# Patient Record
Sex: Female | Born: 1945 | Race: White | Hispanic: No | Marital: Married | Smoking: Former smoker
Health system: Southern US, Community
[De-identification: ages and names within clinical notes are randomized; demographics above are authoritative.]

## PROBLEM LIST (undated history)

## (undated) DIAGNOSIS — M1990 Unspecified osteoarthritis, unspecified site: Secondary | ICD-10-CM

## (undated) DIAGNOSIS — K635 Polyp of colon: Secondary | ICD-10-CM

## (undated) DIAGNOSIS — F32A Depression, unspecified: Secondary | ICD-10-CM

## (undated) DIAGNOSIS — C801 Malignant (primary) neoplasm, unspecified: Secondary | ICD-10-CM

## (undated) DIAGNOSIS — M810 Age-related osteoporosis without current pathological fracture: Secondary | ICD-10-CM

## (undated) DIAGNOSIS — K219 Gastro-esophageal reflux disease without esophagitis: Secondary | ICD-10-CM

## (undated) DIAGNOSIS — R011 Cardiac murmur, unspecified: Secondary | ICD-10-CM

## (undated) DIAGNOSIS — I10 Essential (primary) hypertension: Secondary | ICD-10-CM

## (undated) DIAGNOSIS — E7800 Pure hypercholesterolemia, unspecified: Secondary | ICD-10-CM

## (undated) DIAGNOSIS — G709 Myoneural disorder, unspecified: Secondary | ICD-10-CM

## (undated) DIAGNOSIS — F419 Anxiety disorder, unspecified: Secondary | ICD-10-CM

## (undated) DIAGNOSIS — K589 Irritable bowel syndrome without diarrhea: Secondary | ICD-10-CM

## (undated) DIAGNOSIS — E785 Hyperlipidemia, unspecified: Secondary | ICD-10-CM

## (undated) DIAGNOSIS — N3281 Overactive bladder: Secondary | ICD-10-CM

## (undated) DIAGNOSIS — F329 Major depressive disorder, single episode, unspecified: Secondary | ICD-10-CM

## (undated) DIAGNOSIS — E669 Obesity, unspecified: Secondary | ICD-10-CM

## (undated) DIAGNOSIS — I2510 Atherosclerotic heart disease of native coronary artery without angina pectoris: Secondary | ICD-10-CM

## (undated) DIAGNOSIS — N6019 Diffuse cystic mastopathy of unspecified breast: Secondary | ICD-10-CM

## (undated) DIAGNOSIS — G4730 Sleep apnea, unspecified: Secondary | ICD-10-CM

## (undated) DIAGNOSIS — J449 Chronic obstructive pulmonary disease, unspecified: Secondary | ICD-10-CM

## (undated) HISTORY — PX: OVARIAN CYST SURGERY: SHX726

## (undated) HISTORY — PX: BREAST EXCISIONAL BIOPSY: SUR124

## (undated) HISTORY — PX: APPENDECTOMY: SHX54

## (undated) HISTORY — PX: CHOLECYSTECTOMY: SHX55

## (undated) HISTORY — PX: EYE SURGERY: SHX253

## (undated) HISTORY — PX: FOOT SURGERY: SHX648

## (undated) HISTORY — DX: Chronic obstructive pulmonary disease, unspecified: J44.9

## (undated) HISTORY — PX: ABDOMINAL HYSTERECTOMY: SHX81

---

## 2006-02-19 ENCOUNTER — Ambulatory Visit: Payer: Self-pay | Admitting: Podiatry

## 2006-02-28 ENCOUNTER — Ambulatory Visit: Payer: Self-pay | Admitting: Podiatry

## 2006-02-28 ENCOUNTER — Other Ambulatory Visit: Payer: Self-pay

## 2007-09-21 ENCOUNTER — Other Ambulatory Visit: Payer: Self-pay

## 2007-09-21 ENCOUNTER — Emergency Department: Payer: Self-pay | Admitting: Emergency Medicine

## 2007-09-22 ENCOUNTER — Other Ambulatory Visit: Payer: Self-pay

## 2008-05-21 ENCOUNTER — Ambulatory Visit: Payer: Self-pay

## 2008-12-03 ENCOUNTER — Ambulatory Visit: Payer: Self-pay | Admitting: General Surgery

## 2008-12-10 ENCOUNTER — Ambulatory Visit: Payer: Self-pay | Admitting: General Surgery

## 2009-12-15 ENCOUNTER — Ambulatory Visit: Payer: Self-pay | Admitting: Unknown Physician Specialty

## 2009-12-16 ENCOUNTER — Ambulatory Visit: Payer: Self-pay | Admitting: Internal Medicine

## 2009-12-22 ENCOUNTER — Ambulatory Visit: Payer: Self-pay | Admitting: Internal Medicine

## 2010-08-11 ENCOUNTER — Ambulatory Visit: Payer: Self-pay | Admitting: Cardiology

## 2011-03-01 ENCOUNTER — Ambulatory Visit: Payer: Self-pay | Admitting: Internal Medicine

## 2012-02-27 ENCOUNTER — Ambulatory Visit: Payer: Self-pay | Admitting: Internal Medicine

## 2012-02-27 LAB — CREATININE, SERUM
Creatinine: 0.86 mg/dL (ref 0.60–1.30)
EGFR (African American): >60
EGFR (Non-African Amer.): >60

## 2012-03-13 ENCOUNTER — Ambulatory Visit: Payer: Self-pay | Admitting: Internal Medicine

## 2013-03-21 ENCOUNTER — Ambulatory Visit: Payer: Self-pay | Admitting: Internal Medicine

## 2014-05-05 ENCOUNTER — Ambulatory Visit: Payer: Self-pay | Admitting: Family Medicine

## 2014-06-02 DIAGNOSIS — Z9889 Other specified postprocedural states: Secondary | ICD-10-CM | POA: Insufficient documentation

## 2015-10-22 ENCOUNTER — Emergency Department: Payer: Medicare Other

## 2015-10-22 ENCOUNTER — Encounter: Payer: Self-pay | Admitting: Emergency Medicine

## 2015-10-22 ENCOUNTER — Observation Stay
Admission: EM | Admit: 2015-10-22 | Discharge: 2015-10-23 | Disposition: A | Discharge status: Home / Self Care | Payer: Medicare Other | Attending: Internal Medicine | Admitting: Internal Medicine

## 2015-10-22 DIAGNOSIS — M810 Age-related osteoporosis without current pathological fracture: Secondary | ICD-10-CM | POA: Diagnosis not present

## 2015-10-22 DIAGNOSIS — E7800 Pure hypercholesterolemia, unspecified: Secondary | ICD-10-CM | POA: Diagnosis not present

## 2015-10-22 DIAGNOSIS — I209 Angina pectoris, unspecified: Secondary | ICD-10-CM | POA: Diagnosis present

## 2015-10-22 DIAGNOSIS — Z885 Allergy status to narcotic agent status: Secondary | ICD-10-CM | POA: Diagnosis not present

## 2015-10-22 DIAGNOSIS — Z803 Family history of malignant neoplasm of breast: Secondary | ICD-10-CM | POA: Insufficient documentation

## 2015-10-22 DIAGNOSIS — R072 Precordial pain: Secondary | ICD-10-CM | POA: Diagnosis present

## 2015-10-22 DIAGNOSIS — Z9049 Acquired absence of other specified parts of digestive tract: Secondary | ICD-10-CM | POA: Diagnosis not present

## 2015-10-22 DIAGNOSIS — F32A Depression, unspecified: Secondary | ICD-10-CM | POA: Diagnosis present

## 2015-10-22 DIAGNOSIS — Z8042 Family history of malignant neoplasm of prostate: Secondary | ICD-10-CM | POA: Insufficient documentation

## 2015-10-22 DIAGNOSIS — Z880 Allergy status to penicillin: Secondary | ICD-10-CM | POA: Diagnosis not present

## 2015-10-22 DIAGNOSIS — Z825 Family history of asthma and other chronic lower respiratory diseases: Secondary | ICD-10-CM | POA: Insufficient documentation

## 2015-10-22 DIAGNOSIS — Z79899 Other long term (current) drug therapy: Secondary | ICD-10-CM | POA: Insufficient documentation

## 2015-10-22 DIAGNOSIS — Z8249 Family history of ischemic heart disease and other diseases of the circulatory system: Secondary | ICD-10-CM | POA: Insufficient documentation

## 2015-10-22 DIAGNOSIS — N3281 Overactive bladder: Secondary | ICD-10-CM | POA: Insufficient documentation

## 2015-10-22 DIAGNOSIS — Z90710 Acquired absence of both cervix and uterus: Secondary | ICD-10-CM | POA: Insufficient documentation

## 2015-10-22 DIAGNOSIS — G4730 Sleep apnea, unspecified: Secondary | ICD-10-CM | POA: Diagnosis not present

## 2015-10-22 DIAGNOSIS — K219 Gastro-esophageal reflux disease without esophagitis: Secondary | ICD-10-CM | POA: Diagnosis not present

## 2015-10-22 DIAGNOSIS — K589 Irritable bowel syndrome without diarrhea: Secondary | ICD-10-CM | POA: Insufficient documentation

## 2015-10-22 DIAGNOSIS — Z882 Allergy status to sulfonamides status: Secondary | ICD-10-CM | POA: Diagnosis not present

## 2015-10-22 DIAGNOSIS — Z823 Family history of stroke: Secondary | ICD-10-CM | POA: Diagnosis not present

## 2015-10-22 DIAGNOSIS — Z7982 Long term (current) use of aspirin: Secondary | ICD-10-CM | POA: Diagnosis not present

## 2015-10-22 DIAGNOSIS — I25119 Atherosclerotic heart disease of native coronary artery with unspecified angina pectoris: Secondary | ICD-10-CM | POA: Insufficient documentation

## 2015-10-22 DIAGNOSIS — Z800 Family history of malignant neoplasm of digestive organs: Secondary | ICD-10-CM | POA: Diagnosis not present

## 2015-10-22 DIAGNOSIS — I2510 Atherosclerotic heart disease of native coronary artery without angina pectoris: Secondary | ICD-10-CM | POA: Diagnosis present

## 2015-10-22 DIAGNOSIS — F419 Anxiety disorder, unspecified: Secondary | ICD-10-CM | POA: Insufficient documentation

## 2015-10-22 DIAGNOSIS — I10 Essential (primary) hypertension: Secondary | ICD-10-CM | POA: Insufficient documentation

## 2015-10-22 DIAGNOSIS — Z87891 Personal history of nicotine dependence: Secondary | ICD-10-CM | POA: Diagnosis not present

## 2015-10-22 DIAGNOSIS — F329 Major depressive disorder, single episode, unspecified: Secondary | ICD-10-CM | POA: Diagnosis not present

## 2015-10-22 HISTORY — DX: Gastro-esophageal reflux disease without esophagitis: K21.9

## 2015-10-22 HISTORY — DX: Pure hypercholesterolemia, unspecified: E78.00

## 2015-10-22 HISTORY — DX: Anxiety disorder, unspecified: F41.9

## 2015-10-22 HISTORY — DX: Essential (primary) hypertension: I10

## 2015-10-22 HISTORY — DX: Age-related osteoporosis without current pathological fracture: M81.0

## 2015-10-22 HISTORY — DX: Irritable bowel syndrome, unspecified: K58.9

## 2015-10-22 HISTORY — DX: Atherosclerotic heart disease of native coronary artery without angina pectoris: I25.10

## 2015-10-22 HISTORY — DX: Overactive bladder: N32.81

## 2015-10-22 HISTORY — DX: Major depressive disorder, single episode, unspecified: F32.9

## 2015-10-22 HISTORY — DX: Sleep apnea, unspecified: G47.30

## 2015-10-22 HISTORY — DX: Depression, unspecified: F32.A

## 2015-10-22 LAB — TROPONIN I

## 2015-10-22 LAB — CBC
HCT: 39.3 % (ref 35.0–47.0)
Hemoglobin: 13 g/dL (ref 12.0–16.0)
MCH: 29.5 pg (ref 26.0–34.0)
MCHC: 33.1 g/dL (ref 32.0–36.0)
MCV: 89.2 fL (ref 80.0–100.0)
Platelets: 215 10*3/uL (ref 150–440)
RBC: 4.4 MIL/uL (ref 3.80–5.20)
RDW: 13 % (ref 11.5–14.5)
WBC: 11.4 10*3/uL — ABNORMAL HIGH (ref 3.6–11.0)

## 2015-10-22 LAB — BASIC METABOLIC PANEL
Anion gap: 9 (ref 5–15)
BUN: 14 mg/dL (ref 6–20)
CO2: 24 mmol/L (ref 22–32)
Calcium: 9 mg/dL (ref 8.9–10.3)
Chloride: 107 mmol/L (ref 101–111)
Creatinine, Ser: 0.87 mg/dL (ref 0.44–1.00)
GFR calc Af Amer: >60 mL/min (ref >60)
GFR calc non Af Amer: >60 mL/min (ref >60)
Glucose, Bld: 107 mg/dL — ABNORMAL HIGH (ref 65–99)
Potassium: 3.9 mmol/L (ref 3.5–5.1)
Sodium: 140 mmol/L (ref 135–145)

## 2015-10-22 LAB — FIBRIN DERIVATIVES D-DIMER (ARMC ONLY): Fibrin derivatives D-dimer (ARMC): 320 (ref 0–499)

## 2015-10-22 NOTE — ED Notes (Signed)
pt reports sudden substernal chest pain with shortness of breath currently pain free took 324 ASA and 150 zantac prior to arrival.

## 2015-10-22 NOTE — H&P (Signed)
[facility] [place] NAME: Marcia Livingston    MR#:  [id number]  DATE OF BIRTH:  May 22, 1946  DATE OF ADMISSION:  10/22/2015  PRIMARY CARE PHYSICIAN: Gearldine Shown, DO   REQUESTING/REFERRING PHYSICIAN: Cinda Quest, MD  CHIEF COMPLAINT:   Chief Complaint  Patient presents with  . Chest Pain    pt reports sudden substernal chest pain with shortness of breath currently pain free took 324 ASA and 150 zantac prior to arrival.     HISTORY OF PRESENT ILLNESS:  Marcia Livingston  is a 70 y.o. female who presents with angina. Patient has a known history of CAD with prior cardiac catheterization showing nonobstructive plaque per her report. She does not remember further details beyond this. Her cardiologist is Dr. Saralyn Pilar.  She has had multiple stress tests in the past. She reports no history of prior heart attack. However, tonight she describes an episode of acute angina, characterizes chest tightness, centrally located, radiating to her right shoulder, which lasted for about 20 minutes. Lab workup here including initial troponin is largely negative/within normal limits. EKG did not show any ischemic changes. Hospitalists were called for evaluation and admission.  PAST MEDICAL HISTORY:   Past Medical History  Diagnosis Date  . Sleep apnea   . Hypertension   . High cholesterol   . Osteoporosis   . Depression   . Anxiety   . GERD (gastroesophageal reflux disease)   . IBS (irritable bowel syndrome)   . Overactive bladder   . CAD (coronary artery disease)     PAST SURGICAL HISTORY:   Past Surgical History  Procedure Laterality Date  . Abdominal hysterectomy    . Cholecystectomy    . Foot surgery Left     SOCIAL HISTORY:   Social History  Substance Use Topics  . Smoking status: Former Research scientist (life sciences)  . Smokeless tobacco: Not on file  . Alcohol Use: No    FAMILY HISTORY:   Family History  Problem Relation Age of Onset  . Colon cancer  Mother   . Stroke Mother   . Hypertension Mother   . Heart attack Mother   . Breast cancer Mother   . Emphysema Father   . Prostate cancer Father     DRUG ALLERGIES:   Allergies  Allergen Reactions  . Penicillins Rash    Has patient had a PCN reaction causing immediate rash, facial/tongue/throat swelling, SOB or lightheadedness with hypotension: [id number] Has patient had a PCN reaction causing severe rash involving mucus membranes or skin necrosis: no:[id number]} Has patient had a PCN reaction that required hospitalization no:[id number]} Has patient had a PCN reaction occurring within the last 10 years: no:[id number]} If all of the above answers are "NO", then may proceed with Cephalosporin use.   . Codeine Hives  . Sulfa Antibiotics Hives    MEDICATIONS AT HOME:   Prior to Admission medications   Medication Sig Start Date End Date Taking? Authorizing Provider  ALPRAZolam Duanne Moron) 0.25 MG tablet Take 0.25 mg by mouth at bedtime as needed for anxiety.   Yes Historical Provider, MD  aspirin EC 81 MG tablet Take 81 mg by mouth daily.   Yes Historical Provider, MD  buPROPion (WELLBUTRIN XL) 150 MG 24 hr tablet Take 150 mg by mouth daily.   Yes Historical Provider, MD  Cholecalciferol 10000 units CAPS Take 1 capsule by mouth daily.   Yes Historical Provider, MD  colestipol (COLESTID) 1 g tablet Take 2 g by mouth 2 (  two) times daily.   Yes Historical Provider, MD  escitalopram (LEXAPRO) 20 MG tablet Take 20 mg by mouth daily.   Yes Historical Provider, MD  hydrochlorothiazide (HYDRODIURIL) 25 MG tablet Take 25 mg by mouth daily.   Yes Historical Provider, MD  ranitidine (ZANTAC) 150 MG tablet Take 150 mg by mouth 2 (two) times daily.   Yes Historical Provider, MD  rosuvastatin (CRESTOR) 5 MG tablet Take 5 mg by mouth 3 (three) times a week.   Yes Historical Provider, MD    REVIEW OF SYSTEMS:  Review of Systems  Constitutional: Negative for fever, chills, weight loss and  malaise/fatigue.  HENT: Negative for ear pain, hearing loss and tinnitus.   Eyes: Negative for blurred vision, double vision, pain and redness.  Respiratory: Negative for cough, hemoptysis and shortness of breath.   Cardiovascular: Positive for chest pain. Negative for palpitations, orthopnea and leg swelling.  Gastrointestinal: Negative for nausea, vomiting, abdominal pain, diarrhea and constipation.  Genitourinary: Negative for dysuria, frequency and hematuria.  Musculoskeletal: Negative for back pain, joint pain and neck pain.  Skin:       No acne, rash, or lesions  Neurological: Negative for dizziness, tremors, focal weakness and weakness.  Endo/Heme/Allergies: Negative for polydipsia. Does not bruise/bleed easily.  Psychiatric/Behavioral: Negative for depression. The patient is not nervous/anxious and does not have insomnia.      VITAL SIGNS:   Filed Vitals:   10/22/15 2100 10/22/15 2130 10/22/15 2200 10/22/15 2230  BP: 132/55 140/49 130/57 131/50  Pulse: 63 58 61 60  Temp:      TempSrc:      Resp: 17 17 17 14   Height:      Weight:      SpO2: 98% 95% 97% 94%   Wt Readings from Last 3 Encounters:  10/22/15 94.348 kg (208 lb)    PHYSICAL EXAMINATION:  Physical Exam  Vitals reviewed. Constitutional: She is oriented to person, place, and time. She appears well-developed and well-nourished. No distress.  HENT:  Head: Normocephalic and atraumatic.  Mouth/Throat: Oropharynx is clear and moist.  Eyes: Conjunctivae and EOM are normal. Pupils are equal, round, and reactive to light. No scleral icterus.  Neck: Normal range of motion. Neck supple. No JVD present. No thyromegaly present.  Cardiovascular: Normal rate, regular rhythm and intact distal pulses.  Exam reveals no gallop and no friction rub.   No murmur heard. Respiratory: Effort normal and breath sounds normal. No respiratory distress. She has no wheezes. She has no rales.  GI: Soft. Bowel sounds are normal. She  exhibits no distension. There is no tenderness.  Musculoskeletal: Normal range of motion. She exhibits no edema.  No arthritis, no gout  Lymphadenopathy:    She has no cervical adenopathy.  Neurological: She is alert and oriented to person, place, and time. No cranial nerve deficit.  No dysarthria, no aphasia  Skin: Skin is warm and dry. No rash noted. No erythema.  Psychiatric: She has a normal mood and affect. Her behavior is normal. Judgment and thought content normal.    LABORATORY PANEL:   CBC  Recent Labs Lab 10/22/15 1951  WBC 11.4*  HGB 13.0  HCT 39.3  PLT 215   ------------------------------------------------------------------------------------------------------------------  Chemistries   Recent Labs Lab 10/22/15 1951  NA 140  K 3.9  CL 107  CO2 24  GLUCOSE 107*  BUN 14  CREATININE 0.87  CALCIUM 9.0   ------------------------------------------------------------------------------------------------------------------  Cardiac Enzymes  Recent Labs Lab 10/22/15 1951  TROPONINI <0.03   ------------------------------------------------------------------------------------------------------------------  RADIOLOGY:  Dg Chest 2 View  10/22/2015  CLINICAL DATA:  Sudden substernal chest pain EXAM: CHEST  2 VIEW COMPARISON:  03/01/2006 FINDINGS: The heart size and mediastinal contours are within normal limits. Both lungs are clear. The visualized skeletal structures are unremarkable. IMPRESSION: No active cardiopulmonary disease. Electronically Signed   By: Skipper Cliche M.D.   On: 10/22/2015 20:16    EKG:   Orders placed or performed during the hospital encounter of 10/22/15  . EKG 12-Lead  . EKG 12-Lead  . ED EKG within 10 minutes  . ED EKG within 10 minutes    IMPRESSION AND PLAN:  Principal Problem:   Angina pectoris (Early) - with some aspects similar to her typical cardiac pain. Patient has a known history of CAD. Her cardiologist is Dr. Saralyn Pilar.  Cardiac enzymes initially negative. Chest pain has resolved. We will check serial cardiac enzymes tonight, and consult her cardiologist for further recommendations. Active Problems:   HTN (hypertension) - at goal here, continue home antihypertensives.   Anxiety - continue home meds   CAD (coronary artery disease) - continue home meds, defer to cardiology's recommendations for further workup/treatment.   High cholesterol - continue home meds   Depression - continue home meds  All the records are reviewed and case discussed with ED provider. Management plans discussed with the patient and/or family.  DVT PROPHYLAXIS: SubQ lovenox  GI PROPHYLAXIS: H2 blocker  ADMISSION STATUS: Observation  CODE STATUS: Full Advance Directive Documentation        Most Recent Value   Type of Advance Directive  Healthcare Power of Attorney   Pre-existing out of facility DNR order (yellow form or pink MOST form)     "MOST" Form in Place?        TOTAL TIME TAKING CARE OF THIS PATIENT: 40 minutes.    Audra Kagel [place] 10/22/2015, 11:38 PM  [facility]  Office  [phone]  CC: Primary care physician; Arrie Aran PATRICIA, DO

## 2015-10-22 NOTE — ED Provider Notes (Signed)
[facility] Emergency Department Provider Note  ____________________________________________  Time seen: Approximately 8:54 PM  I have reviewed the triage vital signs and the nursing notes.   HISTORY  Chief Complaint Chest Pain    HPI Marcia Livingston is a 70 y.o. female patient reports she had bent over to move some magazines to by the hearth when she developed sudden onset of severe crushing chest pain with some shortness of breath. She did not have any nausea with it or sweating but the chest pain was intense. It lasted about 20 minutes. Patient reports her chest still feels different but it really doesn't hurt like that anymore. Nothing she did seem to make it better or worse. Patient took 325 aspirin at home. Patient has never had anything like this before.   Past Medical History  Diagnosis Date  . Sleep apnea   . Hypertension   . High cholesterol   . Osteoporosis    Patient's family reports patient had a cath with [facility] in 2012 or 13 and that she remembers that showed a 60% blockage of view of the RCA or LAD. There are no active problems to display for this patient.   Past Surgical History  Procedure Laterality Date  . Abdominal hysterectomy    . Cholecystectomy    . Foot surgery Left     No current outpatient prescriptions on file.  Allergies Penicillins; Codeine; and Sulfa antibiotics  No family history on file. Patient's mother had a CABG and an MI in her 54s. She was diabetic. Social History Social History  Substance Use Topics  . Smoking status: Former Research scientist (life sciences)  . Smokeless tobacco: Not on file  . Alcohol Use: No    Review of Systems Constitutional: No fever/chills Eyes: No visual changes. ENT: No sore throat. Cardiovascular: See history of present illness Respiratory: See history of present illness Gastrointestinal: No abdominal pain.  No nausea, no vomiting.  No diarrhea.  No constipation. Genitourinary:  Negative for dysuria. Musculoskeletal: Negative for back pain. Skin: Negative for rash. Neurological: Negative for headaches, focal weakness or numbness.  10-point ROS otherwise negative.  ____________________________________________   PHYSICAL EXAM:  VITAL SIGNS: ED Triage Vitals  Enc Vitals Group     BP 10/22/15 1942 150/53 mmHg     Pulse Rate 10/22/15 1942 71     Resp 10/22/15 1942 20     Temp 10/22/15 1942 98.3 F (36.8 C)     Temp Source 10/22/15 1942 Oral     SpO2 10/22/15 1942 96 %     Weight 10/22/15 1942 208 lb (94.348 kg)     Height 10/22/15 1942 5\' 2"  (1.575 m)     Head Cir --      Peak Flow --      Pain Score --      Pain Loc --      Pain Edu? --      Excl. in [place]? --     Constitutional: Alert and oriented. Well appearing and in no acute distress. Eyes: Conjunctivae are normal. PERRL. EOMI. Head: Atraumatic. Nose: No congestion/rhinnorhea. Mouth/Throat: Mucous membranes are moist.  Oropharynx non-erythematous. Neck: No stridor.   Cardiovascular: Normal rate, regular rhythm. Grossly normal heart sounds.  Good peripheral circulation. Respiratory: Normal respiratory effort.  No retractions. Lungs CTAB. Gastrointestinal: Soft and nontender. No distention. No abdominal bruits. No CVA tenderness. Musculoskeletal: No lower extremity tenderness trace edema bilaterally.  No joint effusions. Neurologic:  Normal speech and language. No gross focal  neurologic deficits are appreciated. No gait instability. Skin:  Skin is warm, dry and intact. No rash noted. Psychiatric: Mood and affect are normal. Speech and behavior are normal.  ____________________________________________   LABS (all labs ordered are listed, but only abnormal results are displayed)  Labs Reviewed  BASIC METABOLIC PANEL - Abnormal; Notable for the following:    Glucose, Bld 107 (*)    All other components within normal limits  CBC - Abnormal; Notable for the following:    WBC 11.4 (*)    All  other components within normal limits  TROPONIN I  FIBRIN DERIVATIVES D-DIMER ([facility] ONLY)  TROPONIN I   ____________________________________________  EKG  EKG read and interpreted by me shows normal sinus rhythm a rate of 72 normal axis looks slightly better than immediately prior EKG from 2008. ____________________________________________  RADIOLOGY  Chest x-ray read by radiology as normal ____________________________________________   PROCEDURES   ____________________________________________   INITIAL IMPRESSION / ASSESSMENT AND PLAN / ED COURSE  Pertinent labs & imaging results that were available during my care of the patient were reviewed by me and considered in my medical decision making (see chart for details).  Patient is old obese has high cholesterol has a positive family history and hypertension. The weather is getting worse outside and patient may not be O to get back if she gets sicker. I will admit her to the hospital ____________________________________________   FINAL CLINICAL IMPRESSION(S) / ED DIAGNOSES  Final diagnoses:  Precordial pain      Nena Polio, MD 10/22/15 2224

## 2015-10-23 DIAGNOSIS — R072 Precordial pain: Secondary | ICD-10-CM | POA: Diagnosis not present

## 2015-10-23 LAB — BASIC METABOLIC PANEL
Anion gap: 8 (ref 5–15)
BUN: 17 mg/dL (ref 6–20)
CHLORIDE: 106 mmol/L (ref 101–111)
CO2: 27 mmol/L (ref 22–32)
CREATININE: 0.91 mg/dL (ref 0.44–1.00)
Calcium: 9.1 mg/dL (ref 8.9–10.3)
GFR calc non Af Amer: >60 mL/min (ref >60)
GLUCOSE: 95 mg/dL (ref 65–99)
Potassium: 3.5 mmol/L (ref 3.5–5.1)
Sodium: 141 mmol/L (ref 135–145)

## 2015-10-23 LAB — CBC
HCT: 37.9 % (ref 35.0–47.0)
Hemoglobin: 12.7 g/dL (ref 12.0–16.0)
MCH: 29.9 pg (ref 26.0–34.0)
MCHC: 33.4 g/dL (ref 32.0–36.0)
MCV: 89.4 fL (ref 80.0–100.0)
PLATELETS: 202 10*3/uL (ref 150–440)
RBC: 4.24 MIL/uL (ref 3.80–5.20)
RDW: 13.1 % (ref 11.5–14.5)
WBC: 11.6 10*3/uL — ABNORMAL HIGH (ref 3.6–11.0)

## 2015-10-23 LAB — TROPONIN I: Troponin I: <0.03 ng/mL (ref <0.031)

## 2015-10-23 MED ORDER — ASPIRIN EC 81 MG PO TBEC
81.0000 mg | DELAYED_RELEASE_TABLET | Freq: Every day | ORAL | Status: DC
  Administered 2015-10-23: 81 mg via ORAL
  Filled 2015-10-23: qty 1

## 2015-10-23 MED ORDER — ONDANSETRON HCL 4 MG/2ML IJ SOLN: 4.0000 mg | Freq: Four times a day (QID) | INTRAMUSCULAR | Status: DC | PRN

## 2015-10-23 MED ORDER — ACETAMINOPHEN 325 MG PO TABS: 650.0000 mg | ORAL_TABLET | Freq: Four times a day (QID) | ORAL | Status: DC | PRN

## 2015-10-23 MED ORDER — ESCITALOPRAM OXALATE 10 MG PO TABS
20.0000 mg | ORAL_TABLET | Freq: Every day | ORAL | Status: DC
  Administered 2015-10-23: 20 mg via ORAL
  Filled 2015-10-23: qty 2

## 2015-10-23 MED ORDER — ROSUVASTATIN CALCIUM 10 MG PO TABS: 5.0000 mg | ORAL_TABLET | ORAL | Status: DC

## 2015-10-23 MED ORDER — ENOXAPARIN SODIUM 40 MG/0.4ML ~~LOC~~ SOLN
40.0000 mg | Freq: Every day | SUBCUTANEOUS | Status: DC
  Administered 2015-10-23: 40 mg via SUBCUTANEOUS
  Filled 2015-10-23: qty 0.4

## 2015-10-23 MED ORDER — FAMOTIDINE 20 MG PO TABS
20.0000 mg | ORAL_TABLET | Freq: Two times a day (BID) | ORAL | Status: DC
  Administered 2015-10-23 (×2): 20 mg via ORAL
  Filled 2015-10-23 (×2): qty 1

## 2015-10-23 MED ORDER — ALPRAZOLAM 0.25 MG PO TABS: 0.2500 mg | ORAL_TABLET | Freq: Every evening | ORAL | Status: DC | PRN

## 2015-10-23 MED ORDER — ONDANSETRON HCL 4 MG PO TABS: 4.0000 mg | ORAL_TABLET | Freq: Four times a day (QID) | ORAL | Status: DC | PRN

## 2015-10-23 MED ORDER — BUPROPION HCL ER (XL) 150 MG PO TB24
150.0000 mg | ORAL_TABLET | Freq: Every day | ORAL | Status: DC
  Administered 2015-10-23: 150 mg via ORAL
  Filled 2015-10-23: qty 1

## 2015-10-23 MED ORDER — SODIUM CHLORIDE 0.9 % IJ SOLN
3.0000 mL | Freq: Two times a day (BID) | INTRAMUSCULAR | Status: DC
  Administered 2015-10-23 (×2): 3 mL via INTRAVENOUS

## 2015-10-23 MED ORDER — ACETAMINOPHEN 650 MG RE SUPP: 650.0000 mg | Freq: Four times a day (QID) | RECTAL | Status: DC | PRN

## 2015-10-23 MED ORDER — HYDROCHLOROTHIAZIDE 25 MG PO TABS
25.0000 mg | ORAL_TABLET | Freq: Every day | ORAL | Status: DC
  Administered 2015-10-23: 25 mg via ORAL
  Filled 2015-10-23: qty 1

## 2015-10-23 NOTE — Plan of Care (Signed)
Problem: Consults Goal: Chest Pain Patient Education (See Patient Education module for education specifics.) Outcome: Completed/Met Date Met:  10/23/15 Hands given to patient and family

## 2015-10-23 NOTE — Progress Notes (Signed)
Pt discharged to home w family, no chest pain since admission, pt ambulated around nurses' station multiple times w/ no s/sx distress.  IV site DCd bleeding controlled, tele monitor turned in.  Left hospital in car with her family

## 2015-10-23 NOTE — Care Management Obs Status (Signed)
[facility] NOTIFICATION   Patient Details  Name: Marcia Livingston MRN: [id number] Date of Birth: 1946-05-19   Medicare Observation Status Notification Given:  No (In house less than 24 hours)    Ival Bible, RN 10/23/2015, 3:59 PM

## 2015-10-23 NOTE — Consult Note (Signed)
[facility] Cardiology  CARDIOLOGY CONSULT NOTE  Patient ID: Marcia Livingston MRN: [id number] DOB/AGE: [birth year] 71 y.o.  Admit date: 10/22/2015 Referring Physician Anselm Jungling Primary Physician [place] Primary Cardiologist Foday [facility] Reason for Consultation chest pain  HPI: 70 year old female referred for evaluation chest pain. The patient is undergone previous cardiac catheterization which revealed insignificant coronary artery disease. She was in his usual state of health until last evening she developed substernal chest pain described as tightness and pressure without nausea, vomiting, diaphoresis or radiation. Family members brought her to [facility] emergency room. Chest pain resolved by time she reached the emergency room. EKG was nondiagnostic. The patient has ruled out for myocardial infarctions by CPK isoenzymes and troponin. She has had no recurrent chest pain.  Review of systems complete and found to be negative unless listed above     Past Medical History  Diagnosis Date  . Sleep apnea   . Hypertension   . High cholesterol   . Osteoporosis   . Depression   . Anxiety   . GERD (gastroesophageal reflux disease)   . IBS (irritable bowel syndrome)   . Overactive bladder   . CAD (coronary artery disease)     Past Surgical History  Procedure Laterality Date  . Abdominal hysterectomy    . Cholecystectomy    . Foot surgery Left     Prescriptions prior to admission  Medication Sig Dispense Refill Last Dose  . ALPRAZolam (XANAX) 0.25 MG tablet Take 0.25 mg by mouth at bedtime as needed for anxiety.   10/22/2015 at Unknown time  . aspirin EC 81 MG tablet Take 81 mg by mouth daily.   10/22/2015 at 0900  . buPROPion (WELLBUTRIN XL) 150 MG 24 hr tablet Take 150 mg by mouth daily.   10/22/2015 at Unknown time  . Cholecalciferol 10000 units CAPS Take 1 capsule by mouth daily.   10/22/2015 at Unknown time  . colestipol (COLESTID) 1 g tablet Take 2 g by mouth 2 (two) times daily.   10/22/2015 at Unknown time  .  escitalopram (LEXAPRO) 20 MG tablet Take 20 mg by mouth daily.   10/22/2015 at Unknown time  . hydrochlorothiazide (HYDRODIURIL) 25 MG tablet Take 25 mg by mouth daily.   10/22/2015 at Unknown time  . ranitidine (ZANTAC) 150 MG tablet Take 150 mg by mouth 2 (two) times daily.   10/22/2015 at Unknown time  . rosuvastatin (CRESTOR) 5 MG tablet Take 5 mg by mouth 3 (three) times a week.   Past Week at Unknown time   Social History   Social History  . Marital Status: Married    Spouse Name: N/A  . Number of Children: N/A  . Years of Education: N/A   Occupational History  . Not on file.   Social History Main Topics  . Smoking status: Former Research scientist (life sciences)  . Smokeless tobacco: Not on file  . Alcohol Use: No  . Drug Use: No  . Sexual Activity: Not on file   Other Topics Concern  . Not on file   Social History Narrative    Family History  Problem Relation Age of Onset  . Colon cancer Mother   . Stroke Mother   . Hypertension Mother   . Heart attack Mother   . Breast cancer Mother   . Emphysema Father   . Prostate cancer Father       Review of systems complete and found to be negative unless listed above      PHYSICAL EXAM  General: Well developed,  well nourished, in no acute distress HEENT:  Normocephalic and atramatic Neck:  No JVD.  Lungs: Clear bilaterally to auscultation and percussion. Heart: HRRR . Normal S1 and S2 without gallops or murmurs.  Abdomen: Bowel sounds are positive, abdomen soft and non-tender  Msk:  Back normal, normal gait. Normal strength and tone for age. Extremities: No clubbing, cyanosis or edema.   Neuro: Alert and oriented X 3. Psych:  Good affect, responds appropriately  Labs:   Lab Results  Component Value Date   WBC 11.6* 10/23/2015   HGB 12.7 10/23/2015   HCT 37.9 10/23/2015   MCV 89.4 10/23/2015   PLT 202 10/23/2015    Recent Labs Lab 10/23/15 0145  NA 141  K 3.5  CL 106  CO2 27  BUN 17  CREATININE 0.91  CALCIUM 9.1  GLUCOSE 95    Lab Results  Component Value Date   TROPONINI <0.03 10/23/2015   No results found for: CHOL No results found for: HDL No results found for: LDLCALC No results found for: TRIG No results found for: CHOLHDL No results found for: LDLDIRECT    Radiology: Dg Chest 2 View  10/22/2015  CLINICAL DATA:  Sudden substernal chest pain EXAM: CHEST  2 VIEW COMPARISON:  03/01/2006 FINDINGS: The heart size and mediastinal contours are within normal limits. Both lungs are clear. The visualized skeletal structures are unremarkable. IMPRESSION: No active cardiopulmonary disease. Electronically Signed   By: Skipper Cliche M.D.   On: 10/22/2015 20:16    EKG: Normal sinus rhythm  ASSESSMENT AND PLAN:   1. Chest pain, normal ECG, negative troponin. The patient's had no recurrent chest pain following short episode.  Recommendations  1. Defer full dose anticoagulation 2. Ambulate patient, if she does well, consider discharge and outpatient nuclear study  Signed: Birt Reinoso MD,PhD, [facility] 10/23/2015, 9:01 AM

## 2015-10-23 NOTE — Discharge Summary (Signed)
[facility] NAME: Marcia Livingston    MR#:  [id number]  DATE OF BIRTH:  [birth year]  DATE OF ADMISSION:  10/22/2015 ADMITTING PHYSICIAN: Lance Coon, MD  DATE OF DISCHARGE: 10/23/2015  PRIMARY CARE PHYSICIAN: Arrie Aran PATRICIA, DO    ADMISSION DIAGNOSIS:  Precordial pain [R07.2]  DISCHARGE DIAGNOSIS:  Principal Problem:   Angina pectoris (HCC) Active Problems:   HTN (hypertension)   High cholesterol   Anxiety   Depression   CAD (coronary artery disease)   SECONDARY DIAGNOSIS:   Past Medical History  Diagnosis Date  . Sleep apnea   . Hypertension   . High cholesterol   . Osteoporosis   . Depression   . Anxiety   . GERD (gastroesophageal reflux disease)   . IBS (irritable bowel syndrome)   . Overactive bladder   . CAD (coronary artery disease)     HOSPITAL COURSE:   Admitted with anginal chest pain, monitored on telemetry , and followed serial troponin- remained stable. Seen by cardiologist- he suggested out pt follow up and stress test, no further work ups inpatient.  Other medical issues remained stable in hospital.  DISCHARGE CONDITIONS:   Stable.  CONSULTS OBTAINED:  Treatment Team:  Isaias Cowman, MD  DRUG ALLERGIES:   Allergies  Allergen Reactions  . Penicillins Rash    Has patient had a PCN reaction causing immediate rash, facial/tongue/throat swelling, SOB or lightheadedness with hypotension: [id number] Has patient had a PCN reaction causing severe rash involving mucus membranes or skin necrosis: no:[id number]} Has patient had a PCN reaction that required hospitalization no:[id number]} Has patient had a PCN reaction occurring within the last 10 years: no:[id number]} If all of the above answers are "NO", then may proceed with Cephalosporin use.   . Codeine Hives  . Sulfa Antibiotics Hives    DISCHARGE MEDICATIONS:   Current Discharge Medication List    CONTINUE these  medications which have NOT CHANGED   Details  ALPRAZolam (XANAX) 0.25 MG tablet Take 0.25 mg by mouth at bedtime as needed for anxiety.    aspirin EC 81 MG tablet Take 81 mg by mouth daily.    buPROPion (WELLBUTRIN XL) 150 MG 24 hr tablet Take 150 mg by mouth daily.    Cholecalciferol 10000 units CAPS Take 1 capsule by mouth daily.    colestipol (COLESTID) 1 g tablet Take 2 g by mouth 2 (two) times daily.    escitalopram (LEXAPRO) 20 MG tablet Take 20 mg by mouth daily.    hydrochlorothiazide (HYDRODIURIL) 25 MG tablet Take 25 mg by mouth daily.    ranitidine (ZANTAC) 150 MG tablet Take 150 mg by mouth 2 (two) times daily.    rosuvastatin (CRESTOR) 5 MG tablet Take 5 mg by mouth 3 (three) times a week.         DISCHARGE INSTRUCTIONS:    Follow with cardiologist office.  If you experience worsening of your admission symptoms, develop shortness of breath, life threatening emergency, suicidal or homicidal thoughts you must seek medical attention immediately by calling 911 or calling your MD immediately  if symptoms less severe.  You Must read complete instructions/literature along with all the possible adverse reactions/side effects for all the Medicines you take and that have been prescribed to you. Take any new Medicines after you have completely understood and accept all the possible adverse reactions/side effects.   Please note  You were cared for by a hospitalist during your hospital stay. If you  have any questions about your discharge medications or the care you received while you were in the hospital after you are discharged, you can call the unit and asked to speak with the hospitalist on call if the hospitalist that took care of you is not available. Once you are discharged, your primary care physician will handle any further medical issues. Please note that NO REFILLS for any discharge medications will be authorized once you are discharged, as it is imperative that you  return to your primary care physician (or establish a relationship with a primary care physician if you do not have one) for your aftercare needs so that they can reassess your need for medications and monitor your lab values.    Today   CHIEF COMPLAINT:   Chief Complaint  Patient presents with  . Chest Pain    pt reports sudden substernal chest pain with shortness of breath currently pain free took 324 ASA and 150 zantac prior to arrival.     HISTORY OF PRESENT ILLNESS:  Marcia Livingston  is a 70 y.o. female presents with angina. Patient has a known history of CAD with prior cardiac catheterization showing nonobstructive plaque per her report. She does not remember further details beyond this. Her cardiologist is Dr. Saralyn Pilar. She has had multiple stress tests in the past. She reports no history of prior heart attack. However, tonight she describes an episode of acute angina, characterizes chest tightness, centrally located, radiating to her right shoulder, which lasted for about 20 minutes. Lab workup here including initial troponin is largely negative/within normal limits. EKG did not show any ischemic changes. Hospitalists were called for evaluation and admission.  VITAL SIGNS:  Blood pressure 155/58, pulse 70, temperature 98.2 F (36.8 C), temperature source Oral, resp. rate 19, height 5\' 2"  (1.575 m), weight 94.348 kg (208 lb), SpO2 97 %.  I/O:   Intake/Output Summary (Last 24 hours) at 10/23/15 1217 Last data filed at 10/23/15 1012  Gross per 24 hour  Intake    120 ml  Output    900 ml  Net   -780 ml    PHYSICAL EXAMINATION:   Constitutional: She is oriented to person, place, and time. She appears well-developed and well-nourished. No distress.  HENT:  Head: Normocephalic and atraumatic.  Mouth/Throat: Oropharynx is clear and moist.  Eyes: Conjunctivae and EOM are normal. Pupils are equal, round, and reactive to light. No scleral icterus.  Neck: Normal range of motion. Neck  supple. No JVD present. No thyromegaly present.  Cardiovascular: Normal rate, regular rhythm and intact distal pulses. Exam reveals no gallop and no friction rub.  No murmur heard. Respiratory: Effort normal and breath sounds normal. No respiratory distress. She has no wheezes. She has no rales.  GI: Soft. Bowel sounds are normal. She exhibits no distension. There is no tenderness.  Musculoskeletal: Normal range of motion. She exhibits no edema.  No arthritis, no gout  Lymphadenopathy:   She has no cervical adenopathy.  Neurological: She is alert and oriented to person, place, and time. No cranial nerve deficit.  No dysarthria, no aphasia  Skin: Skin is warm and dry. No rash noted. No erythema.  Psychiatric: She has a normal mood and affect. Her behavior is normal. Judgment and thought content normal.   DATA REVIEW:   CBC  Recent Labs Lab 10/23/15 0145  WBC 11.6*  HGB 12.7  HCT 37.9  PLT 202    Chemistries   Recent Labs Lab 10/23/15 0145  NA 141  K 3.5  CL 106  CO2 27  GLUCOSE 95  BUN 17  CREATININE 0.91  CALCIUM 9.1    Cardiac Enzymes  Recent Labs Lab 10/23/15 0621  TROPONINI <0.03    Microbiology Results  No results found for this or any previous visit.  RADIOLOGY:  Dg Chest 2 View  10/22/2015  CLINICAL DATA:  Sudden substernal chest pain EXAM: CHEST  2 VIEW COMPARISON:  03/01/2006 FINDINGS: The heart size and mediastinal contours are within normal limits. Both lungs are clear. The visualized skeletal structures are unremarkable. IMPRESSION: No active cardiopulmonary disease. Electronically Signed   By: Skipper Cliche M.D.   On: 10/22/2015 20:16     Management plans discussed with the patient, family and they are in agreement.  CODE STATUS:     Code Status Orders        Start     Ordered   10/23/15 0103  Full code   Continuous     10/23/15 0102    Advance Directive Documentation        Most Recent Value   Type of Advance Directive   Healthcare Power of Attorney   Pre-existing out of facility DNR order (yellow form or pink MOST form)     "MOST" Form in Place?        TOTAL TIME TAKING CARE OF THIS PATIENT: 35 minutes.    Vaughan Basta M.D on 10/23/2015 at 12:17 PM  Between 7am to 6pm - Pager - [phone]  After 6pm go to [email or web address] - password [email or web address] [facility]  Office  [phone]  CC: Primary care physician; Arrie Aran PATRICIA, DO   Note: This dictation was prepared with Dragon dictation along with smaller phrase technology. Any transcriptional errors that result from this process are unintentional.

## 2015-10-23 NOTE — Progress Notes (Signed)
Per Dr Saralyn Pilar pt may eat breakfast; if no further CP w/ activity this AM she may discharge later today

## 2015-10-23 NOTE — Plan of Care (Signed)
Problem: Phase I Progression Outcomes Goal: Anginal pain relieved Outcome: Completed/Met Date Met:  10/23/15 No chest pain at this time.  Goal: Voiding-avoid urinary catheter unless indicated Outcome: Completed/Met Date Met:  10/23/15  Patient's Voiding normally.

## 2016-01-12 ENCOUNTER — Encounter: Payer: Self-pay | Admitting: *Deleted

## 2016-01-13 ENCOUNTER — Encounter
Admission: RE | Disposition: A | Discharge status: Home / Self Care | Payer: Self-pay | Source: Ambulatory Visit | Attending: Unknown Physician Specialty

## 2016-01-13 ENCOUNTER — Encounter: Payer: Self-pay | Admitting: *Deleted

## 2016-01-13 ENCOUNTER — Ambulatory Visit: Payer: Medicare Other | Admitting: Anesthesiology

## 2016-01-13 ENCOUNTER — Ambulatory Visit
Admission: RE | Admit: 2016-01-13 | Discharge: 2016-01-13 | Disposition: A | Discharge status: Home / Self Care | Payer: Medicare Other | Source: Ambulatory Visit | Attending: Unknown Physician Specialty | Admitting: Unknown Physician Specialty

## 2016-01-13 DIAGNOSIS — E669 Obesity, unspecified: Secondary | ICD-10-CM | POA: Diagnosis not present

## 2016-01-13 DIAGNOSIS — F329 Major depressive disorder, single episode, unspecified: Secondary | ICD-10-CM | POA: Diagnosis not present

## 2016-01-13 DIAGNOSIS — Z79899 Other long term (current) drug therapy: Secondary | ICD-10-CM | POA: Diagnosis not present

## 2016-01-13 DIAGNOSIS — K589 Irritable bowel syndrome without diarrhea: Secondary | ICD-10-CM | POA: Insufficient documentation

## 2016-01-13 DIAGNOSIS — K640 First degree hemorrhoids: Secondary | ICD-10-CM | POA: Diagnosis not present

## 2016-01-13 DIAGNOSIS — F419 Anxiety disorder, unspecified: Secondary | ICD-10-CM | POA: Insufficient documentation

## 2016-01-13 DIAGNOSIS — I2510 Atherosclerotic heart disease of native coronary artery without angina pectoris: Secondary | ICD-10-CM | POA: Insufficient documentation

## 2016-01-13 DIAGNOSIS — Z6838 Body mass index (BMI) 38.0-38.9, adult: Secondary | ICD-10-CM | POA: Diagnosis not present

## 2016-01-13 DIAGNOSIS — Z882 Allergy status to sulfonamides status: Secondary | ICD-10-CM | POA: Diagnosis not present

## 2016-01-13 DIAGNOSIS — Z90710 Acquired absence of both cervix and uterus: Secondary | ICD-10-CM | POA: Diagnosis not present

## 2016-01-13 DIAGNOSIS — N3281 Overactive bladder: Secondary | ICD-10-CM | POA: Diagnosis not present

## 2016-01-13 DIAGNOSIS — G4730 Sleep apnea, unspecified: Secondary | ICD-10-CM | POA: Diagnosis not present

## 2016-01-13 DIAGNOSIS — Z7982 Long term (current) use of aspirin: Secondary | ICD-10-CM | POA: Diagnosis not present

## 2016-01-13 DIAGNOSIS — K219 Gastro-esophageal reflux disease without esophagitis: Secondary | ICD-10-CM | POA: Diagnosis not present

## 2016-01-13 DIAGNOSIS — E7800 Pure hypercholesterolemia, unspecified: Secondary | ICD-10-CM | POA: Insufficient documentation

## 2016-01-13 DIAGNOSIS — M810 Age-related osteoporosis without current pathological fracture: Secondary | ICD-10-CM | POA: Insufficient documentation

## 2016-01-13 DIAGNOSIS — Z9049 Acquired absence of other specified parts of digestive tract: Secondary | ICD-10-CM | POA: Diagnosis not present

## 2016-01-13 DIAGNOSIS — Z86010 Personal history of colonic polyps: Secondary | ICD-10-CM | POA: Diagnosis present

## 2016-01-13 DIAGNOSIS — Z880 Allergy status to penicillin: Secondary | ICD-10-CM | POA: Insufficient documentation

## 2016-01-13 DIAGNOSIS — Z885 Allergy status to narcotic agent status: Secondary | ICD-10-CM | POA: Diagnosis not present

## 2016-01-13 DIAGNOSIS — Z87891 Personal history of nicotine dependence: Secondary | ICD-10-CM | POA: Diagnosis not present

## 2016-01-13 DIAGNOSIS — I10 Essential (primary) hypertension: Secondary | ICD-10-CM | POA: Insufficient documentation

## 2016-01-13 DIAGNOSIS — D123 Benign neoplasm of transverse colon: Secondary | ICD-10-CM | POA: Insufficient documentation

## 2016-01-13 DIAGNOSIS — Z1211 Encounter for screening for malignant neoplasm of colon: Secondary | ICD-10-CM | POA: Diagnosis not present

## 2016-01-13 HISTORY — DX: Diffuse cystic mastopathy of unspecified breast: N60.19

## 2016-01-13 HISTORY — DX: Polyp of colon: K63.5

## 2016-01-13 HISTORY — PX: COLONOSCOPY WITH PROPOFOL: SHX5780

## 2016-01-13 HISTORY — DX: Hyperlipidemia, unspecified: E78.5

## 2016-01-13 HISTORY — DX: Obesity, unspecified: E66.9

## 2016-01-13 SURGERY — COLONOSCOPY WITH PROPOFOL
Anesthesia: General

## 2016-01-13 MED ORDER — FENTANYL CITRATE (PF) 100 MCG/2ML IJ SOLN
INTRAMUSCULAR | Status: DC | PRN
  Administered 2016-01-13: 50 ug via INTRAVENOUS

## 2016-01-13 MED ORDER — SODIUM CHLORIDE 0.9 % IV SOLN: INTRAVENOUS | Status: DC

## 2016-01-13 MED ORDER — MIDAZOLAM HCL 2 MG/2ML IJ SOLN
INTRAMUSCULAR | Status: DC | PRN
  Administered 2016-01-13: 1 mg via INTRAVENOUS

## 2016-01-13 MED ORDER — SODIUM CHLORIDE 0.9 % IV SOLN
INTRAVENOUS | Status: DC
  Administered 2016-01-13: 12:00:00 via INTRAVENOUS

## 2016-01-13 MED ORDER — PROPOFOL 500 MG/50ML IV EMUL
INTRAVENOUS | Status: DC | PRN
  Administered 2016-01-13: 120 ug/kg/min via INTRAVENOUS

## 2016-01-13 NOTE — Op Note (Signed)
[facility] Gastroenterology Patient Name: Marcia Livingston Procedure Date: 01/13/2016 12:34 PM MRN: [id number] Account #: [id number] Date of Birth: August 19, 1946 Admit Type: Outpatient Age: 70 Room: [facility] ENDO ROOM 4 Gender: Female Note Status: Finalized Procedure:            Colonoscopy Indications:          High risk colon cancer surveillance: Personal history                        of colonic polyps Providers:            Manya Silvas, MD Referring MD:         Ardyth Man. Santayana (Referring MD) Medicines:            Propofol per Anesthesia Complications:        No immediate complications. Procedure:            Pre-Anesthesia Assessment:                       - After reviewing the risks and benefits, the patient                        was deemed in satisfactory condition to undergo the                        procedure.                       After obtaining informed consent, the colonoscope was                        passed under direct vision. Throughout the procedure,                        the patient's blood pressure, pulse, and oxygen                        saturations were monitored continuously. The                        Colonoscope was introduced through the anus and                        advanced to the the cecum, identified by appendiceal                        orifice and ileocecal valve. The colonoscopy was                        somewhat difficult due to a tortuous colon. The patient                        tolerated the procedure well. The quality of the bowel                        preparation was good. Findings:      Two sessile polyps were found in the proximal transverse colon. The       polyps were diminutive in size. These polyps were removed with a jumbo       cold forceps. Resection and retrieval were complete.  Internal hemorrhoids were found during endoscopy. The hemorrhoids were       small and Grade I (internal hemorrhoids that do not  prolapse).      The exam was otherwise without abnormality. Impression:           - Two diminutive polyps in the proximal transverse                        colon, removed with a jumbo cold forceps. Resected and                        retrieved.                       - Internal hemorrhoids.                       - The examination was otherwise normal. Recommendation:       - Await pathology results. Probably repeat in 5 years. Manya Silvas, MD 01/13/2016 1:03:15 PM This report has been signed electronically. Number of Addenda: 0 Note Initiated On: 01/13/2016 12:34 PM Scope Withdrawal Time: 0 hours 8 minutes 44 seconds  Total Procedure Duration: 0 hours 20 minutes 53 seconds       [facility]

## 2016-01-13 NOTE — Transfer of Care (Signed)
Immediate Anesthesia Transfer of Care Note  Patient: Marcia Livingston  Procedure(s) Performed: Procedure(s): COLONOSCOPY WITH PROPOFOL (N/A)  Patient Location: PACU  Anesthesia Type:General  Level of Consciousness: awake, alert , oriented and sedated  Airway & Oxygen Therapy: Patient Spontanous Breathing and Patient connected to face mask oxygen  Post-op Assessment: Report given to RN and Post -op Vital signs reviewed and stable  Post vital signs: Reviewed and stable  Last Vitals:  Filed Vitals:   01/13/16 1140 01/13/16 1143  BP: 140/65 149/77  Pulse: 77 78  Temp: 36.1 C 36.6 C  Resp:  16    Complications: No apparent anesthesia complications

## 2016-01-13 NOTE — H&P (Signed)
Primary Care Physician:  Gearldine Shown, DO Primary Gastroenterologist:  Dr. Vira Agar  Pre-Procedure History & Physical: HPI:  Marcia Livingston is a 70 y.o. female is here for an colonoscopy.   Past Medical History  Diagnosis Date  . Sleep apnea   . Hypertension   . High cholesterol   . Osteoporosis   . Depression   . Anxiety   . GERD (gastroesophageal reflux disease)   . IBS (irritable bowel syndrome)   . Overactive bladder   . CAD (coronary artery disease)   . Sleep apnea   . Colon polyps   . Hyperlipidemia   . IBS (irritable bowel syndrome)   . Fibrocystic breast disease   . Depression   . Obesity     Past Surgical History  Procedure Laterality Date  . Abdominal hysterectomy    . Cholecystectomy    . Foot surgery Left   . Appendectomy      Prior to Admission medications   Medication Sig Start Date End Date Taking? Authorizing Provider  ALPRAZolam Duanne Moron) 0.25 MG tablet Take 0.25 mg by mouth at bedtime as needed for anxiety.   Yes Historical Provider, MD  aspirin EC 81 MG tablet Take 81 mg by mouth daily.   Yes Historical Provider, MD  buPROPion (WELLBUTRIN XL) 150 MG 24 hr tablet Take 150 mg by mouth daily.   Yes Historical Provider, MD  Cholecalciferol 10000 units CAPS Take 1 capsule by mouth daily.   Yes Historical Provider, MD  colestipol (COLESTID) 1 g tablet Take 2 g by mouth 2 (two) times daily.   Yes Historical Provider, MD  hydrochlorothiazide (HYDRODIURIL) 25 MG tablet Take 25 mg by mouth daily.   Yes Historical Provider, MD  ranitidine (ZANTAC) 150 MG tablet Take 150 mg by mouth 2 (two) times daily.   Yes Historical Provider, MD  rosuvastatin (CRESTOR) 5 MG tablet Take 5 mg by mouth 3 (three) times a week.   Yes Historical Provider, MD  escitalopram (LEXAPRO) 20 MG tablet Take 20 mg by mouth daily. Reported on 01/13/2016    Historical Provider, MD    Allergies as of 12/30/2015 - Review Complete 10/22/2015  Allergen Reaction Noted  .  Penicillins Rash 10/22/2015  . Codeine Hives 10/22/2015  . Sulfa antibiotics Hives 10/22/2015    Family History  Problem Relation Age of Onset  . Colon cancer Mother   . Stroke Mother   . Hypertension Mother   . Heart attack Mother   . Breast cancer Mother   . Emphysema Father   . Prostate cancer Father     Social History   Social History  . Marital Status: Married    Spouse Name: N/A  . Number of Children: N/A  . Years of Education: N/A   Occupational History  . Not on file.   Social History Main Topics  . Smoking status: Former Research scientist (life sciences)  . Smokeless tobacco: Never Used  . Alcohol Use: No  . Drug Use: No  . Sexual Activity: Not on file   Other Topics Concern  . Not on file   Social History Narrative    Review of Systems: See HPI, otherwise negative ROS  Physical Exam: BP 149/77 mmHg  Pulse 78  Temp(Src) 97.8 F (36.6 C) (Tympanic)  Resp 16  Ht 5\' 2"  (1.575 m)  Wt 94.348 kg (208 lb)  BMI 38.03 kg/m2  SpO2 99% General:   Alert,  pleasant and cooperative in NAD Head:  Normocephalic and atraumatic. Neck:  Supple; no masses or thyromegaly. Lungs:  Clear throughout to auscultation.    Heart:  Regular rate and rhythm. Abdomen:  Soft, nontender and nondistended. Normal bowel sounds, without guarding, and without rebound.   Neurologic:  Alert and  oriented x4;  grossly normal neurologically.  Impression/Plan: Marcia Livingston is here for an colonoscopy to be performed for [facility] colon polyps  Risks, benefits, limitations, and alternatives regarding  colonoscopy have been reviewed with the patient.  Questions have been answered.  All parties agreeable.   Gaylyn Cheers, MD  01/13/2016, 12:31 PM

## 2016-01-13 NOTE — Anesthesia Procedure Notes (Signed)
Performed by: COOK-MARTIN, Raguel Kosloski Pre-anesthesia Checklist: Patient identified, Emergency Drugs available, Suction available, Patient being monitored and Timeout performed Patient Re-evaluated:Patient Re-evaluated prior to inductionOxygen Delivery Method: Non-rebreather mask Preoxygenation: Pre-oxygenation with 100% oxygen Intubation Type: IV induction Ventilation: Oral airway inserted - appropriate to patient size Placement Confirmation: positive ETCO2 and CO2 detector     

## 2016-01-13 NOTE — Anesthesia Preprocedure Evaluation (Signed)
Anesthesia Evaluation  Patient identified by MRN, date of birth, ID band  Reviewed: Allergy & Precautions, NPO status , Patient's Chart, lab work & pertinent test results  History of Anesthesia Complications Negative for: history of anesthetic complications  Airway Mallampati: III       Dental   Pulmonary sleep apnea , former smoker,           Cardiovascular hypertension, Pt. on medications + angina + CAD       Neuro/Psych Anxiety Depression negative neurological ROS     GI/Hepatic Neg liver ROS, GERD  Medicated and Controlled,  Endo/Other  negative endocrine ROSMorbid obesity  Renal/GU negative Renal ROS     Musculoskeletal   Abdominal   Peds  Hematology negative hematology ROS (+)   Anesthesia Other Findings   Reproductive/Obstetrics                             Anesthesia Physical Anesthesia Plan  ASA: III  Anesthesia Plan: General   Post-op Pain Management:    Induction: Intravenous  Airway Management Planned: Nasal Cannula  Additional Equipment:   Intra-op Plan:   Post-operative Plan:   Informed Consent: I have reviewed the patients History and Physical, chart, labs and discussed the procedure including the risks, benefits and alternatives for the proposed anesthesia with the patient or authorized representative who has indicated his/her understanding and acceptance.     Plan Discussed with:   Anesthesia Plan Comments:         Anesthesia Quick Evaluation

## 2016-01-13 NOTE — Anesthesia Postprocedure Evaluation (Signed)
Anesthesia Post Note  Patient: Marcia Livingston  Procedure(s) Performed: Procedure(s) (LRB): COLONOSCOPY WITH PROPOFOL (N/A)  Patient location during evaluation: Endoscopy Anesthesia Type: General Level of consciousness: awake and alert Pain management: pain level controlled Vital Signs Assessment: post-procedure vital signs reviewed and stable Respiratory status: spontaneous breathing and respiratory function stable Cardiovascular status: stable Anesthetic complications: no    Last Vitals:  Filed Vitals:   01/13/16 1143 01/13/16 1306  BP: 149/77 107/75  Pulse: 78 74  Temp: 36.6 C 36.3 C  Resp: 16 15    Last Pain: There were no vitals filed for this visit.               Jameer Storie K

## 2016-01-14 ENCOUNTER — Encounter: Payer: Self-pay | Admitting: Unknown Physician Specialty

## 2016-01-14 LAB — SURGICAL PATHOLOGY

## 2016-01-26 ENCOUNTER — Encounter: Payer: Self-pay | Admitting: *Deleted

## 2016-02-01 ENCOUNTER — Ambulatory Visit
Admission: RE | Admit: 2016-02-01 | Discharge: 2016-02-01 | Disposition: A | Discharge status: Home / Self Care | Payer: Medicare Other | Source: Ambulatory Visit | Attending: Ophthalmology | Admitting: Ophthalmology

## 2016-02-01 ENCOUNTER — Ambulatory Visit: Payer: Medicare Other | Admitting: Anesthesiology

## 2016-02-01 ENCOUNTER — Encounter: Payer: Self-pay | Admitting: *Deleted

## 2016-02-01 ENCOUNTER — Encounter
Admission: RE | Disposition: A | Discharge status: Home / Self Care | Payer: Self-pay | Source: Ambulatory Visit | Attending: Ophthalmology

## 2016-02-01 DIAGNOSIS — G4730 Sleep apnea, unspecified: Secondary | ICD-10-CM | POA: Insufficient documentation

## 2016-02-01 DIAGNOSIS — E669 Obesity, unspecified: Secondary | ICD-10-CM | POA: Insufficient documentation

## 2016-02-01 DIAGNOSIS — F329 Major depressive disorder, single episode, unspecified: Secondary | ICD-10-CM | POA: Insufficient documentation

## 2016-02-01 DIAGNOSIS — K589 Irritable bowel syndrome without diarrhea: Secondary | ICD-10-CM | POA: Diagnosis not present

## 2016-02-01 DIAGNOSIS — F419 Anxiety disorder, unspecified: Secondary | ICD-10-CM | POA: Diagnosis not present

## 2016-02-01 DIAGNOSIS — Z880 Allergy status to penicillin: Secondary | ICD-10-CM | POA: Diagnosis not present

## 2016-02-01 DIAGNOSIS — M1990 Unspecified osteoarthritis, unspecified site: Secondary | ICD-10-CM | POA: Insufficient documentation

## 2016-02-01 DIAGNOSIS — Z885 Allergy status to narcotic agent status: Secondary | ICD-10-CM | POA: Insufficient documentation

## 2016-02-01 DIAGNOSIS — Z90710 Acquired absence of both cervix and uterus: Secondary | ICD-10-CM | POA: Diagnosis not present

## 2016-02-01 DIAGNOSIS — E7800 Pure hypercholesterolemia, unspecified: Secondary | ICD-10-CM | POA: Diagnosis not present

## 2016-02-01 DIAGNOSIS — H2511 Age-related nuclear cataract, right eye: Secondary | ICD-10-CM | POA: Insufficient documentation

## 2016-02-01 DIAGNOSIS — Z86010 Personal history of colonic polyps: Secondary | ICD-10-CM | POA: Diagnosis not present

## 2016-02-01 DIAGNOSIS — Z87891 Personal history of nicotine dependence: Secondary | ICD-10-CM | POA: Diagnosis not present

## 2016-02-01 DIAGNOSIS — Z6838 Body mass index (BMI) 38.0-38.9, adult: Secondary | ICD-10-CM | POA: Insufficient documentation

## 2016-02-01 DIAGNOSIS — Z9049 Acquired absence of other specified parts of digestive tract: Secondary | ICD-10-CM | POA: Insufficient documentation

## 2016-02-01 DIAGNOSIS — Z85828 Personal history of other malignant neoplasm of skin: Secondary | ICD-10-CM | POA: Diagnosis not present

## 2016-02-01 DIAGNOSIS — E785 Hyperlipidemia, unspecified: Secondary | ICD-10-CM | POA: Diagnosis not present

## 2016-02-01 DIAGNOSIS — N6019 Diffuse cystic mastopathy of unspecified breast: Secondary | ICD-10-CM | POA: Diagnosis not present

## 2016-02-01 DIAGNOSIS — M810 Age-related osteoporosis without current pathological fracture: Secondary | ICD-10-CM | POA: Diagnosis not present

## 2016-02-01 DIAGNOSIS — I10 Essential (primary) hypertension: Secondary | ICD-10-CM | POA: Diagnosis not present

## 2016-02-01 DIAGNOSIS — R011 Cardiac murmur, unspecified: Secondary | ICD-10-CM | POA: Insufficient documentation

## 2016-02-01 DIAGNOSIS — I25119 Atherosclerotic heart disease of native coronary artery with unspecified angina pectoris: Secondary | ICD-10-CM | POA: Insufficient documentation

## 2016-02-01 HISTORY — DX: Cardiac murmur, unspecified: R01.1

## 2016-02-01 HISTORY — PX: CATARACT EXTRACTION W/PHACO: SHX586

## 2016-02-01 HISTORY — DX: Unspecified osteoarthritis, unspecified site: M19.90

## 2016-02-01 SURGERY — PHACOEMULSIFICATION, CATARACT, WITH IOL INSERTION
Anesthesia: Monitor Anesthesia Care | Site: Eye | Laterality: Right | Wound class: Clean

## 2016-02-01 MED ORDER — MOXIFLOXACIN HCL 0.5 % OP SOLN: 1.0000 [drp] | Freq: Once | OPHTHALMIC | Status: DC

## 2016-02-01 MED ORDER — EPINEPHRINE HCL 1 MG/ML IJ SOLN
INTRAOCULAR | Status: DC | PRN
  Administered 2016-02-01: 1 mL via OPHTHALMIC

## 2016-02-01 MED ORDER — LIDOCAINE HCL (PF) 1 % IJ SOLN
INTRAMUSCULAR | Status: AC
  Filled 2016-02-01: qty 2

## 2016-02-01 MED ORDER — NA CHONDROIT SULF-NA HYALURON 40-17 MG/ML IO SOLN
INTRAOCULAR | Status: DC | PRN
  Administered 2016-02-01: 1 mL via INTRAOCULAR

## 2016-02-01 MED ORDER — FENTANYL CITRATE (PF) 100 MCG/2ML IJ SOLN
INTRAMUSCULAR | Status: DC | PRN
  Administered 2016-02-01: 50 ug via INTRAVENOUS

## 2016-02-01 MED ORDER — ARMC OPHTHALMIC DILATING GEL
OPHTHALMIC | Status: AC
  Administered 2016-02-01: 1 via OPHTHALMIC
  Filled 2016-02-01: qty 0.25

## 2016-02-01 MED ORDER — MIDAZOLAM HCL 2 MG/2ML IJ SOLN
INTRAMUSCULAR | Status: DC | PRN
  Administered 2016-02-01: 1 mg via INTRAVENOUS

## 2016-02-01 MED ORDER — TETRACAINE HCL 0.5 % OP SOLN
1.0000 [drp] | Freq: Once | OPHTHALMIC | Status: AC
  Administered 2016-02-01: 1 [drp] via OPHTHALMIC

## 2016-02-01 MED ORDER — SODIUM CHLORIDE 0.9 % IV SOLN
INTRAVENOUS | Status: DC
  Administered 2016-02-01: 09:00:00 via INTRAVENOUS

## 2016-02-01 MED ORDER — ARMC OPHTHALMIC DILATING GEL
1.0000 "application " | OPHTHALMIC | Status: AC | PRN
  Administered 2016-02-01 (×2): 1 via OPHTHALMIC

## 2016-02-01 MED ORDER — MOXIFLOXACIN HCL 0.5 % OP SOLN
OPHTHALMIC | Status: DC | PRN
  Administered 2016-02-01: 1 [drp] via OPHTHALMIC

## 2016-02-01 MED ORDER — CEFUROXIME OPHTHALMIC INJECTION 1 MG/0.1 ML
INJECTION | OPHTHALMIC | Status: AC
  Filled 2016-02-01: qty 0.1

## 2016-02-01 MED ORDER — POVIDONE-IODINE 5 % OP SOLN
1.0000 "application " | Freq: Once | OPHTHALMIC | Status: AC
  Administered 2016-02-01: 1 via OPHTHALMIC

## 2016-02-01 MED ORDER — EPINEPHRINE HCL 1 MG/ML IJ SOLN
INTRAMUSCULAR | Status: AC
  Filled 2016-02-01: qty 1

## 2016-02-01 MED ORDER — TETRACAINE HCL 0.5 % OP SOLN
OPHTHALMIC | Status: AC
  Administered 2016-02-01: 1 [drp] via OPHTHALMIC
  Filled 2016-02-01: qty 2

## 2016-02-01 MED ORDER — NA CHONDROIT SULF-NA HYALURON 40-17 MG/ML IO SOLN
INTRAOCULAR | Status: AC
  Filled 2016-02-01: qty 1

## 2016-02-01 MED ORDER — MOXIFLOXACIN HCL 0.5 % OP SOLN
OPHTHALMIC | Status: AC
  Filled 2016-02-01: qty 3

## 2016-02-01 MED ORDER — CARBACHOL 0.01 % IO SOLN
INTRAOCULAR | Status: DC | PRN
Start: 2016-02-01 — End: 2016-02-01
  Administered 2016-02-01: .5 mL via INTRAOCULAR

## 2016-02-01 MED ORDER — POVIDONE-IODINE 5 % OP SOLN
OPHTHALMIC | Status: AC
  Administered 2016-02-01: 1 via OPHTHALMIC
  Filled 2016-02-01: qty 30

## 2016-02-01 SURGICAL SUPPLY — 22 items
CANNULA ANT/CHMB 27GA (MISCELLANEOUS) ×2 IMPLANT
CUP MEDICINE 2OZ PLAST GRAD ST (MISCELLANEOUS) ×2 IMPLANT
GLOVE BIO SURGEON STRL SZ8 (GLOVE) ×2 IMPLANT
GLOVE BIOGEL M 6.5 STRL (GLOVE) ×2 IMPLANT
GLOVE SURG LX 8.0 MICRO (GLOVE) ×1
GLOVE SURG LX STRL 8.0 MICRO (GLOVE) ×1 IMPLANT
GOWN STRL REUS W/ TWL LRG LVL3 (GOWN DISPOSABLE) ×2 IMPLANT
GOWN STRL REUS W/TWL LRG LVL3 (GOWN DISPOSABLE) ×2
LENS IOL TECNIS 21.5 (Intraocular Lens) ×2 IMPLANT
LENS IOL TECNIS MONO 1P 21.5 (Intraocular Lens) ×1 IMPLANT
PACK CATARACT (MISCELLANEOUS) ×2 IMPLANT
PACK CATARACT BRASINGTON LX (MISCELLANEOUS) ×2 IMPLANT
PACK EYE AFTER SURG (MISCELLANEOUS) ×2 IMPLANT
SOL BSS BAG (MISCELLANEOUS) ×2
SOL PREP PVP 2OZ (MISCELLANEOUS)
SOLUTION BSS BAG (MISCELLANEOUS) ×1 IMPLANT
SOLUTION PREP PVP 2OZ (MISCELLANEOUS) IMPLANT
SYR 3ML LL SCALE MARK (SYRINGE) ×2 IMPLANT
SYR 5ML LL (SYRINGE) ×2 IMPLANT
SYR TB 1ML 27GX1/2 LL (SYRINGE) ×2 IMPLANT
WATER STERILE IRR 1000ML POUR (IV SOLUTION) ×2 IMPLANT
WIPE NON LINTING 3.25X3.25 (MISCELLANEOUS) ×2 IMPLANT

## 2016-02-01 NOTE — Anesthesia Postprocedure Evaluation (Signed)
Anesthesia Post Note  Patient: Marcia Livingston  Procedure(s) Performed: Procedure(s) (LRB): CATARACT EXTRACTION PHACO AND INTRAOCULAR LENS PLACEMENT (IOC) (Right)  Patient location during evaluation: PACU Anesthesia Type: MAC Level of consciousness: awake, oriented and awake and alert Pain management: pain level controlled Vital Signs Assessment: post-procedure vital signs reviewed and stable Respiratory status: spontaneous breathing, nonlabored ventilation and respiratory function stable Cardiovascular status: stable Anesthetic complications: no    Last Vitals:  Filed Vitals:   02/01/16 0834 02/01/16 1016  BP: 141/53 119/107  Pulse: 67 70  Temp: 37.1 C   Resp: 18     Last Pain: There were no vitals filed for this visit.               [employer]

## 2016-02-01 NOTE — H&P (Signed)
  All labs reviewed. Abnormal studies sent to patients PCP when indicated.  Previous H&P reviewed, patient examined, there are NO CHANGES.  Marcia Livingston LOUIS4/18/20179:39 AM

## 2016-02-01 NOTE — Anesthesia Preprocedure Evaluation (Signed)
Anesthesia Evaluation  Patient identified by MRN, date of birth, ID band  Reviewed: Allergy & Precautions, NPO status , Patient's Chart, lab work & pertinent test results  History of Anesthesia Complications Negative for: history of anesthetic complications  Airway Mallampati: III       Dental  (+) Caps   Pulmonary sleep apnea , former smoker,           Cardiovascular hypertension, Pt. on medications + angina + CAD  + Valvular Problems/Murmurs      Neuro/Psych Anxiety Depression negative neurological ROS     GI/Hepatic Neg liver ROS, GERD  Medicated and Controlled,  Endo/Other  negative endocrine ROSMorbid obesity  Renal/GU negative Renal ROS     Musculoskeletal   Abdominal   Peds  Hematology negative hematology ROS (+)   Anesthesia Other Findings Past Medical History:   Sleep apnea                                                  Hypertension                                                 High cholesterol                                             Osteoporosis                                                 Depression                                                   Anxiety                                                      GERD (gastroesophageal reflux disease)                       IBS (irritable bowel syndrome)                               Overactive bladder                                           CAD (coronary artery disease)                                Sleep apnea  Colon polyps                                                 Hyperlipidemia                                               IBS (irritable bowel syndrome)                               Fibrocystic breast disease                                   Depression                                                   Obesity                                                      Heart murmur                                                  Arthritis                                                    Reproductive/Obstetrics                             Anesthesia Physical  Anesthesia Plan  ASA: III  Anesthesia Plan: MAC   Post-op Pain Management:    Induction: Intravenous  Airway Management Planned: Nasal Cannula  Additional Equipment:   Intra-op Plan:   Post-operative Plan:   Informed Consent: I have reviewed the patients History and Physical, chart, labs and discussed the procedure including the risks, benefits and alternatives for the proposed anesthesia with the patient or authorized representative who has indicated his/her understanding and acceptance.     Plan Discussed with:   Anesthesia Plan Comments:         Anesthesia Quick Evaluation

## 2016-02-01 NOTE — Op Note (Signed)
PREOPERATIVE DIAGNOSIS:  Nuclear sclerotic cataract of the right eye.   POSTOPERATIVE DIAGNOSIS:  nuclear sclerotic cataract right eye   OPERATIVE PROCEDURE: Procedure(s): CATARACT EXTRACTION PHACO AND INTRAOCULAR LENS PLACEMENT (IOC)   SURGEON:  Birder Robson, MD.   ANESTHESIA:  Anesthesiologist: Martha Clan, MD CRNA: Lance Muss, CRNA  1.      Managed anesthesia care. 2.      Topical tetracaine drops followed by 2% Xylocaine jelly applied in the preoperative holding area.   COMPLICATIONS:  None.   TECHNIQUE:   Stop and chop   DESCRIPTION OF PROCEDURE:  The patient was examined and consented in the preoperative holding area where the aforementioned topical anesthesia was applied to the right eye and then brought back to the Operating Room where the right eye was prepped and draped in the usual sterile ophthalmic fashion and a lid speculum was placed. A paracentesis was created with the side port blade and the anterior chamber was filled with viscoelastic. A near clear corneal incision was performed with the steel keratome. A continuous curvilinear capsulorrhexis was performed with a cystotome followed by the capsulorrhexis forceps. Hydrodissection and hydrodelineation were carried out with BSS on a blunt cannula. The lens was removed in a stop and chop  technique and the remaining cortical material was removed with the irrigation-aspiration handpiece. The capsular bag was inflated with viscoelastic and the Technis ZCB00  lens was placed in the capsular bag without complication. The remaining viscoelastic was removed from the eye with the irrigation-aspiration handpiece. The wounds were hydrated. The anterior chamber was flushed with Miostat and the eye was inflated to physiologic pressure. 0.2 mL of Vigamox diluted three/one with BSS was placed in the anterior chamber. The wounds were found to be water tight. The eye was dressed with Vigamox. The patient was given protective glasses to wear  throughout the day and a shield with which to sleep tonight. The patient was also given drops with which to begin a drop regimen today and will follow-up with me in one day.  Implant Name Type Inv. Item Serial No. Manufacturer Lot No. LRB No. Used  LENS IOL TECNIS 21.5 - [id number] Intraocular Lens LENS IOL TECNIS 21.5 [id number] Right 1   Procedure(s) with comments: CATARACT EXTRACTION PHACO AND INTRAOCULAR LENS PLACEMENT (IOC) (Right) - [place]    00:37.2 AP%  20.7% CDE  7.69 fluid pack lot # [id number]  Electronically signed: Uncertain 02/01/2016 10:14 AM

## 2016-02-01 NOTE — Discharge Instructions (Signed)
AMBULATORY SURGERY  DISCHARGE INSTRUCTIONS   The drugs that you were given will stay in your system until tomorrow so for the next 24 hours you should not:  Drive an automobile Make any legal decisions Drink any alcoholic beverage   You may resume regular meals tomorrow.  Today it is better to start with liquids and gradually work up to solid foods.  You may eat anything you prefer, but it is better to start with liquids, then soup and crackers, and gradually work up to solid foods.   Please notify your doctor immediately if you have any unusual bleeding, trouble breathing, redness and pain at the surgery site, drainage, fever, or pain not relieved by medication.   Your post-operative visit with Dr.                                     is: Date:                        Time:    Please call to schedule your post-operative visit.  Additional Instructions:  Eye Surgery Discharge Instructions  Expect mild scratchy sensation or mild soreness. DO NOT RUB YOUR EYE!  The day of surgery: Minimal physical activity, but bed rest is not required No reading, computer work, or close hand work No bending, lifting, or straining. May watch TV  For 24 hours: No driving, legal decisions, or alcoholic beverages Safety precautions Eat anything you prefer: It is better to start with liquids, then soup then solid foods. _____ Eye patch should be worn until postoperative exam tomorrow. ____ Solar shield eyeglasses should be worn for comfort in the sunlight/patch while sleeping  Resume all regular medications including aspirin or Coumadin if these were discontinued prior to surgery. You may shower, bathe, shave, or wash your hair. Tylenol may be taken for mild discomfort.  Call your doctor if you experience significant pain, nausea, or vomiting, fever > 101 or other signs of infection. [phone] or [phone] Specific instructions:  Follow-up Information    Follow up with  PORFILIO,WILLIAM LOUIS, MD In 1 day.   Specialty:  Ophthalmology   Why:  April 19 at 9:20am   Contact information:   [address] [place] [address] [phone]

## 2016-02-01 NOTE — Transfer of Care (Signed)
Immediate Anesthesia Transfer of Care Note  Patient: Marcia Livingston  Procedure(s) Performed: Procedure(s) with comments: CATARACT EXTRACTION PHACO AND INTRAOCULAR LENS PLACEMENT (IOC) (Right) - [place]    00:37.2 AP%  20.7% CDE  7.69 fluid pack lot # [id number]  Patient Location: PACU  Anesthesia Type:MAC  Level of Consciousness: awake, alert  and oriented  Airway & Oxygen Therapy: Patient Spontanous Breathing  Post-op Assessment: Report given to RN and Post -op Vital signs reviewed and stable  Post vital signs: Reviewed and stable  Last Vitals:  Filed Vitals:   02/01/16 0834 02/01/16 1016  BP: 141/53 119/107  Pulse: 67 70  Temp: 37.1 C   Resp: 18     Complications: No apparent anesthesia complications

## 2016-02-16 ENCOUNTER — Encounter: Payer: Self-pay | Admitting: *Deleted

## 2016-02-22 ENCOUNTER — Encounter
Admission: RE | Disposition: A | Discharge status: Home / Self Care | Payer: Self-pay | Source: Ambulatory Visit | Attending: Ophthalmology

## 2016-02-22 ENCOUNTER — Encounter: Payer: Self-pay | Admitting: *Deleted

## 2016-02-22 ENCOUNTER — Ambulatory Visit: Payer: Medicare Other | Admitting: Anesthesiology

## 2016-02-22 ENCOUNTER — Ambulatory Visit
Admission: RE | Admit: 2016-02-22 | Discharge: 2016-02-22 | Disposition: A | Discharge status: Home / Self Care | Payer: Medicare Other | Source: Ambulatory Visit | Attending: Ophthalmology | Admitting: Ophthalmology

## 2016-02-22 DIAGNOSIS — Z6838 Body mass index (BMI) 38.0-38.9, adult: Secondary | ICD-10-CM | POA: Insufficient documentation

## 2016-02-22 DIAGNOSIS — G4730 Sleep apnea, unspecified: Secondary | ICD-10-CM | POA: Diagnosis not present

## 2016-02-22 DIAGNOSIS — E785 Hyperlipidemia, unspecified: Secondary | ICD-10-CM | POA: Insufficient documentation

## 2016-02-22 DIAGNOSIS — Z885 Allergy status to narcotic agent status: Secondary | ICD-10-CM | POA: Diagnosis not present

## 2016-02-22 DIAGNOSIS — Z87891 Personal history of nicotine dependence: Secondary | ICD-10-CM | POA: Insufficient documentation

## 2016-02-22 DIAGNOSIS — K219 Gastro-esophageal reflux disease without esophagitis: Secondary | ICD-10-CM | POA: Insufficient documentation

## 2016-02-22 DIAGNOSIS — M1990 Unspecified osteoarthritis, unspecified site: Secondary | ICD-10-CM | POA: Diagnosis not present

## 2016-02-22 DIAGNOSIS — H2512 Age-related nuclear cataract, left eye: Secondary | ICD-10-CM | POA: Diagnosis not present

## 2016-02-22 DIAGNOSIS — E7800 Pure hypercholesterolemia, unspecified: Secondary | ICD-10-CM | POA: Diagnosis not present

## 2016-02-22 DIAGNOSIS — F329 Major depressive disorder, single episode, unspecified: Secondary | ICD-10-CM | POA: Insufficient documentation

## 2016-02-22 DIAGNOSIS — F419 Anxiety disorder, unspecified: Secondary | ICD-10-CM | POA: Diagnosis not present

## 2016-02-22 DIAGNOSIS — K589 Irritable bowel syndrome without diarrhea: Secondary | ICD-10-CM | POA: Diagnosis not present

## 2016-02-22 DIAGNOSIS — Z85828 Personal history of other malignant neoplasm of skin: Secondary | ICD-10-CM | POA: Insufficient documentation

## 2016-02-22 DIAGNOSIS — Z90710 Acquired absence of both cervix and uterus: Secondary | ICD-10-CM | POA: Insufficient documentation

## 2016-02-22 DIAGNOSIS — M810 Age-related osteoporosis without current pathological fracture: Secondary | ICD-10-CM | POA: Diagnosis not present

## 2016-02-22 DIAGNOSIS — I10 Essential (primary) hypertension: Secondary | ICD-10-CM | POA: Diagnosis not present

## 2016-02-22 DIAGNOSIS — I2510 Atherosclerotic heart disease of native coronary artery without angina pectoris: Secondary | ICD-10-CM | POA: Insufficient documentation

## 2016-02-22 DIAGNOSIS — Z880 Allergy status to penicillin: Secondary | ICD-10-CM | POA: Diagnosis not present

## 2016-02-22 DIAGNOSIS — Z9049 Acquired absence of other specified parts of digestive tract: Secondary | ICD-10-CM | POA: Insufficient documentation

## 2016-02-22 DIAGNOSIS — R011 Cardiac murmur, unspecified: Secondary | ICD-10-CM | POA: Diagnosis not present

## 2016-02-22 HISTORY — PX: CATARACT EXTRACTION W/PHACO: SHX586

## 2016-02-22 HISTORY — DX: Malignant (primary) neoplasm, unspecified: C80.1

## 2016-02-22 SURGERY — PHACOEMULSIFICATION, CATARACT, WITH IOL INSERTION
Anesthesia: Monitor Anesthesia Care | Site: Eye | Laterality: Left | Wound class: Clean

## 2016-02-22 MED ORDER — MOXIFLOXACIN HCL 0.5 % OP SOLN: 1.0000 [drp] | OPHTHALMIC | Status: DC | PRN

## 2016-02-22 MED ORDER — NA CHONDROIT SULF-NA HYALURON 40-17 MG/ML IO SOLN
INTRAOCULAR | Status: AC
  Filled 2016-02-22: qty 1

## 2016-02-22 MED ORDER — EPINEPHRINE HCL 1 MG/ML IJ SOLN
INTRAOCULAR | Status: DC | PRN
  Administered 2016-02-22: 1 mL via OPHTHALMIC

## 2016-02-22 MED ORDER — EPINEPHRINE HCL 1 MG/ML IJ SOLN
INTRAMUSCULAR | Status: AC
  Filled 2016-02-22: qty 1

## 2016-02-22 MED ORDER — TETRACAINE HCL 0.5 % OP SOLN
OPHTHALMIC | Status: AC
  Administered 2016-02-22: 1 [drp] via OPHTHALMIC
  Filled 2016-02-22: qty 2

## 2016-02-22 MED ORDER — CEFUROXIME OPHTHALMIC INJECTION 1 MG/0.1 ML
INJECTION | OPHTHALMIC | Status: AC
  Filled 2016-02-22: qty 0.1

## 2016-02-22 MED ORDER — CARBACHOL 0.01 % IO SOLN
INTRAOCULAR | Status: DC | PRN
  Administered 2016-02-22: .5 mL via INTRAOCULAR

## 2016-02-22 MED ORDER — FENTANYL CITRATE (PF) 100 MCG/2ML IJ SOLN
INTRAMUSCULAR | Status: DC | PRN
  Administered 2016-02-22: 50 ug via INTRAVENOUS

## 2016-02-22 MED ORDER — SODIUM CHLORIDE 0.9 % IV SOLN
INTRAVENOUS | Status: DC
  Administered 2016-02-22: 11:00:00 via INTRAVENOUS

## 2016-02-22 MED ORDER — POVIDONE-IODINE 5 % OP SOLN
1.0000 "application " | Freq: Once | OPHTHALMIC | Status: AC
  Administered 2016-02-22: 1 via OPHTHALMIC

## 2016-02-22 MED ORDER — MOXIFLOXACIN HCL 0.5 % OP SOLN
OPHTHALMIC | Status: AC
  Filled 2016-02-22: qty 3

## 2016-02-22 MED ORDER — POVIDONE-IODINE 5 % OP SOLN
OPHTHALMIC | Status: AC
  Administered 2016-02-22: 1 via OPHTHALMIC
  Filled 2016-02-22: qty 30

## 2016-02-22 MED ORDER — MOXIFLOXACIN HCL 0.5 % OP SOLN
OPHTHALMIC | Status: DC | PRN
  Administered 2016-02-22: 1 [drp] via OPHTHALMIC

## 2016-02-22 MED ORDER — ARMC OPHTHALMIC DILATING GEL
OPHTHALMIC | Status: AC
  Administered 2016-02-22: 1 via OPHTHALMIC
  Filled 2016-02-22: qty 0.25

## 2016-02-22 MED ORDER — ARMC OPHTHALMIC DILATING GEL
1.0000 "application " | OPHTHALMIC | Status: AC | PRN
  Administered 2016-02-22 (×2): 1 via OPHTHALMIC

## 2016-02-22 MED ORDER — TETRACAINE HCL 0.5 % OP SOLN
1.0000 [drp] | Freq: Once | OPHTHALMIC | Status: AC
  Administered 2016-02-22: 1 [drp] via OPHTHALMIC

## 2016-02-22 MED ORDER — MIDAZOLAM HCL 2 MG/2ML IJ SOLN
INTRAMUSCULAR | Status: DC | PRN
  Administered 2016-02-22: 1 mg via INTRAVENOUS

## 2016-02-22 MED ORDER — NA CHONDROIT SULF-NA HYALURON 40-17 MG/ML IO SOLN
INTRAOCULAR | Status: DC | PRN
  Administered 2016-02-22: 1 mL via INTRAOCULAR

## 2016-02-22 SURGICAL SUPPLY — 22 items
CANNULA ANT/CHMB 27GA (MISCELLANEOUS) ×2 IMPLANT
CUP MEDICINE 2OZ PLAST GRAD ST (MISCELLANEOUS) ×2 IMPLANT
GLOVE BIO SURGEON STRL SZ8 (GLOVE) ×2 IMPLANT
GLOVE BIOGEL M 6.5 STRL (GLOVE) ×2 IMPLANT
GLOVE SURG LX 8.0 MICRO (GLOVE) ×1
GLOVE SURG LX STRL 8.0 MICRO (GLOVE) ×1 IMPLANT
GOWN STRL REUS W/ TWL LRG LVL3 (GOWN DISPOSABLE) ×2 IMPLANT
GOWN STRL REUS W/TWL LRG LVL3 (GOWN DISPOSABLE) ×2
LENS IOL TECNIS 21.5 (Intraocular Lens) ×2 IMPLANT
LENS IOL TECNIS MONO 1P 21.5 (Intraocular Lens) ×1 IMPLANT
PACK CATARACT (MISCELLANEOUS) ×2 IMPLANT
PACK CATARACT BRASINGTON LX (MISCELLANEOUS) ×2 IMPLANT
PACK EYE AFTER SURG (MISCELLANEOUS) ×2 IMPLANT
SOL BSS BAG (MISCELLANEOUS) ×2
SOL PREP PVP 2OZ (MISCELLANEOUS) ×2
SOLUTION BSS BAG (MISCELLANEOUS) ×1 IMPLANT
SOLUTION PREP PVP 2OZ (MISCELLANEOUS) ×1 IMPLANT
SYR 3ML LL SCALE MARK (SYRINGE) ×2 IMPLANT
SYR 5ML LL (SYRINGE) ×2 IMPLANT
SYR TB 1ML 27GX1/2 LL (SYRINGE) ×2 IMPLANT
WATER STERILE IRR 1000ML POUR (IV SOLUTION) ×2 IMPLANT
WIPE NON LINTING 3.25X3.25 (MISCELLANEOUS) ×2 IMPLANT

## 2016-02-22 NOTE — Transfer of Care (Signed)
Immediate Anesthesia Transfer of Care Note  Patient: Marcia Livingston  Procedure(s) Performed: Procedure(s) with comments: CATARACT EXTRACTION PHACO AND INTRAOCULAR LENS PLACEMENT (IOC) (Left) - [place] 00:40 AP% 19.9 CDE 8.06 fluid pack lot # [id number]  Patient Location: PACU  Anesthesia Type:MAC  Level of Consciousness: awake, alert  and oriented  Airway & Oxygen Therapy: Patient Spontanous Breathing  Post-op Assessment: Report given to RN and Post -op Vital signs reviewed and stable  Post vital signs: Reviewed and stable  Last Vitals:  Filed Vitals:   02/22/16 1115 02/22/16 1303  BP: 138/77 96/77  Pulse: 68 64  Temp: 36.2 C   Resp: 16     Last Pain: There were no vitals filed for this visit.       Complications: No apparent anesthesia complications

## 2016-02-22 NOTE — Anesthesia Preprocedure Evaluation (Signed)
Anesthesia Evaluation  Patient identified by MRN, date of birth, ID band  Reviewed: Allergy & Precautions, H&P , NPO status , Patient's Chart, lab work & pertinent test results  History of Anesthesia Complications Negative for: history of anesthetic complications  Airway Mallampati: III  TM Distance: <3 FB Neck ROM: limited    Dental  (+) Caps, Poor Dentition   Pulmonary sleep apnea , former smoker,    Pulmonary exam normal breath sounds clear to auscultation       Cardiovascular Exercise Tolerance: Good hypertension, Pt. on medications + angina + CAD  Normal cardiovascular exam+ Valvular Problems/Murmurs  Rhythm:regular Rate:Normal     Neuro/Psych PSYCHIATRIC DISORDERS Anxiety Depression  Neuromuscular disease    GI/Hepatic Neg liver ROS, GERD  Medicated and Controlled,  Endo/Other  negative endocrine ROSMorbid obesity  Renal/GU negative Renal ROS     Musculoskeletal  (+) Arthritis ,   Abdominal   Peds  Hematology negative hematology ROS (+)   Anesthesia Other Findings Past Medical History:   Sleep apnea                                                  Hypertension                                                 High cholesterol                                             Osteoporosis                                                 Depression                                                   Anxiety                                                      GERD (gastroesophageal reflux disease)                       IBS (irritable bowel syndrome)                               Overactive bladder                                           CAD (coronary artery disease)  Sleep apnea                                                  Colon polyps                                                 Hyperlipidemia                                               IBS (irritable bowel syndrome)                                Fibrocystic breast disease                                   Depression                                                   Obesity                                                      Heart murmur                                                 Arthritis                                                   Past Medical History:   Sleep apnea                                                  Hypertension                                                 High cholesterol                                             Osteoporosis  Depression                                                   Anxiety                                                      GERD (gastroesophageal reflux disease)                       IBS (irritable bowel syndrome)                               Overactive bladder                                           CAD (coronary artery disease)                                Sleep apnea                                                  Colon polyps                                                 Hyperlipidemia                                               IBS (irritable bowel syndrome)                               Fibrocystic breast disease                                   Depression                                                   Obesity                                                      Heart murmur  Arthritis                                                    Cancer ([place])                                                   Comment:SKIN  BMI    Body Mass Index   38.03 kg/m 2       Reproductive/Obstetrics                             Anesthesia Physical  Anesthesia Plan  ASA: III  Anesthesia Plan: MAC   Post-op Pain Management:    Induction: Intravenous  Airway Management Planned: Nasal Cannula  Additional Equipment:    Intra-op Plan:   Post-operative Plan:   Informed Consent: I have reviewed the patients History and Physical, chart, labs and discussed the procedure including the risks, benefits and alternatives for the proposed anesthesia with the patient or authorized representative who has indicated his/her understanding and acceptance.     Plan Discussed with: Anesthesiologist, Surgeon and CRNA  Anesthesia Plan Comments:         Anesthesia Quick Evaluation

## 2016-02-22 NOTE — Discharge Instructions (Signed)
Eye Surgery Discharge Instructions  Expect mild scratchy sensation or mild soreness. DO NOT RUB YOUR EYE!  The day of surgery:  Minimal physical activity, but bed rest is not required  No reading, computer work, or close hand work  No bending, lifting, or straining.  May watch TV  For 24 hours:  No driving, legal decisions, or alcoholic beverages  Safety precautions  Eat anything you prefer: It is better to start with liquids, then soup then solid foods.  _____ Eye patch should be worn until postoperative exam tomorrow.  ____ Solar shield eyeglasses should be worn for comfort in the sunlight/patch while sleeping  Resume all regular medications including aspirin or Coumadin if these were discontinued prior to surgery. You may shower, bathe, shave, or wash your hair. Tylenol may be taken for mild discomfort.  Call your doctor if you experience significant pain, nausea, or vomiting, fever > 101 or other signs of infection. [phone] or [phone] Specific instructions:  Follow-up Information    Follow up with Tim Lair, MD.   Specialty:  Ophthalmology   Why:  02/23/16 at 9:55   Contact information:   [address] [place] [address] [phone]      Eye Surgery Discharge Instructions  Expect mild scratchy sensation or mild soreness. DO NOT RUB YOUR EYE!  The day of surgery:  Minimal physical activity, but bed rest is not required  No reading, computer work, or close hand work  No bending, lifting, or straining.  May watch TV  For 24 hours:  No driving, legal decisions, or alcoholic beverages  Safety precautions  Eat anything you prefer: It is better to start with liquids, then soup then solid foods.  _____ Eye patch should be worn until postoperative exam tomorrow.  ____ Solar shield eyeglasses should be worn for comfort in the sunlight/patch while sleeping  Resume all regular medications including aspirin or Coumadin if  these were discontinued prior to surgery. You may shower, bathe, shave, or wash your hair. Tylenol may be taken for mild discomfort.  Call your doctor if you experience significant pain, nausea, or vomiting, fever > 101 or other signs of infection. [phone] or [phone] Specific instructions:  Follow-up Information    Follow up with Tim Lair, MD.   Specialty:  Ophthalmology   Why:  02/23/16 at 9:55   Contact information:   [place] [address] [phone]

## 2016-02-22 NOTE — Op Note (Signed)
PREOPERATIVE DIAGNOSIS:  Nuclear sclerotic cataract of the left eye.   POSTOPERATIVE DIAGNOSIS:  Nuclear sclerotic cataract of the left eye.   OPERATIVE PROCEDURE: Procedure(s): CATARACT EXTRACTION PHACO AND INTRAOCULAR LENS PLACEMENT (IOC)   SURGEON:  Birder Robson, MD.   ANESTHESIA:  Anesthesiologist: Andria Frames, MD CRNA: Nelda Marseille, CRNA; Lance Muss, CRNA  1.      Managed anesthesia care. 2.      Topical tetracaine drops followed by 2% Xylocaine jelly applied in the preoperative holding area.   COMPLICATIONS:  None.   TECHNIQUE:   Stop and chop   DESCRIPTION OF PROCEDURE:  The patient was examined and consented in the preoperative holding area where the aforementioned topical anesthesia was applied to the left eye and then brought back to the Operating Room where the left eye was prepped and draped in the usual sterile ophthalmic fashion and a lid speculum was placed. A paracentesis was created with the side port blade and the anterior chamber was filled with viscoelastic. A near clear corneal incision was performed with the steel keratome. A continuous curvilinear capsulorrhexis was performed with a cystotome followed by the capsulorrhexis forceps. Hydrodissection and hydrodelineation were carried out with BSS on a blunt cannula. The lens was removed in a stop and chop  technique and the remaining cortical material was removed with the irrigation-aspiration handpiece. The capsular bag was inflated with viscoelastic and the Technis ZCB00 lens was placed in the capsular bag without complication. The remaining viscoelastic was removed from the eye with the irrigation-aspiration handpiece. The wounds were hydrated. The anterior chamber was flushed with Miostat and the eye was inflated to physiologic pressure. 0.2 mL of Vigamox diluted three/one with BSS was placed in the anterior chamber. The wounds were found to be water tight. The eye was dressed with Vigamox. The patient was given  protective glasses to wear throughout the day and a shield with which to sleep tonight. The patient was also given drops with which to begin a drop regimen today and will follow-up with me in one day.  Implant Name Type Inv. Item Serial No. Manufacturer Lot No. LRB No. Used  LENS IOL TECNIS 21.5 - [id number] Intraocular Lens LENS IOL TECNIS 21.5 [id number] Left 1    Procedure(s) with comments: CATARACT EXTRACTION PHACO AND INTRAOCULAR LENS PLACEMENT (IOC) (Left) - [place] 00:40 AP% 19.9 CDE 8.06 fluid pack lot # [id number]  Electronically signed: [place] 02/22/2016 1:02 PM

## 2016-02-22 NOTE — H&P (Signed)
  All labs reviewed. Abnormal studies sent to patients PCP when indicated.  Previous H&P reviewed, patient examined, there are NO CHANGES.  Leeza Heiner LOUIS5/9/201712:35 PM

## 2016-02-22 NOTE — Anesthesia Postprocedure Evaluation (Signed)
Anesthesia Post Note  Patient: Marcia Livingston Self  Procedure(s) Performed: Procedure(s) (LRB): CATARACT EXTRACTION PHACO AND INTRAOCULAR LENS PLACEMENT (IOC) (Left)  Patient location during evaluation: PACU Anesthesia Type: MAC Level of consciousness: awake, awake and alert and oriented Pain management: pain level controlled Vital Signs Assessment: post-procedure vital signs reviewed and stable Respiratory status: spontaneous breathing, nonlabored ventilation and respiratory function stable Cardiovascular status: stable Anesthetic complications: no    Last Vitals:  Filed Vitals:   02/22/16 1115 02/22/16 1303  BP: 138/77 96/77  Pulse: 68 64  Temp: 36.2 C   Resp: 16     Last Pain: There were no vitals filed for this visit.               [employer]

## 2016-11-08 DIAGNOSIS — M3501 Sicca syndrome with keratoconjunctivitis: Secondary | ICD-10-CM | POA: Diagnosis not present

## 2016-12-08 DIAGNOSIS — Z09 Encounter for follow-up examination after completed treatment for conditions other than malignant neoplasm: Secondary | ICD-10-CM | POA: Diagnosis not present

## 2016-12-08 DIAGNOSIS — D485 Neoplasm of uncertain behavior of skin: Secondary | ICD-10-CM | POA: Diagnosis not present

## 2016-12-08 DIAGNOSIS — L814 Other melanin hyperpigmentation: Secondary | ICD-10-CM | POA: Diagnosis not present

## 2016-12-08 DIAGNOSIS — Z85828 Personal history of other malignant neoplasm of skin: Secondary | ICD-10-CM | POA: Diagnosis not present

## 2016-12-08 DIAGNOSIS — Z08 Encounter for follow-up examination after completed treatment for malignant neoplasm: Secondary | ICD-10-CM | POA: Diagnosis not present

## 2016-12-08 DIAGNOSIS — L821 Other seborrheic keratosis: Secondary | ICD-10-CM | POA: Diagnosis not present

## 2016-12-08 DIAGNOSIS — C44712 Basal cell carcinoma of skin of right lower limb, including hip: Secondary | ICD-10-CM | POA: Diagnosis not present

## 2016-12-27 DIAGNOSIS — C44712 Basal cell carcinoma of skin of right lower limb, including hip: Secondary | ICD-10-CM | POA: Diagnosis not present

## 2017-01-02 ENCOUNTER — Other Ambulatory Visit: Payer: Self-pay | Admitting: Family Medicine

## 2017-01-02 DIAGNOSIS — F418 Other specified anxiety disorders: Secondary | ICD-10-CM | POA: Diagnosis not present

## 2017-01-02 DIAGNOSIS — Z23 Encounter for immunization: Secondary | ICD-10-CM | POA: Diagnosis not present

## 2017-01-02 DIAGNOSIS — Z1159 Encounter for screening for other viral diseases: Secondary | ICD-10-CM | POA: Diagnosis not present

## 2017-01-02 DIAGNOSIS — Z780 Asymptomatic menopausal state: Secondary | ICD-10-CM

## 2017-01-02 DIAGNOSIS — K909 Intestinal malabsorption, unspecified: Secondary | ICD-10-CM | POA: Diagnosis not present

## 2017-01-02 DIAGNOSIS — E782 Mixed hyperlipidemia: Secondary | ICD-10-CM | POA: Diagnosis not present

## 2017-01-02 DIAGNOSIS — G4733 Obstructive sleep apnea (adult) (pediatric): Secondary | ICD-10-CM | POA: Diagnosis not present

## 2017-01-02 DIAGNOSIS — Z9989 Dependence on other enabling machines and devices: Secondary | ICD-10-CM | POA: Diagnosis not present

## 2017-01-02 DIAGNOSIS — Z1231 Encounter for screening mammogram for malignant neoplasm of breast: Secondary | ICD-10-CM | POA: Diagnosis not present

## 2017-01-02 DIAGNOSIS — I10 Essential (primary) hypertension: Secondary | ICD-10-CM | POA: Diagnosis not present

## 2017-01-02 DIAGNOSIS — Z0000 Encounter for general adult medical examination without abnormal findings: Secondary | ICD-10-CM | POA: Diagnosis not present

## 2017-01-22 ENCOUNTER — Other Ambulatory Visit: Payer: Self-pay | Admitting: Family Medicine

## 2017-01-22 DIAGNOSIS — Z1231 Encounter for screening mammogram for malignant neoplasm of breast: Secondary | ICD-10-CM

## 2017-01-30 DIAGNOSIS — Z23 Encounter for immunization: Secondary | ICD-10-CM | POA: Diagnosis not present

## 2017-02-06 DIAGNOSIS — F411 Generalized anxiety disorder: Secondary | ICD-10-CM | POA: Diagnosis not present

## 2017-02-06 DIAGNOSIS — E782 Mixed hyperlipidemia: Secondary | ICD-10-CM | POA: Diagnosis not present

## 2017-02-06 DIAGNOSIS — F3341 Major depressive disorder, recurrent, in partial remission: Secondary | ICD-10-CM | POA: Diagnosis not present

## 2017-02-06 DIAGNOSIS — Z6841 Body Mass Index (BMI) 40.0 and over, adult: Secondary | ICD-10-CM | POA: Diagnosis not present

## 2017-02-28 ENCOUNTER — Ambulatory Visit
Admission: RE | Admit: 2017-02-28 | Discharge: 2017-02-28 | Disposition: A | Discharge status: Home / Self Care | Payer: Medicare Other | Source: Ambulatory Visit | Attending: Family Medicine | Admitting: Family Medicine

## 2017-02-28 DIAGNOSIS — M8588 Other specified disorders of bone density and structure, other site: Secondary | ICD-10-CM | POA: Diagnosis not present

## 2017-02-28 DIAGNOSIS — Z1231 Encounter for screening mammogram for malignant neoplasm of breast: Secondary | ICD-10-CM

## 2017-02-28 DIAGNOSIS — Z780 Asymptomatic menopausal state: Secondary | ICD-10-CM | POA: Insufficient documentation

## 2017-02-28 DIAGNOSIS — M85852 Other specified disorders of bone density and structure, left thigh: Secondary | ICD-10-CM | POA: Insufficient documentation

## 2017-04-02 DIAGNOSIS — G4733 Obstructive sleep apnea (adult) (pediatric): Secondary | ICD-10-CM | POA: Diagnosis not present

## 2017-04-02 DIAGNOSIS — E782 Mixed hyperlipidemia: Secondary | ICD-10-CM | POA: Diagnosis not present

## 2017-04-02 DIAGNOSIS — Z9889 Other specified postprocedural states: Secondary | ICD-10-CM | POA: Diagnosis not present

## 2017-04-02 DIAGNOSIS — I350 Nonrheumatic aortic (valve) stenosis: Secondary | ICD-10-CM | POA: Diagnosis not present

## 2017-04-02 DIAGNOSIS — I10 Essential (primary) hypertension: Secondary | ICD-10-CM | POA: Diagnosis not present

## 2017-04-02 DIAGNOSIS — Z9989 Dependence on other enabling machines and devices: Secondary | ICD-10-CM | POA: Diagnosis not present

## 2017-04-26 DIAGNOSIS — Z79899 Other long term (current) drug therapy: Secondary | ICD-10-CM | POA: Diagnosis not present

## 2017-04-30 DIAGNOSIS — D225 Melanocytic nevi of trunk: Secondary | ICD-10-CM | POA: Diagnosis not present

## 2017-04-30 DIAGNOSIS — D2261 Melanocytic nevi of right upper limb, including shoulder: Secondary | ICD-10-CM | POA: Diagnosis not present

## 2017-04-30 DIAGNOSIS — D485 Neoplasm of uncertain behavior of skin: Secondary | ICD-10-CM | POA: Diagnosis not present

## 2017-04-30 DIAGNOSIS — C44712 Basal cell carcinoma of skin of right lower limb, including hip: Secondary | ICD-10-CM | POA: Diagnosis not present

## 2017-04-30 DIAGNOSIS — Z85828 Personal history of other malignant neoplasm of skin: Secondary | ICD-10-CM | POA: Diagnosis not present

## 2017-04-30 DIAGNOSIS — L821 Other seborrheic keratosis: Secondary | ICD-10-CM | POA: Diagnosis not present

## 2017-05-16 DIAGNOSIS — C44712 Basal cell carcinoma of skin of right lower limb, including hip: Secondary | ICD-10-CM | POA: Diagnosis not present

## 2017-05-16 HISTORY — PX: SKIN CANCER EXCISION: SHX779

## 2017-05-19 ENCOUNTER — Emergency Department
Admission: EM | Admit: 2017-05-19 | Discharge: 2017-05-19 | Disposition: A | Discharge status: Home / Self Care | Payer: Medicare Other | Attending: Emergency Medicine | Admitting: Emergency Medicine

## 2017-05-19 ENCOUNTER — Encounter: Payer: Self-pay | Admitting: Emergency Medicine

## 2017-05-19 DIAGNOSIS — Z483 Aftercare following surgery for neoplasm: Secondary | ICD-10-CM | POA: Insufficient documentation

## 2017-05-19 DIAGNOSIS — Z5189 Encounter for other specified aftercare: Secondary | ICD-10-CM

## 2017-05-19 DIAGNOSIS — R2241 Localized swelling, mass and lump, right lower limb: Secondary | ICD-10-CM | POA: Diagnosis present

## 2017-05-19 DIAGNOSIS — I10 Essential (primary) hypertension: Secondary | ICD-10-CM | POA: Diagnosis not present

## 2017-05-19 DIAGNOSIS — C44712 Basal cell carcinoma of skin of right lower limb, including hip: Secondary | ICD-10-CM | POA: Insufficient documentation

## 2017-05-19 DIAGNOSIS — Z79899 Other long term (current) drug therapy: Secondary | ICD-10-CM | POA: Insufficient documentation

## 2017-05-19 DIAGNOSIS — Z4800 Encounter for change or removal of nonsurgical wound dressing: Secondary | ICD-10-CM | POA: Diagnosis not present

## 2017-05-19 DIAGNOSIS — Z87891 Personal history of nicotine dependence: Secondary | ICD-10-CM | POA: Insufficient documentation

## 2017-05-19 DIAGNOSIS — L03115 Cellulitis of right lower limb: Secondary | ICD-10-CM | POA: Diagnosis not present

## 2017-05-19 DIAGNOSIS — Z7982 Long term (current) use of aspirin: Secondary | ICD-10-CM | POA: Insufficient documentation

## 2017-05-19 DIAGNOSIS — I2510 Atherosclerotic heart disease of native coronary artery without angina pectoris: Secondary | ICD-10-CM | POA: Insufficient documentation

## 2017-05-19 MED ORDER — TROPICAMIDE 0.5 % OP SOLN: 2.0000 [drp] | Freq: Once | OPHTHALMIC | Status: DC

## 2017-05-19 MED ORDER — CLINDAMYCIN HCL 150 MG PO CAPS
300.0000 mg | ORAL_CAPSULE | Freq: Once | ORAL | Status: AC
  Administered 2017-05-19: 300 mg via ORAL
  Filled 2017-05-19: qty 2

## 2017-05-19 MED ORDER — CLINDAMYCIN HCL 300 MG PO CAPS: 300.0000 mg | ORAL_CAPSULE | Freq: Four times a day (QID) | ORAL | 0 refills | Status: DC

## 2017-05-19 NOTE — ED Triage Notes (Signed)
Patient has skin cancer excised last week and today presents with redness about the size of a dime.  It is clearly red and pt is co itching around the site, she is afebrile and declines any pain.

## 2017-05-19 NOTE — ED Provider Notes (Signed)
[facility] Emergency Department Provider Note  ____________________________________________  Time seen: Approximately 9:33 PM  I have reviewed the triage vital signs and the nursing notes.   HISTORY  Chief Complaint Wound Check    HPI Marcia Livingston is a 71 y.o. female who presents to emergency department complaining of redness and swelling to right lower shin me. 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 She reports 2 areas or exercise. This morning, areas were pink, and nonpainful. This afternoon, when she took her bandages off a were angry red. No posterior drainage. Mild tenderness to palpation but no constant pain. No fevers or chills. No other complaints at this time.   Past Medical History:  Diagnosis Date  . Anxiety   . Arthritis   . CAD (coronary artery disease)   . Cancer ([place])    SKIN  . Colon polyps   . Depression   . Depression   . Fibrocystic breast disease   . GERD (gastroesophageal reflux disease)   . Heart murmur   . High cholesterol   . Hyperlipidemia   . Hypertension   . IBS (irritable bowel syndrome)   . IBS (irritable bowel syndrome)   . Obesity   . Osteoporosis   . Overactive bladder   . Sleep apnea   . Sleep apnea     Patient Active Problem List   Diagnosis Date Noted  . Angina pectoris ([place]) 10/22/2015  . HTN (hypertension) 10/22/2015  . High cholesterol 10/22/2015  . Anxiety 10/22/2015  . Depression 10/22/2015  . CAD (coronary artery disease) 10/22/2015    Past Surgical History:  Procedure Laterality Date  . ABDOMINAL HYSTERECTOMY    . APPENDECTOMY    . BREAST EXCISIONAL BIOPSY Right YRS AGO   NEG  . CATARACT EXTRACTION W/PHACO Right 02/01/2016   Procedure: CATARACT EXTRACTION PHACO AND INTRAOCULAR LENS PLACEMENT (IOC);  Surgeon: Birder Robson, MD;  Location: [facility] ORS;  Service: Ophthalmology;  Laterality: Right;  [place]    00:37.2 AP%  20.7% CDE   7.69 fluid pack lot # [id number]  . CATARACT EXTRACTION W/PHACO Left 02/22/2016   Procedure: CATARACT EXTRACTION PHACO AND INTRAOCULAR LENS PLACEMENT (IOC);  Surgeon: Birder Robson, MD;  Location: [facility] ORS;  Service: Ophthalmology;  Laterality: Left;  [place] 00:40 AP% 19.9 CDE 8.06 fluid pack lot # [id number]  . CHOLECYSTECTOMY    . COLONOSCOPY WITH PROPOFOL N/A 01/13/2016   Procedure: COLONOSCOPY WITH PROPOFOL;  Surgeon: Manya Silvas, MD;  Location: [facility] ENDOSCOPY;  Service: Endoscopy;  Laterality: N/A;  . FOOT SURGERY Left   . SKIN CANCER EXCISION Right 05/16/2017   Right Shin    Prior to Admission medications   Medication Sig Start Date End Date Taking? Authorizing Provider  ALPRAZolam Duanne Moron) 0.25 MG tablet Take 0.5 mg by mouth at bedtime as needed for anxiety.     [provider]  aspirin EC 81 MG tablet Take 81 mg by mouth daily.    [provider]  buPROPion (WELLBUTRIN XL) 150 MG 24 hr tablet Take 150 mg by mouth daily.    [provider]  Cholecalciferol 10000 units CAPS Take 1 capsule by mouth daily.    [provider]  clindamycin (CLEOCIN) 300 MG capsule Take 1 capsule (300 mg total) by mouth 4 (four) times daily. 05/19/17   Mabry Tift, Charline Bills, PA-C  colestipol (COLESTID) 1 g tablet Take 1 g by mouth daily.     [provider]  escitalopram (LEXAPRO) 20 MG tablet Take 20 mg by mouth daily. Reported on 01/13/2016    [provider]  hydrochlorothiazide (HYDRODIURIL) 25 MG tablet Take 25 mg by mouth daily.    [provider]  rosuvastatin (CRESTOR) 5 MG tablet Take 5 mg by mouth 3 (three) times a week.    [provider]    Allergies Penicillins; Codeine; and Sulfa antibiotics  Family History  Problem Relation Age of Onset  . Colon cancer Mother   . Stroke Mother   . Hypertension Mother   . Heart attack Mother   . Breast cancer Mother   . Emphysema Father   . Prostate cancer Father     Social  History Social History  Substance Use Topics  . Smoking status: Former Research scientist (life sciences)  . Smokeless tobacco: Never Used  . Alcohol use Yes     Comment: OCC     Review of Systems  Constitutional: No fever/chills Eyes: No visual changes.  Cardiovascular: no chest pain. Respiratory: no cough. No SOB. Gastrointestinal: No abdominal pain.  No nausea, no vomiting.  Musculoskeletal: Negative for musculoskeletal pain. Skin: Negative for rash, abrasions, lacerations, ecchymosis. 2 excisional sites to right lower extremity with surrounding erythema. Neurological: Negative for headaches, focal weakness or numbness. 10-point ROS otherwise negative.  ____________________________________________   PHYSICAL EXAM:  VITAL SIGNS: ED Triage Vitals  Enc Vitals Group     BP 05/19/17 2040 139/64     Pulse Rate 05/19/17 2040 86     Resp 05/19/17 2040 16     Temp 05/19/17 2040 98.2 F (36.8 C)     Temp Source 05/19/17 2040 Oral     SpO2 05/19/17 2040 95 %     Weight 05/19/17 2041 213 lb (96.6 kg)     Height 05/19/17 2041 5\' 1"  (1.549 m)     Head Circumference --      Peak Flow --      Pain Score 05/19/17 2040 0     Pain Loc --      Pain Edu? --      Excl. in [place]? --      Constitutional: Alert and oriented. Well appearing and in no acute distress. Eyes: Conjunctivae are normal. PERRL. EOMI. Head: Atraumatic. Neck: No stridor.   Cardiovascular: Normal rate, regular rhythm. Normal S1 and S2.  Good peripheral circulation. Respiratory: Normal respiratory effort without tachypnea or retractions. Lungs CTAB. Good air entry to the bases with no decreased or absent breath sounds. Musculoskeletal: Full range of motion to all extremities. No gross deformities appreciated. Neurologic:  Normal speech and language. No gross focal neurologic deficits are appreciated.  Skin:  Skin is warm, dry and intact. No rash noted.2 excisional site 7 to right lower show any. Largest is approximately 2.5 cm in diameter.  Both are surrounded with brightly erythematous and mildly edematous skin. Areas are mildly tender to palpation. No pustular drainage at this time. Central excision region appears to be healing appropriately. No induration or fluctuance. Psychiatric: Mood and affect are normal. Speech and behavior are normal. Patient exhibits appropriate insight and judgement.   ____________________________________________   LABS (all labs ordered are listed, but only abnormal results are displayed)  Labs Reviewed - No data to display ____________________________________________  EKG   ____________________________________________  RADIOLOGY   No results found.  ____________________________________________    PROCEDURES  Procedure(s) performed:    Procedures    Medications  clindamycin (CLEOCIN) capsule 300 mg (300 mg Oral Given 05/19/17 2138)  ____________________________________________   INITIAL IMPRESSION / ASSESSMENT AND PLAN / ED COURSE  Pertinent labs & imaging results that were available during my care of the patient were reviewed by me and considered in my medical decision making (see chart for details).  Review of the [place] CSRS was performed in accordance of the [place] prior to dispensing any controlled drugs.     Patient's diagnosis is consistent with cellulitis surrounding excisional biopsy site. Patient had procedure 4 days ago. This morning areas were pink and healing properly. This afternoon sites were actually erythematous and tender to palpation. On exam, area does appear to be slightly infected. Patient is allergic to penicillins and sulfa antibiotics. As such, she'll be placed on clindamycin.. Patient's daughter is an Therapist, sports and will keep a close eye on sites. No indication at this time for imaging or labs. No indication of abscess. Patient will follow up primary care as needed. Patient is given ED precautions to return to the ED for any worsening or new  symptoms.     ____________________________________________  FINAL CLINICAL IMPRESSION(S) / ED DIAGNOSES  Final diagnoses:  Visit for wound check  Cellulitis of right lower extremity      NEW MEDICATIONS STARTED DURING THIS VISIT:  Discharge Medication List as of 05/19/2017  9:35 PM    START taking these medications   Details  clindamycin (CLEOCIN) 300 MG capsule Take 1 capsule (300 mg total) by mouth 4 (four) times daily., Starting Sat 05/19/2017, Print            This chart was dictated using voice recognition software/Dragon. Despite best efforts to proofread, errors can occur which can change the meaning. Any change was purely unintentional.    Darletta Moll, PA-C 05/20/17 0027    Orbie Pyo, MD 05/20/17 2226

## 2017-07-30 DIAGNOSIS — Z23 Encounter for immunization: Secondary | ICD-10-CM | POA: Diagnosis not present

## 2017-08-10 DIAGNOSIS — G4733 Obstructive sleep apnea (adult) (pediatric): Secondary | ICD-10-CM | POA: Diagnosis not present

## 2017-08-10 DIAGNOSIS — Z9989 Dependence on other enabling machines and devices: Secondary | ICD-10-CM | POA: Diagnosis not present

## 2017-08-10 DIAGNOSIS — F419 Anxiety disorder, unspecified: Secondary | ICD-10-CM | POA: Diagnosis not present

## 2017-08-10 DIAGNOSIS — F329 Major depressive disorder, single episode, unspecified: Secondary | ICD-10-CM | POA: Diagnosis not present

## 2017-08-10 DIAGNOSIS — R42 Dizziness and giddiness: Secondary | ICD-10-CM | POA: Diagnosis not present

## 2017-09-03 DIAGNOSIS — R42 Dizziness and giddiness: Secondary | ICD-10-CM | POA: Diagnosis not present

## 2017-09-10 DIAGNOSIS — R2681 Unsteadiness on feet: Secondary | ICD-10-CM | POA: Diagnosis not present

## 2017-10-02 DIAGNOSIS — Z9989 Dependence on other enabling machines and devices: Secondary | ICD-10-CM | POA: Diagnosis not present

## 2017-10-02 DIAGNOSIS — I10 Essential (primary) hypertension: Secondary | ICD-10-CM | POA: Diagnosis not present

## 2017-10-02 DIAGNOSIS — R609 Edema, unspecified: Secondary | ICD-10-CM | POA: Diagnosis not present

## 2017-10-02 DIAGNOSIS — G4733 Obstructive sleep apnea (adult) (pediatric): Secondary | ICD-10-CM | POA: Diagnosis not present

## 2017-10-02 DIAGNOSIS — E782 Mixed hyperlipidemia: Secondary | ICD-10-CM | POA: Diagnosis not present

## 2017-10-02 DIAGNOSIS — Z9889 Other specified postprocedural states: Secondary | ICD-10-CM | POA: Diagnosis not present

## 2017-10-02 DIAGNOSIS — I350 Nonrheumatic aortic (valve) stenosis: Secondary | ICD-10-CM | POA: Diagnosis not present

## 2017-10-12 DIAGNOSIS — F419 Anxiety disorder, unspecified: Secondary | ICD-10-CM | POA: Diagnosis not present

## 2017-10-12 DIAGNOSIS — F329 Major depressive disorder, single episode, unspecified: Secondary | ICD-10-CM | POA: Diagnosis not present

## 2017-10-15 DIAGNOSIS — I350 Nonrheumatic aortic (valve) stenosis: Secondary | ICD-10-CM | POA: Diagnosis not present

## 2017-11-20 DIAGNOSIS — J209 Acute bronchitis, unspecified: Secondary | ICD-10-CM | POA: Diagnosis not present

## 2018-01-10 DIAGNOSIS — R05 Cough: Secondary | ICD-10-CM | POA: Diagnosis not present

## 2018-01-16 ENCOUNTER — Encounter: Payer: Self-pay | Admitting: Family Medicine

## 2018-01-16 ENCOUNTER — Ambulatory Visit (INDEPENDENT_AMBULATORY_CARE_PROVIDER_SITE_OTHER): Payer: Medicare Other | Admitting: Family Medicine

## 2018-01-16 VITALS — BP 154/72 | HR 76 | Temp 98.1°F | Resp 16 | Ht 61.5 in | Wt 217.0 lb

## 2018-01-16 DIAGNOSIS — F419 Anxiety disorder, unspecified: Secondary | ICD-10-CM | POA: Diagnosis not present

## 2018-01-16 DIAGNOSIS — E7800 Pure hypercholesterolemia, unspecified: Secondary | ICD-10-CM | POA: Diagnosis not present

## 2018-01-16 DIAGNOSIS — I2510 Atherosclerotic heart disease of native coronary artery without angina pectoris: Secondary | ICD-10-CM

## 2018-01-16 DIAGNOSIS — K219 Gastro-esophageal reflux disease without esophagitis: Secondary | ICD-10-CM

## 2018-01-16 DIAGNOSIS — G4733 Obstructive sleep apnea (adult) (pediatric): Secondary | ICD-10-CM | POA: Diagnosis not present

## 2018-01-16 DIAGNOSIS — F331 Major depressive disorder, recurrent, moderate: Secondary | ICD-10-CM

## 2018-01-16 DIAGNOSIS — I350 Nonrheumatic aortic (valve) stenosis: Secondary | ICD-10-CM

## 2018-01-16 DIAGNOSIS — I10 Essential (primary) hypertension: Secondary | ICD-10-CM

## 2018-01-16 DIAGNOSIS — G4700 Insomnia, unspecified: Secondary | ICD-10-CM

## 2018-01-16 NOTE — Assessment & Plan Note (Signed)
Moderate control Patient reports this is improving since starting Cymbalta recently Continue current medications Discussed the dangers and addictive nature of Xanax especially in elderly patients Patient plans to quit and we will not plan to refill this medication Discussed the importance of therapy in addition to medications If anxiety continues to be prominent, may need to consider discontinuing Wellbutrin in the future

## 2018-01-16 NOTE — Assessment & Plan Note (Signed)
Continue to follow with cardiology Advised on low-cholesterol diet Continue baby aspirin daily

## 2018-01-16 NOTE — Assessment & Plan Note (Signed)
Moderate control Patient reports this is improving since starting Cymbalta Continue current medications Advised on the importance of therapy in conjunction with medications Repeat PHQ 9 in 6 months

## 2018-01-16 NOTE — Assessment & Plan Note (Signed)
Uncontrolled currently, but previously well controlled   continue current medications which she has not taken yet today and will take when she gets home  she is following up with cardiology next month she can have her blood pressure rechecked at that time We will follow-up in 6 months or sooner as needed

## 2018-01-16 NOTE — Progress Notes (Signed)
Patient: Marcia Livingston, Female    DOB: Feb 14, 1946, 72 y.o.   MRN: [id number] Visit Date: 01/16/2018  Today's Provider: Lavon Paganini, MD   Chief Complaint  Patient presents with  . Establish Care   Subjective:    [place] is a 72 y.o. female who presents today to establish care. She feels fairly well. She states the pollen caused her to have a headache. She reports exercising none, currently. She reports she is sleeping well, with Trazodone.  She also has OSA and is using CPAP regularly.  States she has difficulty falling asleep without the trazodone.  Pt's previous PCP at [facility] is moving back to [place], so pt wanted to establish care at an office that is closer to her home. Pt's PMH includes HTN, hyperlipidemia, OSA (uses CPAP), anxiety, depression, insomnia, skin cancer (non-melanoma) - sees derm q3 months for skin checks, GERD -reportedly well controlled on ranitidine, mild aortic stenosis.  Patient is Followed by Cardiology (Dr Saralyn Pilar) q6 months for CAD (no history of bypass or stenting), mild aortic stenosis, hyperlipidemia.  She is taking Crestor daily without any side effects.  She is also taking baby aspirin daily.  HTN: Reports that her blood pressure is usually well controlled.  She is taking losartan 50 mg daily and HCTZ 25 mg daily with good compliance.  She was not able to take her medications before leaving the house this morning and was feeling anxious about meeting her new physician.  Denies any SOB, CP, vision changes, LE edema, medication SEs, or symptoms of hypotension.  Depression/anxiety: She was previously being treated by her PCP.  She is taking Cymbalta and Wellbutrin.  She was recently switched from Lexapro to Cymbalta and feels that this is helped her anxiety significantly.  She has been prescribed Xanax to use as needed for significant episodes of anxiety.  She states she is taking this less than once a week, she does not carry it  around, and she has not taken it more than a month.  She states that "I fly off easy," and knows that no medication can help with this.  Last colonoscopy- 01/13/2016- tubular adenoma x 2. Internal hemorrhoids.  Last mammogram- 02/28/2017- BI-RADS 1 Last BMD- 02/28/2017- osteopenia Recent lipid panel, CMP, CBC, glucose from [facility] reviewed and abstracted into our chart -----------------------------------------------------------------   Review of Systems  Constitutional: Positive for diaphoresis and fatigue.  HENT: Positive for mouth sores and postnasal drip.   Eyes: Positive for redness and itching.  Respiratory: Positive for shortness of breath and wheezing.   Gastrointestinal: Positive for diarrhea and nausea.  Endocrine: Positive for heat intolerance and polydipsia.  Genitourinary: Positive for urgency.  Musculoskeletal: Positive for arthralgias, back pain and neck stiffness.  Allergic/Immunologic: Positive for environmental allergies.  Neurological: Positive for dizziness and headaches.  Psychiatric/Behavioral: Positive for agitation and decreased concentration.    Social History      She  reports that she quit smoking about 21 years ago. Her smoking use included cigarettes. She has a 30.00 pack-year smoking history. She has never used smokeless tobacco. She reports that she drinks alcohol. She reports that she does not use drugs.       Social History   Socioeconomic History  . Marital status: Married    Spouse name: Orpah Cobb. [place]  . Number of children: 1  . Years of education: 40  . Highest education level: High school graduate  Occupational History  . Occupation: Retired  Comment: book-keeper  Social Needs  . Financial resource strain: Not hard at all  . Food insecurity:    Worry: Never true    Inability: Never true  . Transportation needs:    Medical: No    Non-medical: No  Tobacco Use  . Smoking status: Former Smoker    Packs/day: 1.00    Years: 30.00    Pack  years: 30.00    Types: Cigarettes    Last attempt to quit: 10/15/1996    Years since quitting: 21.2  . Smokeless tobacco: Never Used  Substance and Sexual Activity  . Alcohol use: Yes    Comment: occasional wine, <1 per month  . Drug use: No  . Sexual activity: Not Currently    Partners: Male    Birth control/protection: Surgical  Lifestyle  . Physical activity:    Days per week: 0 days    Minutes per session: 0 min  . Stress: Not on file  Relationships  . Social connections:    Talks on phone: Not on file    Gets together: Not on file    Attends religious service: Not on file    Active member of club or organization: Not on file    Attends meetings of clubs or organizations: Not on file    Relationship status: Not on file  Other Topics Concern  . Not on file  Social History Narrative   Pt states she has raised her granddaughter, and considers the granddaughter her child.    Past Medical History:  Diagnosis Date  . Anxiety   . Arthritis   . CAD (coronary artery disease)   . Cancer ([place])    SKIN  . Colon polyps   . Depression   . Depression   . Fibrocystic breast disease   . GERD (gastroesophageal reflux disease)   . Heart murmur   . High cholesterol   . Hyperlipidemia   . Hypertension   . IBS (irritable bowel syndrome)   . IBS (irritable bowel syndrome)   . Obesity   . Osteoporosis   . Overactive bladder   . Sleep apnea   . Sleep apnea      Patient Active Problem List   Diagnosis Date Noted  . OSA (obstructive sleep apnea) 01/16/2018  . GERD (gastroesophageal reflux disease) 01/16/2018  . Insomnia 01/16/2018  . Mild aortic stenosis 01/16/2018  . Angina pectoris ([place]) 10/22/2015  . HTN (hypertension) 10/22/2015  . High cholesterol 10/22/2015  . Anxiety 10/22/2015  . Depression 10/22/2015  . CAD (coronary artery disease) 10/22/2015    Past Surgical History:  Procedure Laterality Date  . ABDOMINAL HYSTERECTOMY     total  . APPENDECTOMY    .  BREAST EXCISIONAL BIOPSY Right YRS AGO   NEG  . CATARACT EXTRACTION W/PHACO Right 02/01/2016   Procedure: CATARACT EXTRACTION PHACO AND INTRAOCULAR LENS PLACEMENT (IOC);  Surgeon: Birder Robson, MD;  Location: [facility] ORS;  Service: Ophthalmology;  Laterality: Right;  [place]    00:37.2 AP%  20.7% CDE  7.69 fluid pack lot # [id number]  . CATARACT EXTRACTION W/PHACO Left 02/22/2016   Procedure: CATARACT EXTRACTION PHACO AND INTRAOCULAR LENS PLACEMENT (IOC);  Surgeon: Birder Robson, MD;  Location: [facility] ORS;  Service: Ophthalmology;  Laterality: Left;  [place] 00:40 AP% 19.9 CDE 8.06 fluid pack lot # [id number]  . CHOLECYSTECTOMY    . COLONOSCOPY WITH PROPOFOL N/A 01/13/2016   Procedure: COLONOSCOPY WITH PROPOFOL;  Surgeon: Manya Silvas, MD;  Location: [facility] ENDOSCOPY;  Service: Endoscopy;  Laterality: N/A;  . FOOT SURGERY Left    for breakdown of arch  . OVARIAN CYST SURGERY    . SKIN CANCER EXCISION Right 05/16/2017   Right Shin    Family History        Family Status  Relation Name Status  . Mother  Deceased  . Father  Deceased  . Daughter  Deceased  . Neg Hx  (Not Specified)        Her family history includes Breast cancer in her mother; Colon cancer in her mother; Emphysema in her father; Heart attack in her mother; Hypertension in her mother; Prostate cancer in her father; Stroke in her mother; Throat cancer in her daughter. There is no history of Ovarian cancer.      Allergies  Allergen Reactions  . Penicillins Rash    Has patient had a PCN reaction causing immediate rash, facial/tongue/throat swelling, SOB or lightheadedness with hypotension: WVP:[id number]} Has patient had a PCN reaction causing severe rash involving mucus membranes or skin necrosis: no:[id number]} Has patient had a PCN reaction that required hospitalization no:[id number]} Has patient had a PCN reaction occurring within the last 10 years: no:[id number]} If all of the above answers are "NO", then may proceed with  Cephalosporin use.   . Codeine Hives  . Sulfa Antibiotics Hives     Current Outpatient Medications:  .  aspirin EC 81 MG tablet, Take 81 mg by mouth daily., Disp: , Rfl:  .  buPROPion (WELLBUTRIN XL) 150 MG 24 hr tablet, Take 150 mg by mouth daily., Disp: , Rfl:  .  cholecalciferol (VITAMIN D) 1000 units tablet, Take 1,000 Units by mouth daily., Disp: , Rfl:  .  colestipol (COLESTID) 1 g tablet, Take 1 g by mouth daily. , Disp: , Rfl:  .  DULoxetine (CYMBALTA) 60 MG capsule, Take by mouth., Disp: , Rfl:  .  hydrochlorothiazide (HYDRODIURIL) 25 MG tablet, Take 50 mg by mouth daily. , Disp: , Rfl:  .  losartan (COZAAR) 50 MG tablet, Take by mouth., Disp: , Rfl:  .  ranitidine (ZANTAC) 150 MG tablet, Take by mouth., Disp: , Rfl:  .  rosuvastatin (CRESTOR) 5 MG tablet, Take 5 mg by mouth 3 (three) times a week., Disp: , Rfl:  .  traZODone (DESYREL) 50 MG tablet, TAKE 1 TABLET BY MOUTH  NIGHTLY, Disp: , Rfl:  .  ALPRAZolam (XANAX) 0.25 MG tablet, Take 0.5 mg by mouth at bedtime as needed for anxiety. , Disp: , Rfl:    Patient Care Team: Virginia Crews, MD as PCP - General (Family Medicine)      Objective:   Vitals: BP (!) 154/72 (BP Location: Left Arm, Patient Position: Sitting, Cuff Size: Large) Comment: has not taken BP meds today  Pulse 76   Temp 98.1 F (36.7 C) (Oral)   Resp 16   Ht 5' 1.5" (1.562 m)   Wt 217 lb (98.4 kg)   SpO2 96%   BMI 40.34 kg/m    Vitals:   01/16/18 1017  BP: (!) 154/72  Pulse: 76  Resp: 16  Temp: 98.1 F (36.7 C)  TempSrc: Oral  SpO2: 96%  Weight: 217 lb (98.4 kg)  Height: 5' 1.5" (1.562 m)     Physical Exam  Constitutional: She is oriented to person, place, and time. She appears well-developed and well-nourished. No distress.  HENT:  Head: Normocephalic and atraumatic.  Right Ear: External ear normal.  Left Ear: External ear normal.  Nose: Nose normal.  Mouth/Throat: Oropharynx  is clear and moist.  Eyes: Pupils are equal, round,  and reactive to light. Conjunctivae and EOM are normal. No scleral icterus.  Neck: Neck supple. No thyromegaly present.  Cardiovascular: Normal rate, regular rhythm and intact distal pulses.  Murmur heard. Pulmonary/Chest: Breath sounds normal. No respiratory distress. She has no wheezes. She has no rales.  Abdominal: Soft. Bowel sounds are normal. She exhibits no distension. There is no tenderness. There is no rebound and no guarding.  Musculoskeletal: She exhibits no edema or deformity.  Lymphadenopathy:    She has no cervical adenopathy.  Neurological: She is alert and oriented to person, place, and time. She has normal strength. No cranial nerve deficit or sensory deficit. She exhibits normal muscle tone. Gait normal.  Skin: Skin is warm and dry. No rash noted.  Psychiatric: She has a normal mood and affect. Her behavior is normal.  Vitals reviewed.    Depression Screen PHQ 2/9 Scores 01/16/2018  PHQ - 2 Score 1  PHQ- 9 Score 2      GAD 7 : Generalized Anxiety Score 01/16/2018  Nervous, Anxious, on Edge 2  Control/stop worrying 0  Worry too much - different things 3  Trouble relaxing 3  Restless 2  Easily annoyed or irritable 3  Afraid - awful might happen 1  Total GAD 7 Score 14  Anxiety Difficulty Somewhat difficult      Assessment & Plan:    Problem List Items Addressed This Visit      Cardiovascular and Mediastinum   HTN (hypertension)    Uncontrolled currently, but previously well controlled   continue current medications which she has not taken yet today and will take when she gets home  she is following up with cardiology next month she can have her blood pressure rechecked at that time We will follow-up in 6 months or sooner as needed      Relevant Medications   losartan (COZAAR) 50 MG tablet   CAD (coronary artery disease) - Primary    Continue to follow with cardiology Advised on low-cholesterol diet Continue baby aspirin daily      Relevant  Medications   losartan (COZAAR) 50 MG tablet   Mild aortic stenosis    Followed by cardiology every 6 months Murmur appreciated today Continue to monitor      Relevant Medications   losartan (COZAAR) 50 MG tablet     Respiratory   OSA (obstructive sleep apnea)    Well-controlled Continue CPAP        Digestive   GERD (gastroesophageal reflux disease)    Well-controlled Continue ranitidine      Relevant Medications   ranitidine (ZANTAC) 150 MG tablet     Other   High cholesterol    Well-controlled on last lipid panel Continue Crestor 3 times weekly Advised patient on the importance of good cholesterol control given her CAD      Relevant Medications   losartan (COZAAR) 50 MG tablet   Anxiety    Moderate control Patient reports this is improving since starting Cymbalta recently Continue current medications Discussed the dangers and addictive nature of Xanax especially in elderly patients Patient plans to quit and we will not plan to refill this medication Discussed the importance of therapy in addition to medications If anxiety continues to be prominent, may need to consider discontinuing Wellbutrin in the future      Relevant Medications   DULoxetine (CYMBALTA) 60 MG capsule   traZODone (DESYREL) 50 MG tablet   Depression  Moderate control Patient reports this is improving since starting Cymbalta Continue current medications Advised on the importance of therapy in conjunction with medications Repeat PHQ 9 in 6 months      Relevant Medications   DULoxetine (CYMBALTA) 60 MG capsule   traZODone (DESYREL) 50 MG tablet   Insomnia    Well-controlled Continue trazodone          Return in about 6 months (around 07/18/2018) for physical/AWV.   The entirety of the information documented in the History of Present Illness, Review of Systems and Physical Exam were personally obtained by me. Portions of this information were initially documented by Raquel Sarna  [place], CMA and reviewed by me for thoroughness and accuracy.    Virginia Crews, MD, MPH [facility] 01/16/2018 12:02 PM

## 2018-01-16 NOTE — Assessment & Plan Note (Signed)
Well-controlled on last lipid panel Continue Crestor 3 times weekly Advised patient on the importance of good cholesterol control given her CAD

## 2018-01-16 NOTE — Assessment & Plan Note (Signed)
Followed by cardiology every 6 months Murmur appreciated today Continue to monitor

## 2018-01-16 NOTE — Assessment & Plan Note (Signed)
Well-controlled.  Continue CPAP. 

## 2018-01-16 NOTE — Assessment & Plan Note (Signed)
Well controlled Continue trazodone 

## 2018-01-16 NOTE — Assessment & Plan Note (Signed)
Well controlled Continue ranitidine 

## 2018-02-15 ENCOUNTER — Ambulatory Visit (INDEPENDENT_AMBULATORY_CARE_PROVIDER_SITE_OTHER): Payer: Medicare Other | Admitting: Family Medicine

## 2018-02-15 ENCOUNTER — Encounter: Payer: Self-pay | Admitting: Family Medicine

## 2018-02-15 VITALS — BP 144/64 | HR 86 | Temp 98.1°F | Resp 16 | Wt 221.0 lb

## 2018-02-15 DIAGNOSIS — J4521 Mild intermittent asthma with (acute) exacerbation: Secondary | ICD-10-CM

## 2018-02-15 DIAGNOSIS — J309 Allergic rhinitis, unspecified: Secondary | ICD-10-CM | POA: Diagnosis not present

## 2018-02-15 MED ORDER — FLUTICASONE PROPIONATE 50 MCG/ACT NA SUSP: 2.0000 | Freq: Every day | NASAL | 6 refills | Status: DC

## 2018-02-15 MED ORDER — PREDNISONE 20 MG PO TABS: 40.0000 mg | ORAL_TABLET | Freq: Every day | ORAL | 0 refills | Status: AC

## 2018-02-15 MED ORDER — ALBUTEROL SULFATE HFA 108 (90 BASE) MCG/ACT IN AERS: 2.0000 | INHALATION_SPRAY | Freq: Four times a day (QID) | RESPIRATORY_TRACT | 2 refills | Status: DC | PRN

## 2018-02-15 NOTE — Progress Notes (Signed)
Patient: Marcia Livingston Female    DOB: [birth year]   72 y.o.   MRN: [id number] Visit Date: 02/15/2018  Today's Provider: Lavon Paganini, MD   Chief Complaint  Patient presents with  . Cough   Subjective:    Cough  This is a recurrent (states she had these same sx in February and March) problem. Episode onset: x 1 week. The cough is productive of sputum. Associated symptoms include chest pain (rib pain due to cough), headaches, postnasal drip, rhinorrhea, shortness of breath and wheezing. Pertinent negatives include no chills, ear congestion, ear pain, hemoptysis, nasal congestion, sore throat or weight loss. Associated symptoms comments: Highest documented temperature has been 100.1 last night. Treatments tried: Zyrtec, 2 rounds of Doxy, steroid dose pack, tessalon perles, mucinex, coricidin HBP, inhaler. The treatment provided no relief.      Allergies  Allergen Reactions  . Penicillins Rash    Has patient had a PCN reaction causing immediate rash, facial/tongue/throat swelling, SOB or lightheadedness with hypotension: [id number]} Has patient had a PCN reaction causing severe rash involving mucus membranes or skin necrosis: no:[id number]} Has patient had a PCN reaction that required hospitalization no:[id number]} Has patient had a PCN reaction occurring within the last 10 years: no:[id number]} If all of the above answers are "NO", then may proceed with Cephalosporin use.   . Codeine Hives  . Sulfa Antibiotics Hives     Current Outpatient Medications:  .  ALPRAZolam (XANAX) 0.25 MG tablet, Take 0.5 mg by mouth at bedtime as needed for anxiety. , Disp: , Rfl:  .  aspirin EC 81 MG tablet, Take 81 mg by mouth daily., Disp: , Rfl:  .  buPROPion (WELLBUTRIN XL) 150 MG 24 hr tablet, Take 150 mg by mouth daily., Disp: , Rfl:  .  cholecalciferol (VITAMIN D) 1000 units tablet, Take 1,000 Units by mouth daily., Disp: , Rfl:  .  colestipol (COLESTID) 1 g tablet, Take 1 g by mouth daily. ,  Disp: , Rfl:  .  DULoxetine (CYMBALTA) 60 MG capsule, Take by mouth., Disp: , Rfl:  .  hydrochlorothiazide (HYDRODIURIL) 25 MG tablet, Take 50 mg by mouth daily. , Disp: , Rfl:  .  losartan (COZAAR) 50 MG tablet, Take by mouth., Disp: , Rfl:  .  ranitidine (ZANTAC) 150 MG tablet, Take by mouth., Disp: , Rfl:  .  rosuvastatin (CRESTOR) 5 MG tablet, Take 5 mg by mouth 3 (three) times a week., Disp: , Rfl:  .  traZODone (DESYREL) 50 MG tablet, TAKE 1 TABLET BY MOUTH  NIGHTLY, Disp: , Rfl:   Review of Systems  Constitutional: Negative for chills and weight loss.  HENT: Positive for postnasal drip and rhinorrhea. Negative for ear pain and sore throat.   Respiratory: Positive for cough, shortness of breath and wheezing. Negative for hemoptysis.   Cardiovascular: Positive for chest pain (rib pain due to cough).  Neurological: Positive for headaches.    Social History   Tobacco Use  . Smoking status: Former Smoker    Packs/day: 1.00    Years: 30.00    Pack years: 30.00    Types: Cigarettes    Last attempt to quit: 10/15/1996    Years since quitting: 21.3  . Smokeless tobacco: Never Used  Substance Use Topics  . Alcohol use: Yes    Comment: occasional wine, <1 per month   Objective:   BP (!) 144/64 (BP Location: Left Arm, Patient Position: Sitting, Cuff Size: Large)   Pulse 86  Temp 98.1 F (36.7 C) (Oral)   Resp 16   Wt 221 lb (100.2 kg)   SpO2 95%   BMI 41.08 kg/m  Vitals:   02/15/18 1529  BP: (!) 144/64  Pulse: 86  Resp: 16  Temp: 98.1 F (36.7 C)  TempSrc: Oral  SpO2: 95%  Weight: 221 lb (100.2 kg)     Physical Exam  Constitutional: She is oriented to person, place, and time. She appears well-developed and well-nourished. No distress.  HENT:  Head: Normocephalic and atraumatic.  Right Ear: Tympanic membrane, external ear and ear canal normal.  Left Ear: Tympanic membrane, external ear and ear canal normal.  Nose: Mucosal edema present. Right sinus exhibits no  maxillary sinus tenderness and no frontal sinus tenderness. Left sinus exhibits no maxillary sinus tenderness and no frontal sinus tenderness.  Mouth/Throat: Uvula is midline, oropharynx is clear and moist and mucous membranes are normal.  Eyes: Conjunctivae are normal. Right eye exhibits no discharge. Left eye exhibits no discharge. No scleral icterus.  Neck: Neck supple. No thyromegaly present.  Cardiovascular: Normal rate, regular rhythm, normal heart sounds and intact distal pulses.  No murmur heard. Pulmonary/Chest: Effort normal. No respiratory distress. She has wheezes (diffusely on expiration). She has no rales.  Musculoskeletal: She exhibits no edema.  Lymphadenopathy:    She has no cervical adenopathy.  Neurological: She is alert and oriented to person, place, and time.  Skin: Skin is warm and dry. Capillary refill takes less than 2 seconds. No rash noted.  Psychiatric: She has a normal mood and affect. Her behavior is normal.  Vitals reviewed.       Assessment & Plan:   1. 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 -Patient with 3 episodes within the last 3 to 4 months of bronchitis and wheezing -Patient has no history of asthma and she is a non-smoker - She does have significant allergy symptoms, so it would not be unusual for her to also have asthma -Discussed that when she is well and not an acute flare, we should check PFTs to ensure no underlying asthma -Treat current flare with prednisone burst for 7 days and over-the-counter treatments for symptom management -She can continue albuterol inhaler as needed -No current indication for controller inhaler -Could consider Singulair in the future -Return precautions discussed  2. Allergic rhinitis, unspecified seasonality, unspecified trigger -Suspect this is related to her bronchitis episodes and she is uncontrolled at this point - States that Claritin and Zyrtec have not helped her allergy symptoms, so  she can try Allegra - Should also try Flonase daily -Discussed environmental precautions for avoiding allergens   Meds ordered this encounter  Medications  . fluticasone (FLONASE) 50 MCG/ACT nasal spray    Sig: Place 2 sprays into both nostrils daily.    Dispense:  16 g    Refill:  6  . predniSONE (DELTASONE) 20 MG tablet    Sig: Take 2 tablets (40 mg total) by mouth daily with breakfast for 7 days.    Dispense:  14 tablet    Refill:  0  . albuterol (PROVENTIL HFA;VENTOLIN HFA) 108 (90 Base) MCG/ACT inhaler    Sig: Inhale 2 puffs into the lungs every 6 (six) hours as needed for wheezing or shortness of breath.    Dispense:  1 Inhaler    Refill:  2     Return if symptoms worsen or fail to improve.   The entirety of the information documented in the History of Present Illness,  Review of Systems and Physical Exam were personally obtained by me. Portions of this information were initially documented by Raquel Sarna Ratchford, CMA and reviewed by me for thoroughness and accuracy.    Virginia Crews, MD, MPH [facility] 02/15/2018 4:00 PM

## 2018-02-15 NOTE — Patient Instructions (Signed)

## 2018-02-21 DIAGNOSIS — Z961 Presence of intraocular lens: Secondary | ICD-10-CM | POA: Diagnosis not present

## 2018-03-03 DIAGNOSIS — B9689 Other specified bacterial agents as the cause of diseases classified elsewhere: Secondary | ICD-10-CM | POA: Diagnosis not present

## 2018-03-03 DIAGNOSIS — J45901 Unspecified asthma with (acute) exacerbation: Secondary | ICD-10-CM | POA: Diagnosis not present

## 2018-03-03 DIAGNOSIS — J209 Acute bronchitis, unspecified: Secondary | ICD-10-CM | POA: Diagnosis not present

## 2018-03-03 DIAGNOSIS — R05 Cough: Secondary | ICD-10-CM | POA: Diagnosis not present

## 2018-03-03 DIAGNOSIS — J019 Acute sinusitis, unspecified: Secondary | ICD-10-CM | POA: Diagnosis not present

## 2018-04-02 DIAGNOSIS — E782 Mixed hyperlipidemia: Secondary | ICD-10-CM | POA: Diagnosis not present

## 2018-04-02 DIAGNOSIS — I350 Nonrheumatic aortic (valve) stenosis: Secondary | ICD-10-CM | POA: Diagnosis not present

## 2018-04-02 DIAGNOSIS — R609 Edema, unspecified: Secondary | ICD-10-CM | POA: Diagnosis not present

## 2018-04-02 DIAGNOSIS — R079 Chest pain, unspecified: Secondary | ICD-10-CM | POA: Diagnosis not present

## 2018-04-02 DIAGNOSIS — Z9989 Dependence on other enabling machines and devices: Secondary | ICD-10-CM | POA: Diagnosis not present

## 2018-04-02 DIAGNOSIS — I10 Essential (primary) hypertension: Secondary | ICD-10-CM | POA: Diagnosis not present

## 2018-04-02 DIAGNOSIS — G4733 Obstructive sleep apnea (adult) (pediatric): Secondary | ICD-10-CM | POA: Diagnosis not present

## 2018-04-02 DIAGNOSIS — Z9889 Other specified postprocedural states: Secondary | ICD-10-CM | POA: Diagnosis not present

## 2018-04-15 ENCOUNTER — Other Ambulatory Visit: Payer: Self-pay | Admitting: Family Medicine

## 2018-04-15 DIAGNOSIS — Z1231 Encounter for screening mammogram for malignant neoplasm of breast: Secondary | ICD-10-CM

## 2018-05-02 ENCOUNTER — Telehealth: Payer: Self-pay

## 2018-05-02 ENCOUNTER — Ambulatory Visit
Admission: RE | Admit: 2018-05-02 | Discharge: 2018-05-02 | Disposition: A | Discharge status: Home / Self Care | Payer: Medicare Other | Source: Ambulatory Visit | Attending: Family Medicine | Admitting: Family Medicine

## 2018-05-02 DIAGNOSIS — Z1231 Encounter for screening mammogram for malignant neoplasm of breast: Secondary | ICD-10-CM | POA: Insufficient documentation

## 2018-05-02 NOTE — Telephone Encounter (Signed)
Pt advised.

## 2018-05-02 NOTE — Telephone Encounter (Signed)
-----   Message from Virginia Crews, MD sent at 05/02/2018  3:30 PM EDT ----- Normal mammogram. Repeat in 1 yr  Virginia Crews, MD, MPH [facility] 05/02/2018 3:30 PM

## 2018-05-16 ENCOUNTER — Other Ambulatory Visit: Payer: Self-pay | Admitting: Family Medicine

## 2018-05-16 MED ORDER — DULOXETINE HCL 60 MG PO CPEP: 60.0000 mg | ORAL_CAPSULE | Freq: Every day | ORAL | 11 refills | Status: DC

## 2018-05-16 MED ORDER — BUPROPION HCL ER (XL) 150 MG PO TB24: 150.0000 mg | ORAL_TABLET | Freq: Every day | ORAL | 11 refills | Status: DC

## 2018-05-16 NOTE — Telephone Encounter (Addendum)
[facility] pharmacy for the following medications. Thanks CC  DULoxetine (CYMBALTA) 60 MG capsule   buPROPion (WELLBUTRIN XL) 150 MG 24 hr tablet

## 2018-07-04 ENCOUNTER — Encounter: Payer: Self-pay | Admitting: Family Medicine

## 2018-07-04 ENCOUNTER — Ambulatory Visit (INDEPENDENT_AMBULATORY_CARE_PROVIDER_SITE_OTHER): Payer: Medicare Other | Admitting: Family Medicine

## 2018-07-04 VITALS — BP 144/78 | HR 76 | Temp 98.3°F | Wt 218.0 lb

## 2018-07-04 DIAGNOSIS — J301 Allergic rhinitis due to pollen: Secondary | ICD-10-CM

## 2018-07-04 DIAGNOSIS — J0101 Acute recurrent maxillary sinusitis: Secondary | ICD-10-CM

## 2018-07-04 DIAGNOSIS — J309 Allergic rhinitis, unspecified: Secondary | ICD-10-CM | POA: Insufficient documentation

## 2018-07-04 MED ORDER — DOXYCYCLINE HYCLATE 100 MG PO TABS: 100.0000 mg | ORAL_TABLET | Freq: Two times a day (BID) | ORAL | 0 refills | Status: DC

## 2018-07-04 NOTE — Progress Notes (Signed)
Patient: Marcia Livingston Female    DOB: 10-25-1945   72 y.o.   MRN: [id number] Visit Date: 07/04/2018  Today's Provider: Lavon Paganini, MD   Chief Complaint  Patient presents with  . URI   Subjective:    I, Tiburcio Pea, CMA, am acting as a scribe for Lavon Paganini, MD.   URI   This is a new problem. Episode onset: 2 days ago. The problem has been gradually worsening. Maximum temperature: 100.2. Associated symptoms include congestion, coughing, headaches and sinus pain. Joint pain: jaw pain. Associated symptoms comments: Low grade fever . She has tried acetaminophen for the symptoms. The treatment provided mild relief.   Sinus pressure and headache present for ~3 weeks.    Allergies  Allergen Reactions  . Penicillins Rash    Has patient had a PCN reaction causing immediate rash, facial/tongue/throat swelling, SOB or lightheadedness with hypotension: [id number]} Has patient had a PCN reaction causing severe rash involving mucus membranes or skin necrosis: no:[id number]} Has patient had a PCN reaction that required hospitalization no:[id number]} Has patient had a PCN reaction occurring within the last 10 years: no:[id number]} If all of the above answers are "NO", then may proceed with Cephalosporin use.   . Codeine Hives  . Sulfa Antibiotics Hives     Current Outpatient Medications:  .  albuterol (PROVENTIL HFA;VENTOLIN HFA) 108 (90 Base) MCG/ACT inhaler, Inhale 2 puffs into the lungs every 6 (six) hours as needed for wheezing or shortness of breath., Disp: 1 Inhaler, Rfl: 2 .  ALPRAZolam (XANAX) 0.25 MG tablet, Take 0.5 mg by mouth at bedtime as needed for anxiety. , Disp: , Rfl:  .  aspirin EC 81 MG tablet, Take 81 mg by mouth daily., Disp: , Rfl:  .  buPROPion (WELLBUTRIN XL) 150 MG 24 hr tablet, Take 1 tablet (150 mg total) by mouth daily., Disp: 30 tablet, Rfl: 11 .  cholecalciferol (VITAMIN D) 1000 units tablet, Take 1,000 Units by mouth daily., Disp: , Rfl:  .   colestipol (COLESTID) 1 g tablet, Take 1 g by mouth daily. , Disp: , Rfl:  .  DULoxetine (CYMBALTA) 60 MG capsule, Take 1 capsule (60 mg total) by mouth daily., Disp: 30 capsule, Rfl: 11 .  fluticasone (FLONASE) 50 MCG/ACT nasal spray, Place 2 sprays into both nostrils daily., Disp: 16 g, Rfl: 6 .  Fluticasone-Salmeterol (WIXELA INHUB) 250-50 MCG/DOSE AEPB, Inhale 1 puff into the lungs 2 (two) times daily., Disp: , Rfl:  .  hydrochlorothiazide (HYDRODIURIL) 25 MG tablet, Take 50 mg by mouth daily. , Disp: , Rfl:  .  losartan (COZAAR) 50 MG tablet, Take by mouth., Disp: , Rfl:  .  ranitidine (ZANTAC) 150 MG tablet, Take by mouth., Disp: , Rfl:  .  rosuvastatin (CRESTOR) 5 MG tablet, Take 5 mg by mouth 3 (three) times a week., Disp: , Rfl:  .  traZODone (DESYREL) 50 MG tablet, TAKE 1 TABLET BY MOUTH  NIGHTLY, Disp: , Rfl:   Review of Systems  Constitutional: Negative.   HENT: Positive for congestion and sinus pain.   Respiratory: Positive for cough.   Cardiovascular: Negative.   Musculoskeletal: Negative.  Arthralgias: jaw pain  Joint pain: jaw pain.  Neurological: Positive for headaches.    Social History   Tobacco Use  . Smoking status: Former Smoker    Packs/day: 1.00    Years: 30.00    Pack years: 30.00    Types: Cigarettes    Last attempt to quit:  10/15/1996    Years since quitting: 21.7  . Smokeless tobacco: Never Used  Substance Use Topics  . Alcohol use: Yes    Comment: occasional wine, <1 per month   Objective:   BP (!) 144/78 (BP Location: Right Arm, Patient Position: Sitting, Cuff Size: Large)   Pulse 76   Temp 98.3 F (36.8 C) (Oral)   Wt 218 lb (98.9 kg)   SpO2 96%   BMI 40.52 kg/m  Vitals:   07/04/18 1034  BP: (!) 144/78  Pulse: 76  Temp: 98.3 F (36.8 C)  TempSrc: Oral  SpO2: 96%  Weight: 218 lb (98.9 kg)     Physical Exam  Constitutional: She is oriented to person, place, and time. She appears well-developed and well-nourished. No distress.    HENT:  Head: Normocephalic and atraumatic.  Right Ear: Tympanic membrane, external ear and ear canal normal.  Left Ear: Tympanic membrane, external ear and ear canal normal.  Nose: Mucosal edema present. Right sinus exhibits maxillary sinus tenderness. Right sinus exhibits no frontal sinus tenderness. Left sinus exhibits maxillary sinus tenderness. Left sinus exhibits no frontal sinus tenderness.  Mouth/Throat: Uvula is midline, oropharynx is clear and moist and mucous membranes are normal. No oropharyngeal exudate.  Eyes: Pupils are equal, round, and reactive to light. Conjunctivae are normal. Right eye exhibits no discharge. Left eye exhibits no discharge. No scleral icterus.  Neck: Neck supple. No thyromegaly present.  Cardiovascular: Normal rate, regular rhythm, normal heart sounds and intact distal pulses.  No murmur heard. Pulmonary/Chest: Effort normal and breath sounds normal. No respiratory distress. She has no wheezes. She has no rales.  Musculoskeletal: She exhibits no edema.  Lymphadenopathy:    She has no cervical adenopathy.  Neurological: She is alert and oriented to person, place, and time.  Skin: Skin is warm and dry. Capillary refill takes less than 2 seconds. No rash noted.  Psychiatric: She has a normal mood and affect. Her behavior is normal.  Vitals reviewed.       Assessment & Plan:   1. Acute recurrent maxillary sinusitis -Symptoms and exam consistent with sinusitis -Patient has sinus infections 1-2 times per year -Allergic rhinitis seems to contribute to this - Given duration of symptoms, reasonable to treat as bacterial sinusitis - Treat with doxycycline x10 days -Discussed natural course, symptomatic management, and return precautions  2. Seasonal allergic rhinitis due to pollen -Uncontrolled -Likely contributes to recurrent sinus infections - Continue Allegra and Flonase -Discussed avoiding triggers and allergens    Meds ordered this encounter   Medications  . doxycycline (VIBRA-TABS) 100 MG tablet    Sig: Take 1 tablet (100 mg total) by mouth 2 (two) times daily.    Dispense:  20 tablet    Refill:  0     Return if symptoms worsen or fail to improve.   The entirety of the information documented in the History of Present Illness, Review of Systems and Physical Exam were personally obtained by me. Portions of this information were initially documented by Tiburcio Pea, CMA and reviewed by me for thoroughness and accuracy.    Virginia Crews, MD, MPH [facility] 07/04/2018 10:58 AM

## 2018-07-04 NOTE — Patient Instructions (Signed)

## 2018-07-07 ENCOUNTER — Encounter: Payer: Self-pay | Admitting: Family Medicine

## 2018-07-08 MED ORDER — BUPROPION HCL ER (XL) 150 MG PO TB24
150.0000 mg | ORAL_TABLET | Freq: Every day | ORAL | 3 refills | Status: DC
Start: 2018-07-08 — End: 2019-02-14

## 2018-07-08 MED ORDER — DULOXETINE HCL 60 MG PO CPEP: 60.0000 mg | ORAL_CAPSULE | Freq: Every day | ORAL | 3 refills | Status: DC

## 2018-07-10 DIAGNOSIS — N390 Urinary tract infection, site not specified: Secondary | ICD-10-CM | POA: Diagnosis not present

## 2018-07-10 DIAGNOSIS — R300 Dysuria: Secondary | ICD-10-CM | POA: Diagnosis not present

## 2018-07-18 ENCOUNTER — Encounter: Payer: Medicare Other | Admitting: Family Medicine

## 2018-07-18 ENCOUNTER — Ambulatory Visit: Payer: Medicare Other

## 2018-08-08 DIAGNOSIS — Z23 Encounter for immunization: Secondary | ICD-10-CM | POA: Diagnosis not present

## 2018-08-09 ENCOUNTER — Ambulatory Visit (INDEPENDENT_AMBULATORY_CARE_PROVIDER_SITE_OTHER): Payer: Medicare Other | Admitting: Family Medicine

## 2018-08-09 ENCOUNTER — Ambulatory Visit (INDEPENDENT_AMBULATORY_CARE_PROVIDER_SITE_OTHER): Payer: Medicare Other

## 2018-08-09 ENCOUNTER — Other Ambulatory Visit: Payer: Self-pay | Admitting: Family Medicine

## 2018-08-09 VITALS — BP 148/66 | HR 74 | Temp 97.8°F | Ht 62.0 in | Wt 217.2 lb

## 2018-08-09 DIAGNOSIS — F419 Anxiety disorder, unspecified: Secondary | ICD-10-CM

## 2018-08-09 DIAGNOSIS — G4733 Obstructive sleep apnea (adult) (pediatric): Secondary | ICD-10-CM

## 2018-08-09 DIAGNOSIS — F3341 Major depressive disorder, recurrent, in partial remission: Secondary | ICD-10-CM | POA: Diagnosis not present

## 2018-08-09 DIAGNOSIS — G4700 Insomnia, unspecified: Secondary | ICD-10-CM | POA: Diagnosis not present

## 2018-08-09 DIAGNOSIS — Z0000 Encounter for general adult medical examination without abnormal findings: Secondary | ICD-10-CM | POA: Diagnosis not present

## 2018-08-09 DIAGNOSIS — I10 Essential (primary) hypertension: Secondary | ICD-10-CM | POA: Diagnosis not present

## 2018-08-09 DIAGNOSIS — K219 Gastro-esophageal reflux disease without esophagitis: Secondary | ICD-10-CM

## 2018-08-09 DIAGNOSIS — Z1159 Encounter for screening for other viral diseases: Secondary | ICD-10-CM

## 2018-08-09 DIAGNOSIS — E7800 Pure hypercholesterolemia, unspecified: Secondary | ICD-10-CM | POA: Diagnosis not present

## 2018-08-09 NOTE — Assessment & Plan Note (Signed)
Well controlled Continue ranitidine

## 2018-08-09 NOTE — Assessment & Plan Note (Signed)
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

## 2018-08-09 NOTE — Assessment & Plan Note (Signed)
Well-controlled.  Continue CPAP. 

## 2018-08-09 NOTE — Patient Instructions (Signed)
Marcia Livingston , Thank you for taking time to come for your Medicare Wellness Visit. I appreciate your ongoing commitment to your health goals. Please review the following plan we discussed and let me know if I can assist you in the future.   Screening recommendations/referrals: Colonoscopy: Up to date Mammogram: Up to date Bone Density: Up to date Recommended yearly ophthalmology/optometry visit for glaucoma screening and checkup Recommended yearly dental visit for hygiene and checkup  Vaccinations: Influenza vaccine: Up to date Pneumococcal vaccine: Up to date Tdap vaccine: Up to date Shingles vaccine: Pt declines today.     Advanced directives: Please bring a copy of your POA (Power of Attorney) and/or Living Will to your next appointment.   Conditions/risks identified: Obesity- recommend to start walking 3 days a week for at least 30 minutes at a time.   Next appointment: 11:00 AM today with Dr Brita Romp.   Preventive Care 56 Years and Older, Female Preventive care refers to lifestyle choices and visits with your health care provider that can promote health and wellness. What does preventive care include?  A yearly physical exam. This is also called an annual well check.  Dental exams once or twice a year.  Routine eye exams. Ask your health care provider how often you should have your eyes checked.  Personal lifestyle choices, including:  Daily care of your teeth and gums.  Regular physical activity.  Eating a healthy diet.  Avoiding tobacco and drug use.  Limiting alcohol use.  Practicing safe sex.  Taking low-dose aspirin every day.  Taking vitamin and mineral supplements as recommended by your health care provider. What happens during an annual well check? The services and screenings done by your health care provider during your annual well check will depend on your age, overall health, lifestyle risk factors, and family history of disease. Counseling  Your  health care provider may ask you questions about your:  Alcohol use.  Tobacco use.  Drug use.  Emotional well-being.  Home and relationship well-being.  Sexual activity.  Eating habits.  History of falls.  Memory and ability to understand (cognition).  Work and work Statistician.  Reproductive health. Screening  You may have the following tests or measurements:  Height, weight, and BMI.  Blood pressure.  Lipid and cholesterol levels. These may be checked every 5 years, or more frequently if you are over 11 years old.  Skin check.  Lung cancer screening. You may have this screening every year starting at age 40 if you have a 30-pack-year history of smoking and currently smoke or have quit within the past 15 years.  Fecal occult blood test (FOBT) of the stool. You may have this test every year starting at age 42.  Flexible sigmoidoscopy or colonoscopy. You may have a sigmoidoscopy every 5 years or a colonoscopy every 10 years starting at age 13.  Hepatitis C blood test.  Hepatitis B blood test.  Sexually transmitted disease (STD) testing.  Diabetes screening. This is done by checking your blood sugar (glucose) after you have not eaten for a while (fasting). You may have this done every 1-3 years.  Bone density scan. This is done to screen for osteoporosis. You may have this done starting at age 83.  Mammogram. This may be done every 1-2 years. Talk to your health care provider about how often you should have regular mammograms. Talk with your health care provider about your test results, treatment options, and if necessary, the need for more tests. Vaccines  Your health care provider may recommend certain vaccines, such as:  Influenza vaccine. This is recommended every year.  Tetanus, diphtheria, and acellular pertussis (Tdap, Td) vaccine. You may need a Td booster every 10 years.  Zoster vaccine. You may need this after age 17.  Pneumococcal 13-valent  conjugate (PCV13) vaccine. One dose is recommended after age 14.  Pneumococcal polysaccharide (PPSV23) vaccine. One dose is recommended after age 55. Talk to your health care provider about which screenings and vaccines you need and how often you need them. This information is not intended to replace advice given to you by your health care provider. Make sure you discuss any questions you have with your health care provider. Document Released: 10/29/2015 Document Revised: 06/21/2016 Document Reviewed: 08/03/2015 Elsevier Interactive Patient Education  2017 [place] Prevention in the Home Falls can cause injuries. They can happen to people of all ages. There are many things you can do to make your home safe and to help prevent falls. What can I do on the outside of my home?  Regularly fix the edges of walkways and driveways and fix any cracks.  Remove anything that might make you trip as you walk through a door, such as a raised step or threshold.  Trim any bushes or trees on the path to your home.  Use bright outdoor lighting.  Clear any walking paths of anything that might make someone trip, such as rocks or tools.  Regularly check to see if handrails are loose or broken. Make sure that both sides of any steps have handrails.  Any raised decks and porches should have guardrails on the edges.  Have any leaves, snow, or ice cleared regularly.  Use sand or salt on walking paths during winter.  Clean up any spills in your garage right away. This includes oil or grease spills. What can I do in the bathroom?  Use night lights.  Install grab bars by the toilet and in the tub and shower. Do not use towel bars as grab bars.  Use non-skid mats or decals in the tub or shower.  If you need to sit down in the shower, use a plastic, non-slip stool.  Keep the floor dry. Clean up any water that spills on the floor as soon as it happens.  Remove soap buildup in the tub or  shower regularly.  Attach bath mats securely with double-sided non-slip rug tape.  Do not have throw rugs and other things on the floor that can make you trip. What can I do in the bedroom?  Use night lights.  Make sure that you have a light by your bed that is easy to reach.  Do not use any sheets or blankets that are too big for your bed. They should not hang down onto the floor.  Have a firm chair that has side arms. You can use this for support while you get dressed.  Do not have throw rugs and other things on the floor that can make you trip. What can I do in the kitchen?  Clean up any spills right away.  Avoid walking on wet floors.  Keep items that you use a lot in easy-to-reach places.  If you need to reach something above you, use a strong step stool that has a grab bar.  Keep electrical cords out of the way.  Do not use floor polish or wax that makes floors slippery. If you must use wax, use non-skid floor wax.  Do  not have throw rugs and other things on the floor that can make you trip. What can I do with my stairs?  Do not leave any items on the stairs.  Make sure that there are handrails on both sides of the stairs and use them. Fix handrails that are broken or loose. Make sure that handrails are as long as the stairways.  Check any carpeting to make sure that it is firmly attached to the stairs. Fix any carpet that is loose or worn.  Avoid having throw rugs at the top or bottom of the stairs. If you do have throw rugs, attach them to the floor with carpet tape.  Make sure that you have a light switch at the top of the stairs and the bottom of the stairs. If you do not have them, ask someone to add them for you. What else can I do to help prevent falls?  Wear shoes that:  Do not have high heels.  Have rubber bottoms.  Are comfortable and fit you well.  Are closed at the toe. Do not wear sandals.  If you use a stepladder:  Make sure that it is fully  opened. Do not climb a closed stepladder.  Make sure that both sides of the stepladder are locked into place.  Ask someone to hold it for you, if possible.  Clearly mark and make sure that you can see:  Any grab bars or handrails.  First and last steps.  Where the edge of each step is.  Use tools that help you move around (mobility aids) if they are needed. These include:  Canes.  Walkers.  Scooters.  Crutches.  Turn on the lights when you go into a dark area. Replace any light bulbs as soon as they burn out.  Set up your furniture so you have a clear path. Avoid moving your furniture around.  If any of your floors are uneven, fix them.  If there are any pets around you, be aware of where they are.  Review your medicines with your doctor. Some medicines can make you feel dizzy. This can increase your chance of falling. Ask your doctor what other things that you can do to help prevent falls. This information is not intended to replace advice given to you by your health care provider. Make sure you discuss any questions you have with your health care provider. Document Released: 07/29/2009 Document Revised: 03/09/2016 Document Reviewed: 11/06/2014 Elsevier Interactive Patient Education  2017 [employer].

## 2018-08-09 NOTE — Progress Notes (Signed)
Subjective:   Marcia Livingston is a 72 y.o. female who presents for Medicare Annual (Subsequent) preventive examination.  Review of Systems:  N/A  Cardiac Risk Factors include: advanced age (>25men, >41 women);hypertension;obesity (BMI >30kg/m2)     Objective:     Vitals: BP (!) 148/66 (BP Location: Right Arm)   Pulse 74   Temp 97.8 F (36.6 C) (Oral)   Ht 5\' 2"  (1.575 m)   Wt 217 lb 3.2 oz (98.5 kg)   BMI 39.73 kg/m   Body mass index is 39.73 kg/m.  Advanced Directives 08/09/2018 05/19/2017 02/01/2016 10/23/2015 10/22/2015  Does Patient Have a Medical Advance Directive? Yes Yes Yes Yes Yes  Type of Paramedic of [place];Living will Healthcare Power of [place];Living will Healthcare Power of [place]  Does patient want to make changes to medical advance directive? - Yes (Inpatient - patient defers changing a medical advance directive at this time) No - Patient declined - -  Copy of [place] in Chart? No - copy requested No - copy requested No - copy requested No - copy requested No - copy requested  Would patient like information on creating a medical advance directive? - No - Patient declined No - patient declined information No - patient declined information Yes - Educational materials given    Tobacco Social History   Tobacco Use  Smoking Status Former Smoker  . Packs/day: 1.00  . Years: 30.00  . Pack years: 30.00  . Types: Cigarettes  . Last attempt to quit: 10/15/1996  . Years since quitting: 21.8  Smokeless Tobacco Never Used     Counseling given: Not Answered   Clinical Intake:  Pre-visit preparation completed: Yes  Pain : No/denies pain Pain Score: 0-No pain     Nutritional Status: BMI > 30  Obese Nutritional Risks: Non-healing wound(blisters in mouth ) Diabetes: No  How often do you need to have someone help you when you read instructions, pamphlets, or  other written materials from your doctor or pharmacy?: 1 - Never  Interpreter Needed?: No  Information entered by :: [facility], LPN  Past Medical History:  Diagnosis Date  . Anxiety   . Arthritis   . CAD (coronary artery disease)   . Cancer ([place])    SKIN  . Colon polyps   . Depression   . Depression   . Fibrocystic breast disease   . GERD (gastroesophageal reflux disease)   . Heart murmur   . High cholesterol   . Hyperlipidemia   . Hypertension   . IBS (irritable bowel syndrome)   . IBS (irritable bowel syndrome)   . Obesity   . Osteoporosis   . Overactive bladder   . Sleep apnea   . Sleep apnea    Past Surgical History:  Procedure Laterality Date  . ABDOMINAL HYSTERECTOMY     total  . APPENDECTOMY    . BREAST EXCISIONAL BIOPSY Right YRS AGO   NEG  . CATARACT EXTRACTION W/PHACO Right 02/01/2016   Procedure: CATARACT EXTRACTION PHACO AND INTRAOCULAR LENS PLACEMENT (IOC);  Surgeon: Birder Robson, MD;  Location: [facility] ORS;  Service: Ophthalmology;  Laterality: Right;  [place]    00:37.2 AP%  20.7% CDE  7.69 fluid pack lot # [id number]  . CATARACT EXTRACTION W/PHACO Left 02/22/2016   Procedure: CATARACT EXTRACTION PHACO AND INTRAOCULAR LENS PLACEMENT (IOC);  Surgeon: Birder Robson, MD;  Location: [facility] ORS;  Service: Ophthalmology;  Laterality: Left;  [place] 00:40 AP% 19.9 CDE 8.06 fluid pack lot # [id number]  . CHOLECYSTECTOMY    . COLONOSCOPY WITH PROPOFOL N/A 01/13/2016   Procedure: COLONOSCOPY WITH PROPOFOL;  Surgeon: Manya Silvas, MD;  Location: [facility] ENDOSCOPY;  Service: Endoscopy;  Laterality: N/A;  . FOOT SURGERY Left    for breakdown of arch  . OVARIAN CYST SURGERY    . SKIN CANCER EXCISION Right 05/16/2017   Right [place]   Family History  Problem Relation Age of Onset  . Colon cancer Mother        thought to be mets from breast cancer  . Stroke Mother   . Hypertension Mother   . Heart attack Mother   . Breast cancer Mother   . Emphysema Father   . Prostate  cancer Father   . Throat cancer Daughter   . Breast cancer Maternal Aunt   . Rheum arthritis Daughter   . Hypertension Daughter   . Reye's syndrome Daughter        history of  . Hepatitis C Daughter   . Ovarian cancer Neg Hx    Social History   Socioeconomic History  . Marital status: Married    Spouse name: Orpah Cobb. Battisti  . Number of children: 2  . Years of education: 36  . Highest education level: High school graduate  Occupational History  . Occupation: Retired    Comment: book-keeper  Social Needs  . Financial resource strain: Not hard at all  . Food insecurity:    Worry: Never true    Inability: Never true  . Transportation needs:    Medical: No    Non-medical: No  Tobacco Use  . Smoking status: Former Smoker    Packs/day: 1.00    Years: 30.00    Pack years: 30.00    Types: Cigarettes    Last attempt to quit: 10/15/1996    Years since quitting: 21.8  . Smokeless tobacco: Never Used  Substance and Sexual Activity  . Alcohol use: Not Currently  . Drug use: No  . Sexual activity: Not Currently    Partners: Male    Birth control/protection: Surgical  Lifestyle  . Physical activity:    Days per week: 0 days    Minutes per session: 0 min  . Stress: Not at all  Relationships  . Social connections:    Talks on phone: Patient refused    Gets together: Patient refused    Attends religious service: Patient refused    Active member of club or organization: Patient refused    Attends meetings of clubs or organizations: Patient refused    Relationship status: Patient refused  Other Topics Concern  . Not on file  Social History Narrative   Pt states she has raised her granddaughter, and considers the granddaughter her child.    Outpatient Encounter Medications as of 08/09/2018  Medication Sig  . albuterol (PROVENTIL HFA;VENTOLIN HFA) 108 (90 Base) MCG/ACT inhaler Inhale 2 puffs into the lungs every 6 (six) hours as needed for wheezing or shortness of breath.  .  ALPRAZolam (XANAX) 0.25 MG tablet Take 0.5 mg by mouth at bedtime as needed for anxiety.   Marland Kitchen buPROPion (WELLBUTRIN XL) 150 MG 24 hr tablet Take 1 tablet (150 mg total) by mouth daily.  . cholecalciferol (VITAMIN D) 1000 units tablet Take 1,000 Units by mouth daily.  . colestipol (COLESTID) 1 g tablet Take 1 g by mouth daily.   . DULoxetine (CYMBALTA) 60 MG capsule Take 1 capsule (  60 mg total) by mouth daily.  . fluticasone (FLONASE) 50 MCG/ACT nasal spray Place 2 sprays into both nostrils daily. (Patient taking differently: Place 2 sprays into both nostrils daily. )  . hydrochlorothiazide (HYDRODIURIL) 25 MG tablet Take 50 mg by mouth daily.   Marland Kitchen losartan (COZAAR) 50 MG tablet Take 50 mg by mouth daily.   . ranitidine (ZANTAC) 150 MG tablet Take 150 mg by mouth at bedtime.   . rosuvastatin (CRESTOR) 5 MG tablet Take 5 mg by mouth 3 (three) times a week.  . traZODone (DESYREL) 50 MG tablet TAKE 1 TABLET BY MOUTH  NIGHTLY  . aspirin EC 81 MG tablet Take 81 mg by mouth daily.  Marland Kitchen doxycycline (VIBRA-TABS) 100 MG tablet Take 1 tablet (100 mg total) by mouth 2 (two) times daily. (Patient not taking: Reported on 08/09/2018)  . Fluticasone-Salmeterol (WIXELA INHUB) 250-50 MCG/DOSE AEPB Inhale 1 puff into the lungs 2 (two) times daily.   No facility-administered encounter medications on file as of 08/09/2018.     Activities of Daily Living In your present state of health, do you have any difficulty performing the following activities: 08/09/2018 01/16/2018  Hearing? N N  Vision? N N  Comment Wears eye glasses.  -  Difficulty concentrating or making decisions? N Y  Walking or climbing stairs? N N  Dressing or bathing? N N  Doing errands, shopping? N N  Preparing Food and eating ? N -  Using the Toilet? N -  In the past six months, have you accidently leaked urine? N -  Do you have problems with loss of bowel control? N -  Managing your Medications? N -  Managing your Finances? N -  Housekeeping or  managing your Housekeeping? N -  Some recent data might be hidden    Patient Care Team: Virginia Crews, MD as PCP - General (Family Medicine) Birder Robson, MD as Referring Physician (Ophthalmology) Isaias Cowman, MD as Consulting Physician (Cardiology)    Assessment:   This is a routine wellness examination for Marcia Livingston.  Exercise Activities and Dietary recommendations Current Exercise Habits: The patient does not participate in regular exercise at present, Exercise limited by: orthopedic condition(s)  Goals    . Exercise 3x per week (30 min per time)     Recommend to start walking 3 days a week for at least 30 minutes at a time.        Fall Risk Fall Risk  08/09/2018 01/16/2018  Falls in the past year? No No   FALL RISK PREVENTION PERTAINING TO THE HOME:  Any stairs in or around the home WITH handrails? No  Home free of loose throw rugs in walkways, pet beds, electrical cords, etc? Yes  Adequate lighting in your home to reduce risk of falls? Yes   ASSISTIVE DEVICES UTILIZED TO PREVENT FALLS:  Life alert? No  Use of a cane, walker or w/c? No  Grab bars in the bathroom? No  Shower chair or bench in shower? Yes  Elevated toilet seat or a handicapped toilet? No    TIMED UP AND GO:  Was the test performed? No .     Depression Screen PHQ 2/9 Scores 08/09/2018 01/16/2018  PHQ - 2 Score 1 1  PHQ- 9 Score - 2     Cognitive Function: Declined today.         Immunization History  Administered Date(s) Administered  . Influenza, High Dose Seasonal PF 07/26/2017  . Influenza,inj,Quad PF,6+ Mos 07/21/2015  . Influenza-Unspecified  10/24/2012, 08/14/2013, 08/20/2014, 07/21/2015, 07/20/2016, 07/30/2017  . Pneumococcal Conjugate-13 07/21/2015  . Pneumococcal Polysaccharide-23 11/26/2009, 01/30/2017  . Tdap 01/30/2017  . Zoster 10/21/2013    Qualifies for Shingles Vaccine? Yes  Zostavax completed 10/21/13. Due for Shingrix. Education has been provided  regarding the importance of this vaccine. Pt has been advised to call insurance company to determine out of pocket expense. Advised may also receive vaccine at local pharmacy or Health Dept. Verbalized acceptance and understanding.  Tdap: Up to date  Flu Vaccine: Up to date  Pneumococcal Vaccine: Up to date   Screening Tests Health Maintenance  Topic Date Due  . Hepatitis C Screening  11-05-1945  . MAMMOGRAM  05/02/2020  . COLONOSCOPY  01/12/2021  . DEXA SCAN  02/28/2022  . TETANUS/TDAP  01/31/2027  . INFLUENZA VACCINE  Completed  . PNA vac Low Risk Adult  Completed   Cancer Screenings:  Colorectal Screening: Completed 01/13/16. Repeat every 5 years.  Mammogram: Completed 05/02/18.   Bone Density: Completed 02/28/17.   Lung Cancer Screening: (Low Dose CT Chest recommended if Age 58-80 years, 30 pack-year currently smoking OR have quit w/in 15years.) does not qualify.    Additional Screening:  Hepatitis C Screening: does qualify; ordered today.  Vision Screening: Recommended annual ophthalmology exams for early detection of glaucoma and other disorders of the eye.  Dental Screening: Recommended annual dental exams for proper oral hygiene  Community Resource Referral:  CRR required this visit?  No       Plan:  I have personally reviewed and addressed the Medicare Annual Wellness questionnaire and have noted the following in the patient's chart:  A. Medical and social history B. Use of alcohol, tobacco or illicit drugs  C. Current medications and supplements D. Functional ability and status E.  Nutritional status F.  Physical activity G. Advance directives H. List of other physicians I.  Hospitalizations, surgeries, and ER visits in previous 12 months J.  [place] such as hearing and vision if needed, cognitive and depression L. Referrals and appointments - none  In addition, I have reviewed and discussed with patient certain preventive protocols,  quality metrics, and best practice recommendations. A written personalized care plan for preventive services as well as general preventive health recommendations were provided to patient.  See attached scanned questionnaire for additional information.   Signed,  Fabio Neighbors, LPN Nurse Health Advisor   Nurse Recommendations: Please recheck BP.

## 2018-08-09 NOTE — Progress Notes (Signed)
Patient: Marcia Livingston Female    DOB: [birth year]   72 y.o.   MRN: [id number] Visit Date: 08/09/2018  Today's Provider: Lavon Paganini, MD   Chief Complaint  Patient presents with  . Hypertension  . Hyperlipidemia  . Anxiety  . Depression   Subjective:     Patient had a AWE with [facility] today prior to appointment.  HPI  Hypertension, follow-up:  BP Readings from Last 3 Encounters:  08/09/18 (!) 148/66  08/09/18 (!) 148/66  07/04/18 (!) 144/78    She was last seen for hypertension 6 months ago.  BP at that visit was 154/72. Management since that visit includes no changes.She reports good compliance with treatment. She is not having side effects.  She is not exercising. She is adherent to low salt diet.   Outside blood pressures are n/a. Patient not checking. She is experiencing none.  Patient denies chest pain, chest pressure/discomfort, claudication, dyspnea, exertional chest pressure/discomfort, fatigue, irregular heart beat, lower extremity edema, near-syncope, orthopnea, palpitations, paroxysmal nocturnal dyspnea, syncope and tachypnea.   Cardiovascular risk factors include advanced age (older than 29 for men, 74 for women), dyslipidemia and hypertension.  Use of agents associated with hypertension: none.   ------------------------------------------------------------------------   Lipid/Cholesterol, Follow-up:   Last seen for this 6 months ago.  Management since that visit includes no changes.  Last Lipid Panel: No results found for: CHOL, TRIG, HDL, CHOLHDL, VLDL, LDLCALC, LDLDIRECT  She reports good compliance with treatment. She is not having side effects.   Wt Readings from Last 3 Encounters:  08/09/18 217 lb 3.2 oz (98.5 kg)  08/09/18 217 lb 3.2 oz (98.5 kg)  07/04/18 218 lb (98.9 kg)    ------------------------------------------------------------------------ Anxiety & Depression:  Patient was last seen for this 6 months ago. Management since  then includes continuing current medications. Patient reports good compliance with treatment plan. She is not having side effects. She states symptoms are fairly well controlled, but she does have some times that are irritable.  Has Xanax around, but doesn't need to take it often at all. Thinks that nerves are better. Having baby around helps with mood   Mouth has felt really raw with some blisters on tongue recently.  She has had other URI symptoms as well.  She has tried cutting out orange juice and a new toothpaste that she was trying.  She has had some loss of taste as well.  Using CPAP machine with good compliance Taking trazodone for sleep which helps    Allergies  Allergen Reactions  . Penicillins Rash    Has patient had a PCN reaction causing immediate rash, facial/tongue/throat swelling, SOB or lightheadedness with hypotension: NWG:[id number]} Has patient had a PCN reaction causing severe rash involving mucus membranes or skin necrosis: no:[id number]} Has patient had a PCN reaction that required hospitalization no:[id number]} Has patient had a PCN reaction occurring within the last 10 years: no:[id number]} If all of the above answers are "NO", then may proceed with Cephalosporin use.   . Codeine Hives  . Sulfa Antibiotics Hives     Current Outpatient Medications:  .  albuterol (PROVENTIL HFA;VENTOLIN HFA) 108 (90 Base) MCG/ACT inhaler, Inhale 2 puffs into the lungs every 6 (six) hours as needed for wheezing or shortness of breath., Disp: 1 Inhaler, Rfl: 2 .  ALPRAZolam (XANAX) 0.25 MG tablet, Take 0.5 mg by mouth at bedtime as needed for anxiety. , Disp: , Rfl:  .  aspirin EC 81 MG tablet, Take 81 mg by  mouth daily., Disp: , Rfl:  .  buPROPion (WELLBUTRIN XL) 150 MG 24 hr tablet, Take 1 tablet (150 mg total) by mouth daily., Disp: 90 tablet, Rfl: 3 .  cholecalciferol (VITAMIN D) 1000 units tablet, Take 1,000 Units by mouth daily., Disp: , Rfl:  .  colestipol (COLESTID) 1 g tablet, Take  1 g by mouth daily. , Disp: , Rfl:  .  DULoxetine (CYMBALTA) 60 MG capsule, Take 1 capsule (60 mg total) by mouth daily., Disp: 90 capsule, Rfl: 3 .  fluticasone (FLONASE) 50 MCG/ACT nasal spray, Place 2 sprays into both nostrils daily. (Patient taking differently: Place 2 sprays into both nostrils daily. ), Disp: 16 g, Rfl: 6 .  Fluticasone-Salmeterol (WIXELA INHUB) 250-50 MCG/DOSE AEPB, Inhale 1 puff into the lungs 2 (two) times daily., Disp: , Rfl:  .  hydrochlorothiazide (HYDRODIURIL) 25 MG tablet, Take 50 mg by mouth daily. , Disp: , Rfl:  .  losartan (COZAAR) 50 MG tablet, Take 50 mg by mouth daily. , Disp: , Rfl:  .  ranitidine (ZANTAC) 150 MG tablet, Take 150 mg by mouth at bedtime. , Disp: , Rfl:  .  rosuvastatin (CRESTOR) 5 MG tablet, Take 5 mg by mouth 3 (three) times a week., Disp: , Rfl:  .  traZODone (DESYREL) 50 MG tablet, TAKE 1 TABLET BY MOUTH  NIGHTLY, Disp: , Rfl:   Review of Systems  Constitutional: Positive for diaphoresis, fatigue and unexpected weight change.  HENT: Positive for mouth sores and sinus pressure.   Eyes: Positive for redness and itching.  Respiratory: Positive for shortness of breath.   Cardiovascular: Negative.   Gastrointestinal: Positive for diarrhea and rectal pain.  Endocrine: Positive for polydipsia and polyuria.  Genitourinary: Negative.   Musculoskeletal: Positive for back pain and neck stiffness.    Social History   Tobacco Use  . Smoking status: Former Smoker    Packs/day: 1.00    Years: 30.00    Pack years: 30.00    Types: Cigarettes    Last attempt to quit: 10/15/1996    Years since quitting: 21.8  . Smokeless tobacco: Never Used  Substance Use Topics  . Alcohol use: Not Currently   Objective:   BP (!) 148/66 (BP Location: Right Arm, Patient Position: Sitting, Cuff Size: Large)   Pulse 74   Temp 97.8 F (36.6 C) (Oral)   Ht 5\' 2"  (1.575 m)   Wt 217 lb 3.2 oz (98.5 kg)   BMI 39.73 kg/m  Vitals:   08/09/18 1032  BP: (!)  148/66  Pulse: 74  Temp: 97.8 F (36.6 C)  TempSrc: Oral  Weight: 217 lb 3.2 oz (98.5 kg)  Height: 5\' 2"  (1.575 m)     Physical Exam  Constitutional: She is oriented to person, place, and time. She appears well-developed and well-nourished. No distress.  HENT:  Head: Normocephalic and atraumatic.  Right Ear: External ear normal.  Left Ear: External ear normal.  Nose: Nose normal.  Mouth/Throat: Oropharynx is clear and moist.  Eyes: Pupils are equal, round, and reactive to light. Conjunctivae and EOM are normal. No scleral icterus.  Neck: Neck supple. No thyromegaly present.  Cardiovascular: Normal rate, regular rhythm, normal heart sounds and intact distal pulses.  No murmur heard. Pulmonary/Chest: Effort normal and breath sounds normal. No respiratory distress. She has no wheezes. She has no rales.  Abdominal: Soft. Bowel sounds are normal. She exhibits no distension. There is no tenderness. There is no rebound and no guarding.  Musculoskeletal: She exhibits  no edema or deformity.  Lymphadenopathy:    She has no cervical adenopathy.  Neurological: She is alert and oriented to person, place, and time.  Skin: Skin is warm and dry. Capillary refill takes less than 2 seconds. No rash noted.  Psychiatric: She has a normal mood and affect. Her behavior is normal.  Vitals reviewed.       Assessment & Plan:   Problem List Items Addressed This Visit      Cardiovascular and Mediastinum   HTN (hypertension) - Primary    Uncontrolled currently, but previously well controlled Seems to have some component of white coat hypertension Will continue current medications She is also followed by Cardiology      Relevant Orders   Comprehensive metabolic panel     Respiratory   OSA (obstructive sleep apnea)    Well controlled Continue CPAP        Digestive   GERD (gastroesophageal reflux disease)    Well controlled Continue ranitidine        Other   High cholesterol     Well controlled on last lipid panel Continue Crestor 3 times weekly Recheck FLP and CMP      Relevant Orders   Lipid panel   Comprehensive metabolic panel   Anxiety    Fairly well controlled Continue current medications We have discussed the dangers and addictive nature of Xanax especially in elderly patients many times in the past We discussed that we will not plan to refill his medication Also discussed the importance of therapy in addition to medications in the past      Depression    Fairly well controlled Continue Cymbalta and Wellbutrin to current doses As above, have discussed the importance of therapy in conjunction with medications      Insomnia    Well-controlled Continue trazodone          Return in about 6 months (around 02/08/2019) for chronic disease f/u.   The entirety of the information documented in the History of Present Illness, Review of Systems and Physical Exam were personally obtained by me. Portions of this information were initially documented by Joseline Rosas and Tiburcio Pea, [place] and reviewed by me for thoroughness and accuracy.    Virginia Crews, MD, MPH [facility] 08/09/2018 4:13 PM

## 2018-08-09 NOTE — Assessment & Plan Note (Addendum)
Fairly well controlled Continue current medications We have discussed the dangers and addictive nature of Xanax especially in elderly patients many times in the past We discussed that we will not plan to refill his medication Also discussed the importance of therapy in addition to medications in the past

## 2018-08-09 NOTE — Assessment & Plan Note (Signed)
Well controlled Continue trazodone 

## 2018-08-09 NOTE — Assessment & Plan Note (Signed)
Fairly well controlled Continue Cymbalta and Wellbutrin to current doses As above, have discussed the importance of therapy in conjunction with medications

## 2018-08-09 NOTE — Patient Instructions (Signed)
 The CDC recommends two doses of Shingrix (the shingles vaccine) separated by 2 to 6 months for adults age 73 years and older. I recommend checking with your insurance plan regarding coverage for this vaccine.     Preventive Care 65 Years and Older, Female Preventive care refers to lifestyle choices and visits with your health care provider that can promote health and wellness. What does preventive care include?  A yearly physical exam. This is also called an annual well check.  Dental exams once or twice a year.  Routine eye exams. Ask your health care provider how often you should have your eyes checked.  Personal lifestyle choices, including: ? Daily care of your teeth and gums. ? Regular physical activity. ? Eating a healthy diet. ? Avoiding tobacco and drug use. ? Limiting alcohol use. ? Practicing safe sex. ? Taking low-dose aspirin every day. ? Taking vitamin and mineral supplements as recommended by your health care provider. What happens during an annual well check? The services and screenings done by your health care provider during your annual well check will depend on your age, overall health, lifestyle risk factors, and family history of disease. Counseling Your health care provider may ask you questions about your:  Alcohol use.  Tobacco use.  Drug use.  Emotional well-being.  Home and relationship well-being.  Sexual activity.  Eating habits.  History of falls.  Memory and ability to understand (cognition).  Work and work environment.  Reproductive health.  Screening You may have the following tests or measurements:  Height, weight, and BMI.  Blood pressure.  Lipid and cholesterol levels. These may be checked every 5 years, or more frequently if you are over 73 years old.  Skin check.  Lung cancer screening. You may have this screening every year starting at age 55 if you have a 30-pack-year history of smoking and currently smoke or have  quit within the past 15 years.  Fecal occult blood test (FOBT) of the stool. You may have this test every year starting at age 73.  Flexible sigmoidoscopy or colonoscopy. You may have a sigmoidoscopy every 5 years or a colonoscopy every 10 years starting at age 73.  Hepatitis C blood test.  Hepatitis B blood test.  Sexually transmitted disease (STD) testing.  Diabetes screening. This is done by checking your blood sugar (glucose) after you have not eaten for a while (fasting). You may have this done every 1-3 years.  Bone density scan. This is done to screen for osteoporosis. You may have this done starting at age 65.  Mammogram. This may be done every 1-2 years. Talk to your health care provider about how often you should have regular mammograms.  Talk with your health care provider about your test results, treatment options, and if necessary, the need for more tests. Vaccines Your health care provider may recommend certain vaccines, such as:  Influenza vaccine. This is recommended every year.  Tetanus, diphtheria, and acellular pertussis (Tdap, Td) vaccine. You may need a Td booster every 10 years.  Varicella vaccine. You may need this if you have not been vaccinated.  Zoster vaccine. You may need this after age 60.  Measles, mumps, and rubella (MMR) vaccine. You may need at least one dose of MMR if you were born in 1957 or later. You may also need a second dose.  Pneumococcal 13-valent conjugate (PCV13) vaccine. One dose is recommended after age 65.  Pneumococcal polysaccharide (PPSV23) vaccine. One dose is recommended after age 65.    Meningococcal vaccine. You may need this if you have certain conditions.  Hepatitis A vaccine. You may need this if you have certain conditions or if you travel or work in places where you may be exposed to hepatitis A.  Hepatitis B vaccine. You may need this if you have certain conditions or if you travel or work in places where you may be  exposed to hepatitis B.  Haemophilus influenzae type b (Hib) vaccine. You may need this if you have certain conditions.  Talk to your health care provider about which screenings and vaccines you need and how often you need them. This information is not intended to replace advice given to you by your health care provider. Make sure you discuss any questions you have with your health care provider. Document Released: 10/29/2015 Document Revised: 06/21/2016 Document Reviewed: 08/03/2015 Elsevier Interactive Patient Education  [employer].

## 2018-08-09 NOTE — Assessment & Plan Note (Signed)
Well controlled on last lipid panel Continue Crestor 3 times weekly Recheck FLP and CMP

## 2018-08-10 LAB — COMPREHENSIVE METABOLIC PANEL
ALBUMIN: 4.3 g/dL (ref 3.5–4.8)
ALK PHOS: 80 IU/L (ref 39–117)
ALT: 17 IU/L (ref 0–32)
AST: 17 IU/L (ref 0–40)
Albumin/Globulin Ratio: 1.7 (ref 1.2–2.2)
BUN / CREAT RATIO: 12 (ref 12–28)
BUN: 11 mg/dL (ref 8–27)
Bilirubin Total: 0.4 mg/dL (ref 0.0–1.2)
CALCIUM: 9.3 mg/dL (ref 8.7–10.3)
CO2: 24 mmol/L (ref 20–29)
CREATININE: 0.89 mg/dL (ref 0.57–1.00)
Chloride: 101 mmol/L (ref 96–106)
GFR calc Af Amer: 74 mL/min/{1.73_m2} (ref >59)
GFR, EST NON AFRICAN AMERICAN: 65 mL/min/{1.73_m2} (ref >59)
GLUCOSE: 91 mg/dL (ref 65–99)
Globulin, Total: 2.5 g/dL (ref 1.5–4.5)
Potassium: 3.6 mmol/L (ref 3.5–5.2)
Sodium: 143 mmol/L (ref 134–144)
TOTAL PROTEIN: 6.8 g/dL (ref 6.0–8.5)

## 2018-08-10 LAB — LIPID PANEL
CHOLESTEROL TOTAL: 151 mg/dL (ref 100–199)
Chol/HDL Ratio: 4.2 ratio (ref 0.0–4.4)
HDL: 36 mg/dL — ABNORMAL LOW (ref >39)
LDL CALC: 81 mg/dL (ref 0–99)
Triglycerides: 170 mg/dL — ABNORMAL HIGH (ref 0–149)
VLDL CHOLESTEROL CAL: 34 mg/dL (ref 5–40)

## 2018-08-10 LAB — HEPATITIS C ANTIBODY: Hep C Virus Ab: <0.1 s/co ratio (ref 0.0–0.9)

## 2018-10-03 DIAGNOSIS — R609 Edema, unspecified: Secondary | ICD-10-CM | POA: Diagnosis not present

## 2018-10-03 DIAGNOSIS — Z9989 Dependence on other enabling machines and devices: Secondary | ICD-10-CM | POA: Diagnosis not present

## 2018-10-03 DIAGNOSIS — R079 Chest pain, unspecified: Secondary | ICD-10-CM | POA: Diagnosis not present

## 2018-10-03 DIAGNOSIS — E782 Mixed hyperlipidemia: Secondary | ICD-10-CM | POA: Diagnosis not present

## 2018-10-03 DIAGNOSIS — Z9889 Other specified postprocedural states: Secondary | ICD-10-CM | POA: Diagnosis not present

## 2018-10-03 DIAGNOSIS — I350 Nonrheumatic aortic (valve) stenosis: Secondary | ICD-10-CM | POA: Diagnosis not present

## 2018-10-03 DIAGNOSIS — I10 Essential (primary) hypertension: Secondary | ICD-10-CM | POA: Diagnosis not present

## 2018-10-03 DIAGNOSIS — R0609 Other forms of dyspnea: Secondary | ICD-10-CM | POA: Diagnosis not present

## 2018-10-03 DIAGNOSIS — G4733 Obstructive sleep apnea (adult) (pediatric): Secondary | ICD-10-CM | POA: Diagnosis not present

## 2018-11-28 ENCOUNTER — Ambulatory Visit: Payer: Medicare Other | Admitting: Family Medicine

## 2019-01-08 ENCOUNTER — Telehealth: Payer: Medicare Other | Admitting: Nurse Practitioner

## 2019-01-08 DIAGNOSIS — N3000 Acute cystitis without hematuria: Secondary | ICD-10-CM | POA: Diagnosis not present

## 2019-01-08 MED ORDER — CEPHALEXIN 500 MG PO CAPS: 500.0000 mg | ORAL_CAPSULE | Freq: Two times a day (BID) | ORAL | 0 refills | Status: DC

## 2019-01-08 NOTE — Progress Notes (Signed)

## 2019-01-25 ENCOUNTER — Telehealth: Payer: Medicare Other | Admitting: Nurse Practitioner

## 2019-01-25 DIAGNOSIS — J40 Bronchitis, not specified as acute or chronic: Secondary | ICD-10-CM

## 2019-01-25 MED ORDER — PREDNISONE 5 MG (21) PO TBPK: ORAL_TABLET | ORAL | 0 refills | Status: DC

## 2019-01-25 NOTE — Progress Notes (Signed)
We are sorry that you are not feeling well.  Here is how we plan to help!  Based on your presentation I believe you most likely have A cough due to a virus.  This is called viral bronchitis and is best treated by rest, plenty of fluids and control of the cough.  You may use Ibuprofen or Tylenol as directed to help your symptoms.     In addition you may use A non-prescription cough medication called Mucinex DM: take 2 tablets every 12 hours.  Prednisone 5 mg daily for 6 days (see taper instructions below)  Directions for 6 day taper: Day 1: 2 tablets before breakfast, 1 after both lunch & dinner and 2 at bedtime Day 2: 1 tab before breakfast, 1 after both lunch & dinner and 2 at bedtime Day 3: 1 tab at each meal & 1 at bedtime Day 4: 1 tab at breakfast, 1 at lunch, 1 at bedtime Day 5: 1 tab at breakfast & 1 tab at bedtime Day 6: 1 tab at breakfast   From your responses in the eVisit questionnaire you describe inflammation in the upper respiratory tract which is causing a significant cough.  This is commonly called Bronchitis and has four common causes:    Allergies  Viral Infections  Acid Reflux  Bacterial Infection Allergies, viruses and acid reflux are treated by controlling symptoms or eliminating the cause. An example might be a cough caused by taking certain blood pressure medications. You stop the cough by changing the medication. Another example might be a cough caused by acid reflux. Controlling the reflux helps control the cough.  USE OF BRONCHODILATOR ("RESCUE") INHALERS: There is a risk from using your bronchodilator too frequently.  The risk is that over-reliance on a medication which only relaxes the muscles surrounding the breathing tubes can reduce the effectiveness of medications prescribed to reduce swelling and congestion of the tubes themselves.  Although you feel brief relief from the bronchodilator inhaler, your asthma may actually be worsening with the tubes becoming  more swollen and filled with mucus.  This can delay other crucial treatments, such as oral steroid medications. If you need to use a bronchodilator inhaler daily, several times per day, you should discuss this with your provider.  There are probably better treatments that could be used to keep your asthma under control.     HOME CARE . Only take medications as instructed by your medical team. . Complete the entire course of an antibiotic. . Drink plenty of fluids and get plenty of rest. . Avoid close contacts especially the very young and the elderly . Cover your mouth if you cough or cough into your sleeve. . Always remember to wash your hands . A steam or ultrasonic humidifier can help congestion.   GET HELP RIGHT AWAY IF: . You develop worsening fever. . You become short of breath . You cough up blood. . Your symptoms persist after you have completed your treatment plan MAKE SURE YOU   Understand these instructions.  Will watch your condition.  Will get help right away if you are not doing well or get worse.  Your e-visit answers were reviewed by a board certified advanced clinical practitioner to complete your personal care plan.  Depending on the condition, your plan could have included both over the counter or prescription medications. If there is a problem please reply  once you have received a response from your provider. Your safety is important to [place].  If you  have drug allergies check your prescription carefully.    You can use MyChart to ask questions about today's visit, request a non-urgent call back, or ask for a work or school excuse for 24 hours related to this e-Visit. If it has been greater than 24 hours you will need to follow up with your provider, or enter a new e-Visit to address those concerns. You will get an e-mail in the next two days asking about your experience.  I hope that your e-visit has been valuable and will speed your recovery. Thank you for using  e-visits.  5 minutes spent reviewing and documenting in chart.

## 2019-02-11 NOTE — Progress Notes (Signed)
     Patient not available at time of evisit. Unable to reach by phone. No show.  This encounter was created in error - please disregard.

## 2019-02-12 ENCOUNTER — Encounter: Payer: Medicare Other | Admitting: Family Medicine

## 2019-02-12 ENCOUNTER — Encounter: Payer: Self-pay | Admitting: Family Medicine

## 2019-02-14 ENCOUNTER — Encounter: Payer: Self-pay | Admitting: Family Medicine

## 2019-02-14 ENCOUNTER — Ambulatory Visit (INDEPENDENT_AMBULATORY_CARE_PROVIDER_SITE_OTHER): Payer: Medicare Other | Admitting: Family Medicine

## 2019-02-14 VITALS — BP 156/84 | HR 67 | Temp 97.0°F

## 2019-02-14 DIAGNOSIS — F331 Major depressive disorder, recurrent, moderate: Secondary | ICD-10-CM

## 2019-02-14 DIAGNOSIS — E162 Hypoglycemia, unspecified: Secondary | ICD-10-CM | POA: Diagnosis not present

## 2019-02-14 DIAGNOSIS — R7303 Prediabetes: Secondary | ICD-10-CM | POA: Insufficient documentation

## 2019-02-14 DIAGNOSIS — F419 Anxiety disorder, unspecified: Secondary | ICD-10-CM | POA: Diagnosis not present

## 2019-02-14 DIAGNOSIS — I10 Essential (primary) hypertension: Secondary | ICD-10-CM | POA: Diagnosis not present

## 2019-02-14 MED ORDER — TRAZODONE HCL 50 MG PO TABS: 50.0000 mg | ORAL_TABLET | Freq: Every day | ORAL | 3 refills | Status: DC

## 2019-02-14 MED ORDER — LOSARTAN POTASSIUM 50 MG PO TABS: 50.0000 mg | ORAL_TABLET | Freq: Every day | ORAL | 1 refills | Status: DC

## 2019-02-14 MED ORDER — HYDROCHLOROTHIAZIDE 25 MG PO TABS: 25.0000 mg | ORAL_TABLET | Freq: Every day | ORAL | 1 refills | Status: DC

## 2019-02-14 MED ORDER — BUPROPION HCL ER (XL) 300 MG PO TB24: 300.0000 mg | ORAL_TABLET | Freq: Every day | ORAL | 1 refills | Status: DC

## 2019-02-14 NOTE — Assessment & Plan Note (Signed)
Uncontrolled Discussed importance of therapy Continue cymbalta 60mg  daily Increase wellbutrin to 300mg  daily Contracted for safety - no SI/HI F/u in 1 month Continue trazodone for sleep

## 2019-02-14 NOTE — Assessment & Plan Note (Signed)
Uncontrolled  Continue cymbalta at current dose Xanax has been d/c'd Discussed importance of therapy Increase Wellbutrin to 300mg  daily F/u in 1 months and reassess control May need to consider addition of buspar

## 2019-02-14 NOTE — Assessment & Plan Note (Signed)
A few episodes after high carb loads Discussed low carb diet Check A1c to ensure no T2DM

## 2019-02-14 NOTE — Assessment & Plan Note (Signed)
Uncontrolled Will increase Losartan to 50mg  daily Decrease HCTZ to 25mg  daily as she is having significant cramping on current high dose Plan to recheck Metabolic panel at next in person visit

## 2019-02-14 NOTE — Progress Notes (Signed)
Patient: Marcia Livingston Female    DOB: 04-May-1946   73 y.o.   MRN: [id number] Visit Date: 02/14/2019  Today's Provider: Lavon Paganini, MD   Chief Complaint  Patient presents with   Anxiety   Depression   Subjective:    Virtual Visit via Video Note  I connected with [facility] on 02/14/19 at  1:20 PM EDT by a video enabled telemedicine application and verified that I am speaking with the correct person using two identifiers.   Patient location: home Provider location: home office  Persons involved in the visit: patient, provider    I discussed the limitations of evaluation and management by telemedicine and the availability of in person appointments. The patient expressed understanding and agreed to proceed.  HPI  Hypertension, follow-up:     BP Readings from Last 3 Encounters:  08/09/18 (!) 148/66  08/09/18 (!) 148/66  07/04/18 (!) 144/78    She was last seen for hypertension 6 months ago.  BP at that visit was 148/66. Management since that visit includes no changes. She reports good compliance with treatment. She is not having side effects.  She is not exercising. She is adherent to low salt diet.   Outside blood pressures are not being checked at home. She is experiencing none.  Patient denies chest pain, chest pressure/discomfort, claudication, dyspnea, exertional chest pressure/discomfort, fatigue, irregular heart beat, lower extremity edema, near-syncope, orthopnea, palpitations, paroxysmal nocturnal dyspnea, syncope and tachypnea.   Cardiovascular risk factors include advanced age (older than 89 for men, 57 for women), dyslipidemia and hypertension.  Use of agents associated with hypertension: none.    Getting a lot of leg cramps  Patient also reports a few episodes of the "shakes." Daughter checked blood sugar and it was in the 50s.  She had eaten a sugary breakfast ~4 hours  previously. ------------------------------------------------------------------------   Lipid/Cholesterol, Follow-up:   Last seen for this 6 months ago.  Management since that visit includes no changes.  Last Lipid Panel: Lab Results  Component Value Date   CHOL 151 08/09/2018   HDL 36 (L) 08/09/2018   LDLCALC 81 08/09/2018   TRIG 170 (H) 08/09/2018   CHOLHDL 4.2 08/09/2018    She reports good compliance with treatment. She is not having side effects.      Wt Readings from Last 3 Encounters:  08/09/18 217 lb 3.2 oz (98.5 kg)  08/09/18 217 lb 3.2 oz (98.5 kg)  07/04/18 218 lb (98.9 kg)    ------------------------------------------------------------------------ Anxiety & Depression:  Patient was last seen for this 6 months ago. Management since then includes continuing current medications. Patient reports good compliance with treatment plan. She is not having side effects. She states symptoms are worse since last visit.   Not worse since pandemic Worse since Christmas  No longer taking Xanax for several months Trazodone 50mg  daily Cymbalta 60mg  and Wellbutrin XL 150mg  daily Got so frustrated that she broke a candle stick the other day Tried therapy in the past - several years ago - didn't find it helpful Worse since losing her daughter Marcia Livingston Miss like Cymbalta helped in the past, but not recently  Using CBD oil occasionally  GAD 7 : Generalized Anxiety Score 02/14/2019 01/16/2018  Nervous, Anxious, on Edge 1 2  Control/stop worrying 2 0  Worry too much - different things 2 3  Trouble relaxing 0 3  Restless 2 2  Easily annoyed or irritable 2 3  Afraid - awful might happen 3 1  Total GAD 7 Score 12 14  Anxiety Difficulty Somewhat difficult Somewhat difficult    Depression screen [facility] 2/9 02/14/2019 08/09/2018 01/16/2018  Decreased Interest 2 1 1   Down, Depressed, Hopeless 2 0 0  PHQ - 2 Score 4 1 1   Altered sleeping 2 - 0  Tired, decreased energy 2 - 0  Change in  appetite 2 - 0  Feeling bad or failure about yourself  1 - 1  Trouble concentrating 3 - 0  Moving slowly or fidgety/restless 0 - 0  Suicidal thoughts 0 - 0  PHQ-9 Score 14 - 2  Difficult doing work/chores Somewhat difficult - Not difficult at all    Allergies  Allergen Reactions   Penicillins Rash    Has patient had a PCN reaction causing immediate rash, facial/tongue/throat swelling, SOB or lightheadedness with hypotension: [id number]} Has patient had a PCN reaction causing severe rash involving mucus membranes or skin necrosis: no:[id number]} Has patient had a PCN reaction that required hospitalization no:[id number]} Has patient had a PCN reaction occurring within the last 10 years: no:[id number]} If all of the above answers are "NO", then may proceed with Cephalosporin use.    Codeine Hives   Sulfa Antibiotics Hives     Current Outpatient Medications:    albuterol (PROVENTIL HFA;VENTOLIN HFA) 108 (90 Base) MCG/ACT inhaler, Inhale 2 puffs into the lungs every 6 (six) hours as needed for wheezing or shortness of breath., Disp: 1 Inhaler, Rfl: 2   aspirin EC 81 MG tablet, Take 81 mg by mouth daily., Disp: , Rfl:    buPROPion (WELLBUTRIN XL) 300 MG 24 hr tablet, Take 1 tablet (300 mg total) by mouth daily., Disp: 90 tablet, Rfl: 1   cholecalciferol (VITAMIN D) 1000 units tablet, Take 1,000 Units by mouth daily., Disp: , Rfl:    colestipol (COLESTID) 1 g tablet, Take 1 g by mouth daily. , Disp: , Rfl:    DULoxetine (CYMBALTA) 60 MG capsule, Take 1 capsule (60 mg total) by mouth daily., Disp: 90 capsule, Rfl: 3   fluticasone (FLONASE) 50 MCG/ACT nasal spray, Place 2 sprays into both nostrils daily. (Patient taking differently: Place 2 sprays into both nostrils daily. ), Disp: 16 g, Rfl: 6   Fluticasone-Salmeterol (WIXELA INHUB) 250-50 MCG/DOSE AEPB, Inhale 1 puff into the lungs 2 (two) times daily., Disp: , Rfl:    hydrochlorothiazide (HYDRODIURIL) 25 MG tablet, Take 1 tablet (25  mg total) by mouth daily., Disp: 90 tablet, Rfl: 1   ranitidine (ZANTAC) 150 MG tablet, Take 150 mg by mouth at bedtime. , Disp: , Rfl:    rosuvastatin (CRESTOR) 5 MG tablet, Take 5 mg by mouth 3 (three) times a week., Disp: , Rfl:    losartan (COZAAR) 50 MG tablet, Take 1 tablet (50 mg total) by mouth daily., Disp: 90 tablet, Rfl: 1   traZODone (DESYREL) 50 MG tablet, Take 1 tablet (50 mg total) by mouth at bedtime., Disp: 90 tablet, Rfl: 3  Review of Systems  Constitutional: Negative.   Respiratory: Negative.   Cardiovascular: Negative.   Musculoskeletal: Negative.   Psychiatric/Behavioral: The patient is nervous/anxious.        Depression     Social History   Tobacco Use   Smoking status: Former Smoker    Packs/day: 1.00    Years: 30.00    Pack years: 30.00    Types: Cigarettes    Last attempt to quit: 10/15/1996    Years since quitting: 22.3   Smokeless tobacco: Never Used  Substance Use  Topics   Alcohol use: Not Currently      Objective:   BP (!) 156/84    Pulse 67    Temp (!) 97 F (36.1 C)    SpO2 97%  Vitals:   02/14/19 1305  BP: (!) 156/84  Pulse: 67  Temp: (!) 97 F (36.1 C)  SpO2: 97%     Physical Exam Constitutional:      Appearance: Normal appearance.  Pulmonary:     Effort: Pulmonary effort is normal. No respiratory distress.  Neurological:     Mental Status: She is alert. Mental status is at baseline.  Psychiatric:        Mood and Affect: Mood is anxious and depressed. Affect is labile.        Speech: Speech normal.        Behavior: Behavior normal.        Thought Content: Thought content does not include homicidal or suicidal ideation.         Assessment & Plan      I discussed the assessment and treatment plan with the patient. The patient was provided an opportunity to ask questions and all were answered. The patient agreed with the plan and demonstrated an understanding of the instructions.   The patient was advised to call  back or seek an in-person evaluation if the symptoms worsen or if the condition fails to improve as anticipated.  Problem List Items Addressed This Visit      Cardiovascular and Mediastinum   HTN (hypertension)    Uncontrolled Will increase Losartan to 50mg  daily Decrease HCTZ to 25mg  daily as she is having significant cramping on current high dose Plan to recheck Metabolic panel at next in person visit      Relevant Medications   hydrochlorothiazide (HYDRODIURIL) 25 MG tablet   losartan (COZAAR) 50 MG tablet     Endocrine   Hypoglycemia    A few episodes after high carb loads Discussed low carb diet Check A1c to ensure no T2DM        Other   Anxiety - Primary    Uncontrolled  Continue cymbalta at current dose Xanax has been d/c'd Discussed importance of therapy Increase Wellbutrin to 300mg  daily F/u in 1 months and reassess control May need to consider addition of buspar      Relevant Medications   traZODone (DESYREL) 50 MG tablet   buPROPion (WELLBUTRIN XL) 300 MG 24 hr tablet   Depression    Uncontrolled Discussed importance of therapy Continue cymbalta 60mg  daily Increase wellbutrin to 300mg  daily Contracted for safety - no SI/HI F/u in 1 month Continue trazodone for sleep      Relevant Medications   traZODone (DESYREL) 50 MG tablet   buPROPion (WELLBUTRIN XL) 300 MG 24 hr tablet       Return in about 4 weeks (around 03/14/2019) for BP, axiety/depression f/u.   The entirety of the information documented in the History of Present Illness, Review of Systems and Physical Exam were personally obtained by me. Portions of this information were initially documented by Tiburcio Pea, CMA and reviewed by me for thoroughness and accuracy.    Virginia Crews, MD, MPH [facility] 02/14/2019 3:24 PM

## 2019-02-14 NOTE — Patient Instructions (Signed)
Changes in meds: Wellbutrin XL 300 mg daily Losartan 50mg  daily HCTZ 25 mg daily

## 2019-02-17 ENCOUNTER — Other Ambulatory Visit (INDEPENDENT_AMBULATORY_CARE_PROVIDER_SITE_OTHER): Payer: Medicare Other

## 2019-02-17 ENCOUNTER — Telehealth: Payer: Self-pay

## 2019-02-17 ENCOUNTER — Encounter: Payer: Self-pay | Admitting: Family Medicine

## 2019-02-17 DIAGNOSIS — E162 Hypoglycemia, unspecified: Secondary | ICD-10-CM | POA: Diagnosis not present

## 2019-02-17 LAB — POCT GLYCOSYLATED HEMOGLOBIN (HGB A1C): Hemoglobin A1C: 5.9 % — AB (ref 4.0–5.6)

## 2019-02-17 NOTE — Telephone Encounter (Signed)
-----   Message from Virginia Crews, MD sent at 02/17/2019 11:50 AM EDT ----- A1c shows prediabetes.  This is at risk of progressing to diabetes.  Would like to control blood sugar by decreasing carbohydrate (breads, pasta, rice, potatoes, corn, sweets) and increasing exercise as possible.  Can recheck in 3-6 months

## 2019-02-17 NOTE — Telephone Encounter (Signed)
LMTCB, results and result note released to My Chart

## 2019-02-19 NOTE — Telephone Encounter (Signed)
Spoke to pt and advised results and pt will call back to schedule appt for recheck.  dbs

## 2019-02-26 ENCOUNTER — Encounter: Payer: Self-pay | Admitting: Family Medicine

## 2019-03-17 ENCOUNTER — Encounter: Payer: Self-pay | Admitting: Family Medicine

## 2019-03-17 ENCOUNTER — Ambulatory Visit (INDEPENDENT_AMBULATORY_CARE_PROVIDER_SITE_OTHER): Payer: Medicare Other | Admitting: Family Medicine

## 2019-03-17 VITALS — BP 129/74 | HR 78

## 2019-03-17 DIAGNOSIS — F419 Anxiety disorder, unspecified: Secondary | ICD-10-CM | POA: Diagnosis not present

## 2019-03-17 DIAGNOSIS — I10 Essential (primary) hypertension: Secondary | ICD-10-CM | POA: Diagnosis not present

## 2019-03-17 DIAGNOSIS — F331 Major depressive disorder, recurrent, moderate: Secondary | ICD-10-CM

## 2019-03-17 DIAGNOSIS — R7303 Prediabetes: Secondary | ICD-10-CM | POA: Diagnosis not present

## 2019-03-17 DIAGNOSIS — F5104 Psychophysiologic insomnia: Secondary | ICD-10-CM | POA: Diagnosis not present

## 2019-03-17 NOTE — Assessment & Plan Note (Signed)
Well controlled Continue trazodone

## 2019-03-17 NOTE — Progress Notes (Signed)
Patient: Marcia Livingston Female    DOB: 12-Feb-1946   73 y.o.   MRN: [id number] Visit Date: 03/17/2019  Today's Provider: Lavon Paganini, MD   Chief Complaint  Patient presents with  . Hypertension  . Anxiety  . Depression   Subjective:    Virtual Visit via Video Note  I connected with [place] on 03/17/19 at 10:40 AM EDT by a video enabled telemedicine application and verified that I am speaking with the correct person using two identifiers.   Patient location: home Provider location: [place] involved in the visit: patient, provider   I discussed the limitations of evaluation and management by telemedicine and the availability of in person appointments. The patient expressed understanding and agreed to proceed.   HPI  Hypertension, follow-up:  BP Readings from Last 3 Encounters:  03/17/19 129/74  02/14/19 (!) 156/84  08/09/18 (!) 148/66    She was last seen for hypertension 1 months ago.  BP at that visit was 156/84. Management changes since that visit include increase Losartan to 50 mg and decrease HCTZ to 25 mg daily. She reports good compliance with treatment. She is not having side effects.  She is not exercising. She is adherent to low salt diet.   Outside blood pressures are not being checked at this time.  She is experiencing none.  Leg cramps have resolved since decreasing HCTZ dose Patient denies chest pain, chest pressure/discomfort, claudication, dyspnea, exertional chest pressure/discomfort, fatigue, irregular heart beat, lower extremity edema, near-syncope, orthopnea, palpitations, paroxysmal nocturnal dyspnea, syncope and tachypnea.   Cardiovascular risk factors include advanced age (older than 69 for men, 18 for women), diabetes mellitus, hypertension and obesity (BMI >= 30 kg/m2).  Use of agents associated with hypertension: none.     Weight trend: stable Wt Readings from Last 3 Encounters:  08/09/18 217 lb 3.2 oz  (98.5 kg)  08/09/18 217 lb 3.2 oz (98.5 kg)  07/04/18 218 lb (98.9 kg)    ------------------------------------------------------------------------ Anxiety/Depression:  Patient presents for a 1 month follow up. Last visit was on 02/14/2019. Patient advised to continue Cymbalta, D/C Xanax and increase Wellbutrin to 300 mg. She reports good compliance with treatment plan. She states symptoms are significantly improved.   GAD 7 : Generalized Anxiety Score 03/17/2019 02/14/2019 01/16/2018  Nervous, Anxious, on Edge 0 1 2  Control/stop worrying 0 2 0  Worry too much - different things 1 2 3   Trouble relaxing 0 0 3  Restless 0 2 2  Easily annoyed or irritable 0 2 3  Afraid - awful might happen 0 3 1  Total GAD 7 Score 1 12 14   Anxiety Difficulty Not difficult at all Somewhat difficult Somewhat difficult     Depression screen [facility] 2/9 03/17/2019 02/14/2019 08/09/2018 01/16/2018  Decreased Interest 0 2 1 1   Down, Depressed, Hopeless 0 2 0 0  PHQ - 2 Score 0 4 1 1   Altered sleeping - 2 - 0  Tired, decreased energy - 2 - 0  Change in appetite - 2 - 0  Feeling bad or failure about yourself  - 1 - 1  Trouble concentrating - 3 - 0  Moving slowly or fidgety/restless - 0 - 0  Suicidal thoughts - 0 - 0  PHQ-9 Score - 14 - 2  Difficult doing work/chores - Somewhat difficult - Not difficult at all      Allergies  Allergen Reactions  . Penicillins Rash    Has patient  had a PCN reaction causing immediate rash, facial/tongue/throat swelling, SOB or lightheadedness with hypotension: UEA:[id number]} Has patient had a PCN reaction causing severe rash involving mucus membranes or skin necrosis: no:[id number]} Has patient had a PCN reaction that required hospitalization no:[id number]} Has patient had a PCN reaction occurring within the last 10 years: no:[id number]} If all of the above answers are "NO", then may proceed with Cephalosporin use.   . Codeine Hives  . Sulfa Antibiotics Hives     Current Outpatient  Medications:  .  aspirin EC 81 MG tablet, Take 81 mg by mouth daily., Disp: , Rfl:  .  buPROPion (WELLBUTRIN XL) 300 MG 24 hr tablet, Take 1 tablet (300 mg total) by mouth daily., Disp: 90 tablet, Rfl: 1 .  cholecalciferol (VITAMIN D) 1000 units tablet, Take 1,000 Units by mouth daily., Disp: , Rfl:  .  colestipol (COLESTID) 1 g tablet, Take 1 g by mouth daily. , Disp: , Rfl:  .  DULoxetine (CYMBALTA) 60 MG capsule, Take 1 capsule (60 mg total) by mouth daily., Disp: 90 capsule, Rfl: 3 .  hydrochlorothiazide (HYDRODIURIL) 25 MG tablet, Take 1 tablet (25 mg total) by mouth daily., Disp: 90 tablet, Rfl: 1 .  losartan (COZAAR) 50 MG tablet, Take 1 tablet (50 mg total) by mouth daily., Disp: 90 tablet, Rfl: 1 .  ranitidine (ZANTAC) 150 MG tablet, Take 150 mg by mouth at bedtime. , Disp: , Rfl:  .  rosuvastatin (CRESTOR) 5 MG tablet, Take 5 mg by mouth 3 (three) times a week., Disp: , Rfl:  .  traZODone (DESYREL) 50 MG tablet, Take 1 tablet (50 mg total) by mouth at bedtime., Disp: 90 tablet, Rfl: 3  Review of Systems  Constitutional: Negative.   Respiratory: Negative.   Cardiovascular: Negative.   Musculoskeletal: Negative.   Psychiatric/Behavioral: Positive for sleep disturbance. The patient is nervous/anxious.        Depression     Social History   Tobacco Use  . Smoking status: Former Smoker    Packs/day: 1.00    Years: 30.00    Pack years: 30.00    Types: Cigarettes    Last attempt to quit: 10/15/1996    Years since quitting: 22.4  . Smokeless tobacco: Never Used  Substance Use Topics  . Alcohol use: Not Currently      Objective:   BP 129/74   Pulse 78  Vitals:   03/17/19 1030  BP: 129/74  Pulse: 78     Physical Exam Vitals signs reviewed.  Constitutional:      Appearance: Normal appearance.  Pulmonary:     Effort: Pulmonary effort is normal. No respiratory distress.  Neurological:     Mental Status: She is alert and oriented to person, place, and time. Mental  status is at baseline.  Psychiatric:        Mood and Affect: Mood normal.        Behavior: Behavior normal.         Assessment & Plan    I discussed the assessment and treatment plan with the patient. The patient was provided an opportunity to ask questions and all were answered. The patient agreed with the plan and demonstrated an understanding of the instructions.   The patient was advised to call back or seek an in-person evaluation if the symptoms worsen or if the condition fails to improve as anticipated.   Problem List Items Addressed This Visit      Cardiovascular and Mediastinum   HTN (hypertension) -  Primary    Well-controlled Continue losartan 50 mg daily and HCTZ 25 mg daily Leg cramping has resolved since lowering HCTZ dose Plan to recheck metabolic panel at next in-person visit        Other   Anxiety    Well-controlled Off of all benzos Continue Cymbalta and Wellbutrin at current dose Discussed importance of therapy Follow-up at next visit in 4 to 5 months and repeat PHQ 9 and GAD 7 at that time      Depression    Well controlled Continue Cymbalta 60mg  daily, Wellbutrin 300mg  daily No SI/HI Follow-up at next visit in 4 to 5 months Repeat PHQ 9 and GAD 7 at next visit      Insomnia    Well controlled Continue trazodone      Prediabetes    Went over A1c results with the patient Discussed importance of low-carb diet and exercise No indication for medication at this time Recheck in about 6 months          Return in about 4 months (around 07/17/2019) for AWV and chronic disease f/u after 10/25.   The entirety of the information documented in the History of Present Illness, Review of Systems and Physical Exam were personally obtained by me. Portions of this information were initially documented by Tiburcio Pea, CMA and reviewed by me for thoroughness and accuracy.    Jacquez Sheetz, Dionne Bucy, MD MPH [facility]

## 2019-03-17 NOTE — Assessment & Plan Note (Signed)
Well-controlled Continue losartan 50 mg daily and HCTZ 25 mg daily Leg cramping has resolved since lowering HCTZ dose Plan to recheck metabolic panel at next in-person visit

## 2019-03-17 NOTE — Assessment & Plan Note (Signed)
Well-controlled Off of all benzos Continue Cymbalta and Wellbutrin at current dose Discussed importance of therapy Follow-up at next visit in 4 to 5 months and repeat PHQ 9 and GAD 7 at that time

## 2019-03-17 NOTE — Assessment & Plan Note (Signed)
Well controlled Continue Cymbalta 60mg  daily, Wellbutrin 300mg  daily No SI/HI Follow-up at next visit in 4 to 5 months Repeat PHQ 9 and GAD 7 at next visit

## 2019-03-17 NOTE — Assessment & Plan Note (Signed)
Went over A1c results with the patient Discussed importance of low-carb diet and exercise No indication for medication at this time Recheck in about 6 months

## 2019-04-11 ENCOUNTER — Encounter: Payer: Self-pay | Admitting: Family Medicine

## 2019-04-11 NOTE — Telephone Encounter (Signed)
I have called this patient and her daughter regarding SOB and pulse ox reading. Patient has a history of mild aortic stenosis, HTN, and reported asthma. She had an episode earlier today where she felt like she couldn't get a deep breath and has an in-home pulse ox which read 93%. She then switched it to her right hand and it read 96%. She does not feel SOB, she is currently in the car to go look at a house. She has no fevers, chills, nausea, vomiting, exposure to sick contacts. She does not have chest pain or palpitations. With her history and symptoms, I think she should be seen in the ER especially if she gets worse. Patient and her daughter express understanding and agree.

## 2019-04-16 DIAGNOSIS — R0602 Shortness of breath: Secondary | ICD-10-CM | POA: Diagnosis not present

## 2019-04-16 DIAGNOSIS — E782 Mixed hyperlipidemia: Secondary | ICD-10-CM | POA: Diagnosis not present

## 2019-04-16 DIAGNOSIS — G4733 Obstructive sleep apnea (adult) (pediatric): Secondary | ICD-10-CM | POA: Diagnosis not present

## 2019-04-16 DIAGNOSIS — R079 Chest pain, unspecified: Secondary | ICD-10-CM | POA: Diagnosis not present

## 2019-04-16 DIAGNOSIS — I350 Nonrheumatic aortic (valve) stenosis: Secondary | ICD-10-CM | POA: Diagnosis not present

## 2019-04-16 DIAGNOSIS — Z9989 Dependence on other enabling machines and devices: Secondary | ICD-10-CM | POA: Diagnosis not present

## 2019-04-16 DIAGNOSIS — R0600 Dyspnea, unspecified: Secondary | ICD-10-CM | POA: Diagnosis not present

## 2019-04-16 DIAGNOSIS — R609 Edema, unspecified: Secondary | ICD-10-CM | POA: Diagnosis not present

## 2019-04-16 DIAGNOSIS — I10 Essential (primary) hypertension: Secondary | ICD-10-CM | POA: Diagnosis not present

## 2019-04-16 DIAGNOSIS — Z9889 Other specified postprocedural states: Secondary | ICD-10-CM | POA: Diagnosis not present

## 2019-04-16 DIAGNOSIS — R011 Cardiac murmur, unspecified: Secondary | ICD-10-CM | POA: Diagnosis not present

## 2019-05-01 DIAGNOSIS — R079 Chest pain, unspecified: Secondary | ICD-10-CM | POA: Diagnosis not present

## 2019-05-01 DIAGNOSIS — I350 Nonrheumatic aortic (valve) stenosis: Secondary | ICD-10-CM | POA: Diagnosis not present

## 2019-05-01 DIAGNOSIS — R0600 Dyspnea, unspecified: Secondary | ICD-10-CM | POA: Diagnosis not present

## 2019-05-06 ENCOUNTER — Other Ambulatory Visit: Payer: Self-pay | Admitting: Family Medicine

## 2019-05-06 DIAGNOSIS — Z1231 Encounter for screening mammogram for malignant neoplasm of breast: Secondary | ICD-10-CM

## 2019-05-08 DIAGNOSIS — Z9889 Other specified postprocedural states: Secondary | ICD-10-CM | POA: Diagnosis not present

## 2019-05-08 DIAGNOSIS — Z9989 Dependence on other enabling machines and devices: Secondary | ICD-10-CM | POA: Diagnosis not present

## 2019-05-08 DIAGNOSIS — E782 Mixed hyperlipidemia: Secondary | ICD-10-CM | POA: Diagnosis not present

## 2019-05-08 DIAGNOSIS — R609 Edema, unspecified: Secondary | ICD-10-CM | POA: Diagnosis not present

## 2019-05-08 DIAGNOSIS — G4733 Obstructive sleep apnea (adult) (pediatric): Secondary | ICD-10-CM | POA: Diagnosis not present

## 2019-05-08 DIAGNOSIS — I10 Essential (primary) hypertension: Secondary | ICD-10-CM | POA: Diagnosis not present

## 2019-05-08 DIAGNOSIS — I350 Nonrheumatic aortic (valve) stenosis: Secondary | ICD-10-CM | POA: Diagnosis not present

## 2019-06-03 ENCOUNTER — Telehealth: Payer: Medicare Other | Admitting: Nurse Practitioner

## 2019-06-03 DIAGNOSIS — J029 Acute pharyngitis, unspecified: Secondary | ICD-10-CM

## 2019-06-03 DIAGNOSIS — R6889 Other general symptoms and signs: Secondary | ICD-10-CM

## 2019-06-03 DIAGNOSIS — Z20822 Contact with and (suspected) exposure to covid-19: Secondary | ICD-10-CM

## 2019-06-03 DIAGNOSIS — R05 Cough: Secondary | ICD-10-CM | POA: Diagnosis not present

## 2019-06-03 DIAGNOSIS — R059 Cough, unspecified: Secondary | ICD-10-CM

## 2019-06-03 MED ORDER — BENZONATATE 100 MG PO CAPS: 100.0000 mg | ORAL_CAPSULE | Freq: Three times a day (TID) | ORAL | 0 refills | Status: DC | PRN

## 2019-06-03 NOTE — Progress Notes (Signed)
E-Visit for Corona Virus Screening  You need to make sure this is not covid before you do anything else. Please see below Your current symptoms could be consistent with the coronavirus.  Many health care providers can now test patients at their office but not all are.  [facility] has multiple testing sites. For information on our COVID testing locations and hours go to [email or web address]  Please quarantine yourself while awaiting your test results.  We are enrolling you in our [place] for [place] . Daily you will receive a questionnaire within the [facility] website. Our COVID 19 response team willl be monitoriing your responses daily.  You can go to one of the  testing sites listed below, while they are opened (see hours). You do not need to have a doctors order to have testing done. You do need to self-isolate until your results return and if positive 14 days from when your symptoms started and until you are 3 days symptom free.   Testing Locations (Monday - Friday, 8 a.m. - 3:30 p.m.) . [place]: [facility], [address], [place] [employer], [place]: [place], [place], [place], [place] (entrance off [address] [address])  . [place] [address], [place], [place] (across from [facility])      COVID-19 is a respiratory illness with symptoms that are similar to the flu. Symptoms are typically mild to moderate, but there have been cases of severe illness and death due to the virus. The following symptoms may appear 2-14 days after exposure: . Fever . Cough . Shortness of breath or difficulty breathing . Chills . Repeated shaking with chills . Muscle pain . Headache . Sore throat . New loss of taste or smell . Fatigue . Congestion or runny nose . Nausea or vomiting . Diarrhea  It is vitally important that if you feel that you have an infection  such as this virus or any other virus that you stay home and away from places where you may spread it to others.  You should self-quarantine for 14 days if you have symptoms that could potentially be coronavirus or have been in close contact a with a person diagnosed with COVID-19 within the last 2 weeks. You should avoid contact with people age 74 and older.   You should wear a mask or cloth face covering over your nose and mouth if you must be around other people or animals, including pets (even at home). Try to stay at least 6 feet away from other people. This will protect the people around you.  You can use medication such as A prescription cough medication called Tessalon Perles 100 mg. You may take 1-2 capsules every 8 hours as needed for cough  You may also take acetaminophen (Tylenol) as needed for fever.   Reduce your risk of any infection by using the same precautions used for avoiding the common cold or flu:  Marland Kitchen Wash your hands often with soap and warm water for at least 20 seconds.  If soap and water are not readily available, use an alcohol-based hand sanitizer with at least 60% alcohol.  . If coughing or sneezing, cover your mouth and nose by coughing or sneezing into the elbow areas of your shirt or coat, into a tissue or into your sleeve (not your hands). . Avoid shaking hands with others and consider head nods or verbal greetings only. . Avoid touching your eyes,  nose, or mouth with unwashed hands.  . Avoid close contact with people who are sick. . Avoid places or events with large numbers of people in one location, like concerts or sporting events. . Carefully consider travel plans you have or are making. . If you are planning any travel outside or inside the [place], visit the CDC's Travelers' Health webpage for the latest health notices. . If you have some symptoms but not all symptoms, continue to monitor at home and seek medical attention if your symptoms worsen. . If you are  having a medical emergency, call 911.  HOME CARE . Only take medications as instructed by your medical team. . Drink plenty of fluids and get plenty of rest. . A steam or ultrasonic humidifier can help if you have congestion.   GET HELP RIGHT AWAY IF YOU HAVE EMERGENCY WARNING SIGNS** FOR COVID-19. If you or someone is showing any of these signs seek emergency medical care immediately. Call 911 or proceed to your closest emergency facility if: . You develop worsening high fever. . Trouble breathing . Bluish lips or face . Persistent pain or pressure in the chest . New confusion . Inability to wake or stay awake . You cough up blood. . Your symptoms become more severe  **This list is not all possible symptoms. Contact your medical provider for any symptoms that are sever or concerning to you.   MAKE SURE YOU   Understand these instructions.  Will watch your condition.  Will get help right away if you are not doing well or get worse.  Your e-visit answers were reviewed by a board certified advanced clinical practitioner to complete your personal care plan.  Depending on the condition, your plan could have included both over the counter or prescription medications.  If there is a problem please reply once you have received a response from your provider.  Your safety is important to [place].  If you have drug allergies check your prescription carefully.    You can use MyChart to ask questions about today's visit, request a non-urgent call back, or ask for a work or school excuse for 24 hours related to this e-Visit. If it has been greater than 24 hours you will need to follow up with your provider, or enter a new e-Visit to address those concerns. You will get an e-mail in the next two days asking about your experience.  I hope that your e-visit has been valuable and will speed your recovery. Thank you for using e-visits.   5-10 minutes spent reviewing and documenting in chart.

## 2019-06-04 ENCOUNTER — Other Ambulatory Visit: Payer: Self-pay | Admitting: Internal Medicine

## 2019-06-04 DIAGNOSIS — R6889 Other general symptoms and signs: Secondary | ICD-10-CM | POA: Diagnosis not present

## 2019-06-04 DIAGNOSIS — Z20822 Contact with and (suspected) exposure to covid-19: Secondary | ICD-10-CM

## 2019-06-05 LAB — NOVEL CORONAVIRUS, NAA: SARS-CoV-2, NAA: NOT DETECTED

## 2019-06-06 ENCOUNTER — Other Ambulatory Visit: Payer: Self-pay

## 2019-06-06 ENCOUNTER — Encounter: Payer: Self-pay | Admitting: Family Medicine

## 2019-06-06 DIAGNOSIS — R6889 Other general symptoms and signs: Secondary | ICD-10-CM | POA: Diagnosis not present

## 2019-06-06 DIAGNOSIS — Z20822 Contact with and (suspected) exposure to covid-19: Secondary | ICD-10-CM

## 2019-06-06 MED ORDER — DOXYCYCLINE HYCLATE 100 MG PO TABS: 100.0000 mg | ORAL_TABLET | Freq: Two times a day (BID) | ORAL | 0 refills | Status: DC

## 2019-06-06 NOTE — Addendum Note (Signed)
Addended by: Chevis Pretty on: 06/06/2019 04:33 PM   Modules accepted: Orders

## 2019-06-08 LAB — NOVEL CORONAVIRUS, NAA: SARS-CoV-2, NAA: NOT DETECTED

## 2019-06-12 ENCOUNTER — Ambulatory Visit
Admission: RE | Admit: 2019-06-12 | Discharge: 2019-06-12 | Disposition: A | Discharge status: Home / Self Care | Payer: Medicare Other | Source: Ambulatory Visit | Attending: Family Medicine | Admitting: Family Medicine

## 2019-06-12 DIAGNOSIS — Z1231 Encounter for screening mammogram for malignant neoplasm of breast: Secondary | ICD-10-CM | POA: Insufficient documentation

## 2019-06-30 ENCOUNTER — Other Ambulatory Visit: Payer: Self-pay | Admitting: Family Medicine

## 2019-07-01 DIAGNOSIS — Z03818 Encounter for observation for suspected exposure to other biological agents ruled out: Secondary | ICD-10-CM | POA: Diagnosis not present

## 2019-07-01 DIAGNOSIS — J019 Acute sinusitis, unspecified: Secondary | ICD-10-CM | POA: Diagnosis not present

## 2019-07-01 DIAGNOSIS — J40 Bronchitis, not specified as acute or chronic: Secondary | ICD-10-CM | POA: Diagnosis not present

## 2019-07-01 DIAGNOSIS — R05 Cough: Secondary | ICD-10-CM | POA: Diagnosis not present

## 2019-07-01 DIAGNOSIS — Z20828 Contact with and (suspected) exposure to other viral communicable diseases: Secondary | ICD-10-CM | POA: Diagnosis not present

## 2019-07-08 DIAGNOSIS — J209 Acute bronchitis, unspecified: Secondary | ICD-10-CM | POA: Diagnosis not present

## 2019-07-08 DIAGNOSIS — R05 Cough: Secondary | ICD-10-CM | POA: Diagnosis not present

## 2019-07-17 DIAGNOSIS — J411 Mucopurulent chronic bronchitis: Secondary | ICD-10-CM | POA: Diagnosis not present

## 2019-07-28 DIAGNOSIS — Z1159 Encounter for screening for other viral diseases: Secondary | ICD-10-CM | POA: Diagnosis not present

## 2019-07-30 DIAGNOSIS — R0600 Dyspnea, unspecified: Secondary | ICD-10-CM | POA: Diagnosis not present

## 2019-08-05 DIAGNOSIS — J449 Chronic obstructive pulmonary disease, unspecified: Secondary | ICD-10-CM | POA: Diagnosis not present

## 2019-08-12 DIAGNOSIS — Z23 Encounter for immunization: Secondary | ICD-10-CM | POA: Diagnosis not present

## 2019-08-12 NOTE — Progress Notes (Signed)
Subjective:   Marcia Livingston is a 73 y.o. female who presents for Medicare Annual (Subsequent) preventive examination.    This visit is being conducted through telemedicine due to the COVID-19 pandemic. This patient has given me verbal consent via doximity to conduct this visit, patient states they are participating from their home address. Some vital signs may be absent or patient reported.    Patient identification: identified by name, DOB, and current address  Review of Systems:  N/A  Cardiac Risk Factors include: advanced age (>12men, >24 women);dyslipidemia;hypertension     Objective:     Vitals: There were no vitals taken for this visit.  There is no height or weight on file to calculate BMI. Unable to obtain vitals due to visit being conducted via telephonically.   Advanced Directives 08/13/2019 08/09/2018 05/19/2017 02/01/2016 10/23/2015 10/22/2015  Does Patient Have a Medical Advance Directive? Yes Yes Yes Yes Yes Yes  Type of Paramedic of [place];Living will [place];Living will Healthcare Power of [place];Living will Healthcare Power of [place]  Does patient want to make changes to medical advance directive? - - Yes (Inpatient - patient defers changing a medical advance directive at this time) No - Patient declined - -  Copy of [place] in Chart? No - copy requested No - copy requested No - copy requested No - copy requested No - copy requested No - copy requested  Would patient like information on creating a medical advance directive? - - No - Patient declined No - patient declined information No - patient declined information Yes - Educational materials given    Tobacco Social History   Tobacco Use  Smoking Status Former Smoker  . Packs/day: 1.00  . Years: 30.00  . Pack years: 30.00  . Types: Cigarettes  . Quit date: 10/15/1996  . Years since  quitting: 22.8  Smokeless Tobacco Never Used     Counseling given: Not Answered   Clinical Intake:  Pre-visit preparation completed: Yes  Pain : No/denies pain Pain Score: 0-No pain     Nutritional Risks: None Diabetes: No  How often do you need to have someone help you when you read instructions, pamphlets, or other written materials from your doctor or pharmacy?: 1 - Never  Interpreter Needed?: No  Information entered by :: [facility], LPN  Past Medical History:  Diagnosis Date  . Anxiety   . Arthritis   . CAD (coronary artery disease)   . Cancer ([place])    SKIN  . Colon polyps   . COPD (chronic obstructive pulmonary disease) ([place])   . Depression   . Depression   . Fibrocystic breast disease   . GERD (gastroesophageal reflux disease)   . Heart murmur   . High cholesterol   . Hyperlipidemia   . Hypertension   . IBS (irritable bowel syndrome)   . IBS (irritable bowel syndrome)   . Obesity   . Osteoporosis   . Overactive bladder   . Sleep apnea   . Sleep apnea    Past Surgical History:  Procedure Laterality Date  . ABDOMINAL HYSTERECTOMY     total  . APPENDECTOMY    . BREAST EXCISIONAL BIOPSY Right YRS AGO   NEG  . CATARACT EXTRACTION W/PHACO Right 02/01/2016   Procedure: CATARACT EXTRACTION PHACO AND INTRAOCULAR LENS PLACEMENT (IOC);  Surgeon: Birder Robson, MD;  Location: [facility] ORS;  Service: Ophthalmology;  Laterality: Right;  [place]  00:37.2 AP%  20.7% CDE  7.69 fluid pack lot # [id number]  . CATARACT EXTRACTION W/PHACO Left 02/22/2016   Procedure: CATARACT EXTRACTION PHACO AND INTRAOCULAR LENS PLACEMENT (IOC);  Surgeon: Birder Robson, MD;  Location: [facility] ORS;  Service: Ophthalmology;  Laterality: Left;  [place] 00:40 AP% 19.9 CDE 8.06 fluid pack lot # [id number]  . CHOLECYSTECTOMY    . COLONOSCOPY WITH PROPOFOL N/A 01/13/2016   Procedure: COLONOSCOPY WITH PROPOFOL;  Surgeon: Manya Silvas, MD;  [facility] END[facility]COPY;  Service: Endoscopy;  Laterality:  N/A;  . FOOT SURGERY Left    for breakdown of arch  . OVARIAN CYST SURGERY    . SKIN CANCER EXCISION Right 05/16/2017   Right [place]   Family History  Problem Relation Age of Onset  . Colon cancer Mother        thought to be mets from breast cancer  . Stroke Mother   . Hypertension Mother   . Heart attack Mother   . Breast cancer Mother 46  . Emphysema Father   . Prostate cancer Father   . Throat cancer Daughter   . Breast cancer Maternal Aunt        70's  . Rheum arthritis Daughter   . Hypertension Daughter   . Reye's syndrome Daughter        history of  . Hepatitis C Daughter   . Ovarian cancer Neg Hx    Social History   Socioeconomic History  . Marital status: Married    Spouse name: Orpah Cobb. [place]  . Number of children: 2  . Years of education: 2  . Highest education level: High school graduate  Occupational History  . Occupation: Retired    Comment: book-keeper  Social Needs  . Financial resource strain: Not hard at all  . Food insecurity    Worry: Never true    Inability: Never true  . Transportation needs    Medical: No    Non-medical: No  Tobacco Use  . Smoking status: Former Smoker    Packs/day: 1.00    Years: 30.00    Pack years: 30.00    Types: Cigarettes    Quit date: 10/15/1996    Years since quitting: 22.8  . Smokeless tobacco: Never Used  Substance and Sexual Activity  . Alcohol use: Yes    Comment: wine rarely  . Drug use: No  . Sexual activity: Not Currently    Partners: Male    Birth control/protection: Surgical  Lifestyle  . Physical activity    Days per week: 0 days    Minutes per session: 0 min  . Stress: To some extent  Relationships  . Social Herbalist on phone: Patient refused    Gets together: Patient refused    Attends religious service: Patient refused    Active member of club or organization: Patient refused    Attends meetings of clubs or organizations: Patient refused    Relationship status: Patient  refused  Other Topics Concern  . Not on file  Social History Narrative   Pt states she has raised her granddaughter, and considers the granddaughter her child.    Outpatient Encounter Medications as of 08/13/2019  Medication Sig  . aspirin EC 81 MG tablet Take 81 mg by mouth daily.  Marland Kitchen buPROPion (WELLBUTRIN XL) 300 MG 24 hr tablet Take 1 tablet (300 mg total) by mouth daily.  . calcium carbonate (TUMS - DOSED IN MG ELEMENTAL CALCIUM) 500 MG chewable tablet Chew 1  tablet by mouth 3 (three) times daily with meals.  . cholecalciferol (VITAMIN D) 1000 units tablet Take 1,000 Units by mouth daily.  . colestipol (COLESTID) 1 g tablet Take 1 g by mouth daily.   . DULoxetine (CYMBALTA) 60 MG capsule TAKE 1 CAPSULE BY MOUTH  DAILY  . Fluticasone-Umeclidin-Vilant (TRELEGY ELLIPTA) 100-62.5-25 MCG/INH AEPB Inhale 1 puff into the lungs daily.   . hydrochlorothiazide (HYDRODIURIL) 25 MG tablet Take 1 tablet (25 mg total) by mouth daily.  Marland Kitchen losartan (COZAAR) 50 MG tablet Take 1 tablet (50 mg total) by mouth daily.  . meclizine (ANTIVERT) 25 MG tablet Take 25 mg by mouth 3 (three) times daily as needed for dizziness.  . rosuvastatin (CRESTOR) 5 MG tablet Take 5 mg by mouth 3 (three) times a week.  . traZODone (DESYREL) 50 MG tablet Take 1 tablet (50 mg total) by mouth at bedtime.  . benzonatate (TESSALON PERLES) 100 MG capsule Take 1 capsule (100 mg total) by mouth 3 (three) times daily as needed. (Patient not taking: Reported on 08/13/2019)  . doxycycline (VIBRA-TABS) 100 MG tablet Take 1 tablet (100 mg total) by mouth 2 (two) times daily. 1 po bid (Patient not taking: Reported on 08/13/2019)  . ranitidine (ZANTAC) 150 MG tablet Take 150 mg by mouth at bedtime.    No facility-administered encounter medications on file as of 08/13/2019.     Activities of Daily Living In your present state of health, do you have any difficulty performing the following activities: 08/13/2019  Hearing? N  Vision? N   Comment Wears eye glasss daily.  Difficulty concentrating or making decisions? N  Walking or climbing stairs? Y  Comment Due to knee issues.  Dressing or bathing? N  Doing errands, shopping? N  Preparing Food and eating ? N  Using the Toilet? N  In the past six months, have you accidently leaked urine? Y  Comment daily- wears protection at all times  Do you have problems with loss of bowel control? Y  Comment occasionally  Managing your Medications? N  Managing your Finances? N  Housekeeping or managing your Housekeeping? N  Some recent data might be hidden    Patient Care Team: Virginia Crews, MD as PCP - General (Family Medicine) Birder Robson, MD as Referring Physician (Ophthalmology) Isaias Cowman, MD as Consulting Physician (Cardiology) Ottie Glazier, MD as Consulting Physician (Pulmonary Disease)    Assessment:   This is a routine wellness examination for Marcia Livingston.  Exercise Activities and Dietary recommendations Current Exercise Habits: The patient does not participate in regular exercise at present, Exercise limited by: None identified  Goals    . Exercise 3x per week (30 min per time)     Recommend to start walking 3 days a week for at least 30 minutes at a time.     . Prevent falls     Recommend to remove any items from the home that may cause slips or trips.       Fall Risk: Fall Risk  08/13/2019 08/13/2019 08/09/2018 01/16/2018  Falls in the past year? 1 1 No No  Number falls in past yr: 0 0 - -  Injury with Fall? 0 0 - -  Follow up - Falls prevention discussed - -    FALL RISK PREVENTION PERTAINING TO THE HOME:  Any stairs in or around the home? Yes  If so, are there any without handrails? No   Home free of loose throw rugs in walkways, pet beds, electrical cords,  etc? Yes  Adequate lighting in your home to reduce risk of falls? Yes   ASSISTIVE DEVICES UTILIZED TO PREVENT FALLS:  Life alert? No  Use of a cane, walker or w/c? No   Grab bars in the bathroom? No  Shower chair or bench in shower? Yes  Elevated toilet seat or a handicapped toilet? No    TIMED UP AND GO:  Was the test performed? No .    Depression Screen PHQ 2/9 Scores 08/13/2019 03/17/2019 02/14/2019 08/09/2018  PHQ - 2 Score 0 0 4 1  PHQ- 9 Score - - 14 -     Cognitive Function: Declined today.         Immunization History  Administered Date(s) Administered  . Influenza, High Dose Seasonal PF 07/26/2017, 08/12/2019  . Influenza,inj,Quad PF,6+ Mos 07/21/2015  . Influenza-Unspecified 10/24/2012, 08/14/2013, 08/20/2014, 07/21/2015, 07/20/2016, 07/30/2017, 08/08/2018  . Pneumococcal Conjugate-13 07/21/2015  . Pneumococcal Polysaccharide-23 11/26/2009, 01/30/2017  . Tdap 01/30/2017  . Zoster 10/21/2013    Qualifies for Shingles Vaccine? Yes  Zostavax completed 10/31/13. Due for Shingrix. Education has been provided regarding the importance of this vaccine. Pt has been advised to call insurance company to determine out of pocket expense. Advised may also receive vaccine at local pharmacy or Health Dept. Verbalized acceptance and understanding.  Tdap: Up to date  Flu Vaccine: Up to date  Pneumococcal Vaccine: Completed series  Screening Tests Health Maintenance  Topic Date Due  . COLONOSCOPY  01/12/2021  . MAMMOGRAM  06/11/2021  . DEXA SCAN  02/28/2022  . TETANUS/TDAP  01/31/2027  . INFLUENZA VACCINE  Completed  . Hepatitis C Screening  Completed  . PNA vac Low Risk Adult  Completed    Cancer Screenings:  Colorectal Screening: Completed 01/13/16. Repeat every 5 years.  Mammogram: Completed 06/12/19.   Bone Density: Completed 02/28/17. Results reflect OSTEOPENIA. Repeat every 5 years.   Lung Cancer Screening: (Low Dose CT Chest recommended if Age 17-80 years, 30 pack-year currently smoking OR have quit w/in 15years.) does not qualify.   Additional Screening:  Hepatitis C Screening: Up to date  Vision Screening: Recommended  annual ophthalmology exams for early detection of glaucoma and other disorders of the eye.  Dental Screening: Recommended annual dental exams for proper oral hygiene  Community Resource Referral:  CRR required this visit?  No       Plan:  I have personally reviewed and addressed the Medicare Annual Wellness questionnaire and have noted the following in the patient's chart:  A. Medical and social history B. Use of alcohol, tobacco or illicit drugs  C. Current medications and supplements D. Functional ability and status E.  Nutritional status F.  Physical activity G. Advance directives H. List of other physicians I.  Hospitalizations, surgeries, and ER visits in previous 12 months J.  [place] such as hearing and vision if needed, cognitive and depression L. Referrals and appointments   In addition, I have reviewed and discussed with patient certain preventive protocols, quality metrics, and best practice recommendations. A written personalized care plan for preventive services as well as general preventive health recommendations were provided to patient. Nurse Health Advisor  Signed,    Camden Knotek [place], [place]  [id number] Nurse Health Advisor   Nurse Notes: None.

## 2019-08-13 ENCOUNTER — Ambulatory Visit (INDEPENDENT_AMBULATORY_CARE_PROVIDER_SITE_OTHER): Payer: Medicare Other

## 2019-08-13 ENCOUNTER — Other Ambulatory Visit: Payer: Self-pay

## 2019-08-13 DIAGNOSIS — Z0000 Encounter for general adult medical examination without abnormal findings: Secondary | ICD-10-CM

## 2019-08-13 NOTE — Patient Instructions (Addendum)
Marcia Livingston , Thank you for taking time to come for your Medicare Wellness Visit. I appreciate your ongoing commitment to your health goals. Please review the following plan we discussed and let me know if I can assist you in the future.   Screening recommendations/referrals: Colonoscopy: Up to date, due 12/2020 Mammogram: Up to date, due 05/2021 Bone Density: Up to date, due 02/2022 Recommended yearly ophthalmology/optometry visit for glaucoma screening and checkup Recommended yearly dental visit for hygiene and checkup  Vaccinations: Influenza vaccine: Up to date Pneumococcal vaccine: Completed series Tdap vaccine: Up to date, due 01/2027 Shingles vaccine: Pt declines today.     Advanced directives: Please bring a copy of your POA (Power of Attorney) and/or Living Will to your next appointment.   Conditions/risks identified: Fall risk prevention discussed today. Continue to try and walk 3 days a week for at least 30 minutes at a time.   Next appointment: 08/15/19 @ 10:00 AM with Dr Brita Romp. Declines scheduling an AWV for 2021 at this time.    Preventive Care 29 Years and Older, Female Preventive care refers to lifestyle choices and visits with your health care provider that can promote health and wellness. What does preventive care include?  A yearly physical exam. This is also called an annual well check.  Dental exams once or twice a year.  Routine eye exams. Ask your health care provider how often you should have your eyes checked.  Personal lifestyle choices, including:  Daily care of your teeth and gums.  Regular physical activity.  Eating a healthy diet.  Avoiding tobacco and drug use.  Limiting alcohol use.  Practicing safe sex.  Taking low-dose aspirin every day.  Taking vitamin and mineral supplements as recommended by your health care provider. What happens during an annual well check? The services and screenings done by your health care provider during your  annual well check will depend on your age, overall health, lifestyle risk factors, and family history of disease. Counseling  Your health care provider may ask you questions about your:  Alcohol use.  Tobacco use.  Drug use.  Emotional well-being.  Home and relationship well-being.  Sexual activity.  Eating habits.  History of falls.  Memory and ability to understand (cognition).  Work and work Statistician.  Reproductive health. Screening  You may have the following tests or measurements:  Height, weight, and BMI.  Blood pressure.  Lipid and cholesterol levels. These may be checked every 5 years, or more frequently if you are over 35 years old.  Skin check.  Lung cancer screening. You may have this screening every year starting at age 11 if you have a 30-pack-year history of smoking and currently smoke or have quit within the past 15 years.  Fecal occult blood test (FOBT) of the stool. You may have this test every year starting at age 44.  Flexible sigmoidoscopy or colonoscopy. You may have a sigmoidoscopy every 5 years or a colonoscopy every 10 years starting at age 64.  Hepatitis C blood test.  Hepatitis B blood test.  Sexually transmitted disease (STD) testing.  Diabetes screening. This is done by checking your blood sugar (glucose) after you have not eaten for a while (fasting). You may have this done every 1-3 years.  Bone density scan. This is done to screen for osteoporosis. You may have this done starting at age 4.  Mammogram. This may be done every 1-2 years. Talk to your health care provider about how often you should have  regular mammograms. Talk with your health care provider about your test results, treatment options, and if necessary, the need for more tests. Vaccines  Your health care provider may recommend certain vaccines, such as:  Influenza vaccine. This is recommended every year.  Tetanus, diphtheria, and acellular pertussis (Tdap, Td)  vaccine. You may need a Td booster every 10 years.  Zoster vaccine. You may need this after age 11.  Pneumococcal 13-valent conjugate (PCV13) vaccine. One dose is recommended after age 56.  Pneumococcal polysaccharide (PPSV23) vaccine. One dose is recommended after age 75. Talk to your health care provider about which screenings and vaccines you need and how often you need them. This information is not intended to replace advice given to you by your health care provider. Make sure you discuss any questions you have with your health care provider. Document Released: 10/29/2015 Document Revised: 06/21/2016 Document Reviewed: 08/03/2015 Elsevier Interactive Patient Education  2017 [place] Prevention in the Home Falls can cause injuries. They can happen to people of all ages. There are many things you can do to make your home safe and to help prevent falls. What can I do on the outside of my home?  Regularly fix the edges of walkways and driveways and fix any cracks.  Remove anything that might make you trip as you walk through a door, such as a raised step or threshold.  Trim any bushes or trees on the path to your home.  Use bright outdoor lighting.  Clear any walking paths of anything that might make someone trip, such as rocks or tools.  Regularly check to see if handrails are loose or broken. Make sure that both sides of any steps have handrails.  Any raised decks and porches should have guardrails on the edges.  Have any leaves, snow, or ice cleared regularly.  Use sand or salt on walking paths during winter.  Clean up any spills in your garage right away. This includes oil or grease spills. What can I do in the bathroom?  Use night lights.  Install grab bars by the toilet and in the tub and shower. Do not use towel bars as grab bars.  Use non-skid mats or decals in the tub or shower.  If you need to sit down in the shower, use a plastic, non-slip stool.   Keep the floor dry. Clean up any water that spills on the floor as soon as it happens.  Remove soap buildup in the tub or shower regularly.  Attach bath mats securely with double-sided non-slip rug tape.  Do not have throw rugs and other things on the floor that can make you trip. What can I do in the bedroom?  Use night lights.  Make sure that you have a light by your bed that is easy to reach.  Do not use any sheets or blankets that are too big for your bed. They should not hang down onto the floor.  Have a firm chair that has side arms. You can use this for support while you get dressed.  Do not have throw rugs and other things on the floor that can make you trip. What can I do in the kitchen?  Clean up any spills right away.  Avoid walking on wet floors.  Keep items that you use a lot in easy-to-reach places.  If you need to reach something above you, use a strong step stool that has a grab bar.  Keep electrical cords out of the  way.  Do not use floor polish or wax that makes floors slippery. If you must use wax, use non-skid floor wax.  Do not have throw rugs and other things on the floor that can make you trip. What can I do with my stairs?  Do not leave any items on the stairs.  Make sure that there are handrails on both sides of the stairs and use them. Fix handrails that are broken or loose. Make sure that handrails are as long as the stairways.  Check any carpeting to make sure that it is firmly attached to the stairs. Fix any carpet that is loose or worn.  Avoid having throw rugs at the top or bottom of the stairs. If you do have throw rugs, attach them to the floor with carpet tape.  Make sure that you have a light switch at the top of the stairs and the bottom of the stairs. If you do not have them, ask someone to add them for you. What else can I do to help prevent falls?  Wear shoes that:  Do not have high heels.  Have rubber bottoms.  Are  comfortable and fit you well.  Are closed at the toe. Do not wear sandals.  If you use a stepladder:  Make sure that it is fully opened. Do not climb a closed stepladder.  Make sure that both sides of the stepladder are locked into place.  Ask someone to hold it for you, if possible.  Clearly mark and make sure that you can see:  Any grab bars or handrails.  First and last steps.  Where the edge of each step is.  Use tools that help you move around (mobility aids) if they are needed. These include:  Canes.  Walkers.  Scooters.  Crutches.  Turn on the lights when you go into a dark area. Replace any light bulbs as soon as they burn out.  Set up your furniture so you have a clear path. Avoid moving your furniture around.  If any of your floors are uneven, fix them.  If there are any pets around you, be aware of where they are.  Review your medicines with your doctor. Some medicines can make you feel dizzy. This can increase your chance of falling. Ask your doctor what other things that you can do to help prevent falls. This information is not intended to replace advice given to you by your health care provider. Make sure you discuss any questions you have with your health care provider. Document Released: 07/29/2009 Document Revised: 03/09/2016 Document Reviewed: 11/06/2014 Elsevier Interactive Patient Education  2017 [employer].

## 2019-08-15 ENCOUNTER — Other Ambulatory Visit: Payer: Self-pay

## 2019-08-15 ENCOUNTER — Encounter: Payer: Self-pay | Admitting: Family Medicine

## 2019-08-15 ENCOUNTER — Ambulatory Visit (INDEPENDENT_AMBULATORY_CARE_PROVIDER_SITE_OTHER): Payer: Medicare Other | Admitting: Family Medicine

## 2019-08-15 ENCOUNTER — Ambulatory Visit: Payer: Medicare Other

## 2019-08-15 VITALS — BP 137/73 | HR 65 | Temp 96.9°F | Resp 16 | Ht 61.0 in | Wt 211.0 lb

## 2019-08-15 DIAGNOSIS — D72829 Elevated white blood cell count, unspecified: Secondary | ICD-10-CM | POA: Diagnosis not present

## 2019-08-15 DIAGNOSIS — F419 Anxiety disorder, unspecified: Secondary | ICD-10-CM

## 2019-08-15 DIAGNOSIS — F3342 Major depressive disorder, recurrent, in full remission: Secondary | ICD-10-CM | POA: Diagnosis not present

## 2019-08-15 DIAGNOSIS — I10 Essential (primary) hypertension: Secondary | ICD-10-CM

## 2019-08-15 DIAGNOSIS — I209 Angina pectoris, unspecified: Secondary | ICD-10-CM | POA: Diagnosis not present

## 2019-08-15 DIAGNOSIS — R7303 Prediabetes: Secondary | ICD-10-CM | POA: Diagnosis not present

## 2019-08-15 DIAGNOSIS — E7800 Pure hypercholesterolemia, unspecified: Secondary | ICD-10-CM

## 2019-08-15 NOTE — Assessment & Plan Note (Signed)
Well controlled previously Continue Crestor 3 times weekly Recheck FLP, CMP

## 2019-08-15 NOTE — Assessment & Plan Note (Signed)
Well controlled In remission Continue current medications F/u in 6 months

## 2019-08-15 NOTE — Patient Instructions (Signed)
Preventive Care 74 Years and Older, Female Preventive care refers to lifestyle choices and visits with your health care provider that can promote health and wellness. This includes:  A yearly physical exam. This is also called an annual well check.  Regular dental and eye exams.  Immunizations.  Screening for certain conditions.  Healthy lifestyle choices, such as diet and exercise. What can I expect for my preventive care visit? Physical exam Your health care provider will check:  Height and weight. These may be used to calculate body mass index (BMI), which is a measurement that tells if you are at a healthy weight.  Heart rate and blood pressure.  Your skin for abnormal spots. Counseling Your health care provider may ask you questions about:  Alcohol, tobacco, and drug use.  Emotional well-being.  Home and relationship well-being.  Sexual activity.  Eating habits.  History of falls.  Memory and ability to understand (cognition).  Work and work Statistician.  Pregnancy and menstrual history. What immunizations do I need?  Influenza (flu) vaccine  This is recommended every year. Tetanus, diphtheria, and pertussis (Tdap) vaccine  You may need a Td booster every 10 years. Varicella (chickenpox) vaccine  You may need this vaccine if you have not already been vaccinated. Zoster (shingles) vaccine  You may need this after age 74. Pneumococcal conjugate (PCV13) vaccine  One dose is recommended after age 74. Pneumococcal polysaccharide (PPSV23) vaccine  One dose is recommended after age 72. Measles, mumps, and rubella (MMR) vaccine  You may need at least one dose of MMR if you were born in 1957 or later. You may also need a second dose. Meningococcal conjugate (MenACWY) vaccine  You may need this if you have certain conditions. Hepatitis A vaccine  You may need this if you have certain conditions or if you travel or work in places where you may be exposed  to hepatitis A. Hepatitis B vaccine  You may need this if you have certain conditions or if you travel or work in places where you may be exposed to hepatitis B. Haemophilus influenzae type b (Hib) vaccine  You may need this if you have certain conditions. You may receive vaccines as individual doses or as more than one vaccine together in one shot (combination vaccines). Talk with your health care provider about the risks and benefits of combination vaccines. What tests do I need? Blood tests  Lipid and cholesterol levels. These may be checked every 5 years, or more frequently depending on your overall health.  Hepatitis C test.  Hepatitis B test. Screening  Lung cancer screening. You may have this screening every year starting at age 74 if you have a 30-pack-year history of smoking and currently smoke or have quit within the past 15 years.  Colorectal cancer screening. All adults should have this screening starting at age 74 and continuing until age 15. Your health care provider may recommend screening at age 74 if you are at increased risk. if you are at increased risk. You will have tests every 1-10 years, depending on your results and the type of screening test.  Diabetes screening. This is done by checking your blood sugar (glucose) after you have not eaten for a while (fasting). You may have this done every 1-3 years.  Mammogram. This may be done every 1-2 years. Talk with your health care provider about how often you should have regular mammograms.  BRCA-related cancer screening. This may be done if you have a family history of breast, ovarian, tubal, or peritoneal cancers.  Other tests  Sexually transmitted disease (STD) testing.  Bone density scan. This is done to screen for osteoporosis. You may have this done starting at age 55. Follow these instructions at home: Eating and drinking  Eat a diet that includes fresh fruits and vegetables, whole grains, lean protein, and low-fat dairy products. Limit  your intake of foods with high amounts of sugar, saturated fats, and salt.  Take vitamin and mineral supplements as recommended by your health care provider.  Do not drink alcohol if your health care provider tells you not to drink.  If you drink alcohol: ? Limit how much you have to 0-1 drink a day. ? Be aware of how much alcohol is in your drink. In the [place] one drink equals one 12 oz bottle of beer (355 mL), one 5 oz glass of wine (148 mL), or one 1 oz glass of hard liquor (44 mL). Lifestyle  Take daily care of your teeth and gums.  Stay active. Exercise for at least 30 minutes on 5 or more days each week.  Do not use any products that contain nicotine or tobacco, such as cigarettes, e-cigarettes, and chewing tobacco. If you need help quitting, ask your health care provider.  If you are sexually active, practice safe sex. Use a condom or other form of protection in order to prevent STIs (sexually transmitted infections).  Talk with your health care provider about taking a low-dose aspirin or statin. What's next?  Go to your health care provider once a year for a well check visit.  Ask your health care provider how often you should have your eyes and teeth checked.  Stay up to date on all vaccines. This information is not intended to replace advice given to you by your health care provider. Make sure you discuss any questions you have with your health care provider. Document Released: 10/29/2015 Document Revised: 09/26/2018 Document Reviewed: 09/26/2018 Elsevier Patient Education  2020 [employer].

## 2019-08-15 NOTE — Assessment & Plan Note (Signed)
Discussed low carb diet Repeat A1c

## 2019-08-15 NOTE — Progress Notes (Addendum)
Patient: Marcia Livingston, Female    DOB: 01-10-1946, 73 y.o.   MRN: [id number] Visit Date: 08/15/2019  Today's Provider: Lavon Paganini, MD   Chief Complaint  Patient presents with  . Hypertension   Subjective:      Hypertension, follow-up:  BP Readings from Last 3 Encounters:  08/15/19 137/73  03/17/19 129/74  02/14/19 (!) 156/84    She was last seen for hypertension 4 months ago.  BP at that visit was 129/74. Management changes since that visit include no changes. She reports excellent compliance with treatment. She is not having side effects.  She is not exercising. She is adherent to low salt diet.   Outside blood pressures are stable. She is experiencing none.  Patient denies chest pain.   Cardiovascular risk factors include hypertension and obesity (BMI >= 30 kg/m2).  Use of agents associated with hypertension: none.     Weight trend: stable Wt Readings from Last 3 Encounters:  08/15/19 211 lb (95.7 kg)  08/09/18 217 lb 3.2 oz (98.5 kg)  08/09/18 217 lb 3.2 oz (98.5 kg)    Current diet: not asked  ------------------------------------------------------------------------  Marcia Livingston is a 73 y.o. female. She feels well. She reports exercising no. She reports she is sleeping well. 08/13/2019 AWE with M[facility] 06/12/2019 Mammogram-BI-RADS 1 02/28/2017 BMD-Osteopenia 01/13/2016 Colonoscopy-polyps, internal hemorrhoids, Tubular adenomas -----------------------------------------------------------   Depression screen [facility] 2/9 08/15/2019 08/13/2019 03/17/2019 02/14/2019 08/09/2018  Decreased Interest 0 0 0 2 1  Down, Depressed, Hopeless 0 0 0 2 0  PHQ - 2 Score 0 0 0 4 1  Altered sleeping 0 - - 2 -  Tired, decreased energy 0 - - 2 -  Change in appetite 0 - - 2 -  Feeling bad or failure about yourself  0 - - 1 -  Trouble concentrating 0 - - 3 -  Moving slowly or fidgety/restless 0 - - 0 -  Suicidal thoughts 0 - - 0 -  PHQ-9 Score 0 - - 14 -  Difficult  doing work/chores Not difficult at all - - Somewhat difficult -     GAD 7 : Generalized Anxiety Score 03/17/2019 02/14/2019 01/16/2018  Nervous, Anxious, on Edge 0 1 2  Control/stop worrying 0 2 0  Worry too much - different things 1 2 3   Trouble relaxing 0 0 3  Restless 0 2 2  Easily annoyed or irritable 0 2 3  Afraid - awful might happen 0 3 1  Total GAD 7 Score 1 12 14   Anxiety Difficulty Not difficult at all Somewhat difficult Somewhat difficult     Review of Systems  Constitutional: Negative.   HENT: Negative.   Eyes: Negative.   Respiratory: Negative.   Cardiovascular: Negative.   Gastrointestinal: Negative.   Endocrine: Negative.   Genitourinary: Negative.   Musculoskeletal: Negative.   Skin: Negative.   Allergic/Immunologic: Negative.   Neurological: Negative.   Hematological: Negative.   Psychiatric/Behavioral: Negative.     Social History   Socioeconomic History  . Marital status: Married    Spouse name: Orpah Cobb. Diefenderfer  . Number of children: 2  . Years of education: 78  . Highest education level: High school graduate  Occupational History  . Occupation: Retired    Comment: book-keeper  Social Needs  . Financial resource strain: Not hard at all  . Food insecurity    Worry: Never true    Inability: Never true  . Transportation needs    Medical:  No    Non-medical: No  Tobacco Use  . Smoking status: Former Smoker    Packs/day: 1.00    Years: 30.00    Pack years: 30.00    Types: Cigarettes    Quit date: 10/15/1996    Years since quitting: 22.8  . Smokeless tobacco: Never Used  Substance and Sexual Activity  . Alcohol use: Yes    Comment: wine rarely  . Drug use: No  . Sexual activity: Not Currently    Partners: Male    Birth control/protection: Surgical  Lifestyle  . Physical activity    Days per week: 0 days    Minutes per session: 0 min  . Stress: To some extent  Relationships  . Social Herbalist on phone: Patient refused    Gets  together: Patient refused    Attends religious service: Patient refused    Active member of club or organization: Patient refused    Attends meetings of clubs or organizations: Patient refused    Relationship status: Patient refused  . Intimate partner violence    Fear of current or ex partner: Patient refused    Emotionally abused: Patient refused    Physically abused: Patient refused    Forced sexual activity: Patient refused  Other Topics Concern  . Not on file  Social History Narrative   Pt states she has raised her granddaughter, and considers the granddaughter her child.    Past Medical History:  Diagnosis Date  . Anxiety   . Arthritis   . CAD (coronary artery disease)   . Cancer ([place])    SKIN  . Colon polyps   . COPD (chronic obstructive pulmonary disease) ([place])   . Depression   . Depression   . Fibrocystic breast disease   . GERD (gastroesophageal reflux disease)   . Heart murmur   . High cholesterol   . Hyperlipidemia   . Hypertension   . IBS (irritable bowel syndrome)   . IBS (irritable bowel syndrome)   . Obesity   . Osteoporosis   . Overactive bladder   . Sleep apnea   . Sleep apnea      Patient Active Problem List   Diagnosis Date Noted  . Prediabetes 02/14/2019  . Allergic rhinitis 07/04/2018  . OSA (obstructive sleep apnea) 01/16/2018  . GERD (gastroesophageal reflux disease) 01/16/2018  . Insomnia 01/16/2018  . Mild aortic stenosis 01/16/2018  . Angina pectoris ([place]) 10/22/2015  . HTN (hypertension) 10/22/2015  . High cholesterol 10/22/2015  . Anxiety 10/22/2015  . Depression 10/22/2015  . CAD (coronary artery disease) 10/22/2015    Past Surgical History:  Procedure Laterality Date  . ABDOMINAL HYSTERECTOMY     total  . APPENDECTOMY    . BREAST EXCISIONAL BIOPSY Right YRS AGO   NEG  . CATARACT EXTRACTION W/PHACO Right 02/01/2016   Procedure: CATARACT EXTRACTION PHACO AND INTRAOCULAR LENS PLACEMENT (IOC);  Surgeon: Birder Robson, MD;   Location: [facility] ORS;  Service: Ophthalmology;  Laterality: Right;  [place]    00:37.2 AP%  20.7% CDE  7.69 fluid pack lot # [id number]  . CATARACT EXTRACTION W/PHACO Left 02/22/2016   Procedure: CATARACT EXTRACTION PHACO AND INTRAOCULAR LENS PLACEMENT (IOC);  Surgeon: Birder Robson, MD;  Location: [facility] ORS;  Service: Ophthalmology;  Laterality: Left;  [place] 00:40 AP% 19.9 CDE 8.06 fluid pack lot # [id number]  . CHOLECYSTECTOMY    . COLONOSCOPY WITH PROPOFOL N/A 01/13/2016   Procedure: COLONOSCOPY WITH PROPOFOL;  Surgeon: Manya Silvas, MD;  Location: [facility] ENDOSCOPY;  Service: Endoscopy;  Laterality: N/A;  . FOOT SURGERY Left    for breakdown of arch  . OVARIAN CYST SURGERY    . SKIN CANCER EXCISION Right 05/16/2017   Right [place]    Her family history includes Breast cancer in her maternal aunt; Breast cancer (age of onset: 13) in her mother; Colon cancer in her mother; Emphysema in her father; Heart attack in her mother; Hepatitis C in her daughter; Hypertension in her daughter and mother; Prostate cancer in her father; Reye's syndrome in her daughter; Rheum arthritis in her daughter; Stroke in her mother; Throat cancer in her daughter. There is no history of Ovarian cancer.   Current Outpatient Medications:  .  aspirin EC 81 MG tablet, Take 81 mg by mouth daily., Disp: , Rfl:  .  buPROPion (WELLBUTRIN XL) 300 MG 24 hr tablet, Take 1 tablet (300 mg total) by mouth daily., Disp: 90 tablet, Rfl: 1 .  calcium carbonate (TUMS - DOSED IN MG ELEMENTAL CALCIUM) 500 MG chewable tablet, Chew 1 tablet by mouth 3 (three) times daily with meals., Disp: , Rfl:  .  cholecalciferol (VITAMIN D) 1000 units tablet, Take 1,000 Units by mouth daily., Disp: , Rfl:  .  colestipol (COLESTID) 1 g tablet, Take 1 g by mouth daily. , Disp: , Rfl:  .  DULoxetine (CYMBALTA) 60 MG capsule, TAKE 1 CAPSULE BY MOUTH  DAILY, Disp: 90 capsule, Rfl: 3 .  Fluticasone-Umeclidin-Vilant (TRELEGY ELLIPTA) 100-62.5-25 MCG/INH AEPB, Inhale  1 puff into the lungs daily. , Disp: , Rfl:  .  hydrochlorothiazide (HYDRODIURIL) 25 MG tablet, Take 1 tablet (25 mg total) by mouth daily., Disp: 90 tablet, Rfl: 1 .  losartan (COZAAR) 50 MG tablet, Take 1 tablet (50 mg total) by mouth daily., Disp: 90 tablet, Rfl: 1 .  meclizine (ANTIVERT) 25 MG tablet, Take 25 mg by mouth 3 (three) times daily as needed for dizziness., Disp: , Rfl:  .  ranitidine (ZANTAC) 150 MG tablet, Take 150 mg by mouth at bedtime. , Disp: , Rfl:  .  rosuvastatin (CRESTOR) 5 MG tablet, Take 5 mg by mouth 3 (three) times a week., Disp: , Rfl:  .  traZODone (DESYREL) 50 MG tablet, Take 1 tablet (50 mg total) by mouth at bedtime., Disp: 90 tablet, Rfl: 3  Patient Care Team: Bacigalupo, Dionne Bucy, MD as PCP - General (Family Medicine) Birder Robson, MD as Referring Physician (Ophthalmology) Isaias Cowman, MD as Consulting Physician (Cardiology) Ottie Glazier, MD as Consulting Physician (Pulmonary Disease)     Objective:    Vitals: BP 137/73 (BP Location: Left Arm, Patient Position: Sitting, Cuff Size: Large)   Pulse 65   Temp (!) 96.9 F (36.1 C) (Temporal)   Resp 16   Ht 5\' 1"  (1.549 m)   Wt 211 lb (95.7 kg)   BMI 39.87 kg/m   Physical Exam Vitals signs reviewed.  Constitutional:      General: She is not in acute distress.    Appearance: Normal appearance. She is well-developed. She is not diaphoretic.  HENT:     Head: Normocephalic and atraumatic.     Right Ear: Tympanic membrane, ear canal and external ear normal.     Left Ear: Tympanic membrane, ear canal and external ear normal.  Eyes:     General: No scleral icterus.    Conjunctiva/sclera: Conjunctivae normal.     Pupils: Pupils are equal, round, and reactive to light.  Neck:     Musculoskeletal: Neck  supple.     Thyroid: No thyromegaly.  Cardiovascular:     Rate and Rhythm: Normal rate and regular rhythm.     Pulses: Normal pulses.     Heart sounds: Murmur present.  Pulmonary:      Effort: Pulmonary effort is normal. No respiratory distress.     Breath sounds: Normal breath sounds. No wheezing, rhonchi or rales.  Abdominal:     General: There is no distension.     Palpations: Abdomen is soft.     Tenderness: There is no abdominal tenderness.  Musculoskeletal:        General: No deformity.     Right lower leg: No edema.     Left lower leg: No edema.  Lymphadenopathy:     Cervical: No cervical adenopathy.  Skin:    General: Skin is warm and dry.     Capillary Refill: Capillary refill takes less than 2 seconds.     Findings: No rash.  Neurological:     Mental Status: She is alert and oriented to person, place, and time. Mental status is at baseline.  Psychiatric:        Mood and Affect: Mood normal.        Behavior: Behavior normal.        Thought Content: Thought content normal.     Activities of Daily Living In your present state of health, do you have any difficulty performing the following activities: 08/13/2019  Hearing? N  Vision? N  Comment Wears eye glasss daily.  Difficulty concentrating or making decisions? N  Walking or climbing stairs? Y  Comment Due to knee issues.  Dressing or bathing? N  Doing errands, shopping? N  Preparing Food and eating ? N  Using the Toilet? N  In the past six months, have you accidently leaked urine? Y  Comment daily- wears protection at all times  Do you have problems with loss of bowel control? Y  Comment occasionally  Managing your Medications? N  Managing your Finances? N  Housekeeping or managing your Housekeeping? N  Some recent data might be hidden    Fall Risk Assessment Fall Risk  08/13/2019 08/13/2019 08/09/2018 01/16/2018  Falls in the past year? 1 1 No No  Number falls in past yr: 0 0 - -  Injury with Fall? 0 0 - -  Follow up - Falls prevention discussed - -     Depression Screen PHQ 2/9 Scores 08/15/2019 08/13/2019 03/17/2019 02/14/2019  PHQ - 2 Score 0 0 0 4  PHQ- 9 Score 0 - - 14    No  flowsheet data found.     Assessment & Plan:    F/u to [facility] Reviewed patient's Family Medical History Reviewed and updated list of patient's medical providers Assessment of cognitive impairment was done Assessed patient's functional ability Established a written schedule for health screening [place] Completed and Reviewed  Exercise Activities and Dietary recommendations Goals    . Exercise 3x per week (30 min per time)     Recommend to start walking 3 days a week for at least 30 minutes at a time.     . Prevent falls     Recommend to remove any items from the home that may cause slips or trips.       Immunization History  Administered Date(s) Administered  . Influenza, High Dose Seasonal PF 07/26/2017, 08/12/2019  . Influenza,inj,Quad PF,6+ Mos 07/21/2015  . Influenza-Unspecified 10/24/2012, 08/14/2013, 08/20/2014, 07/21/2015, 07/20/2016, 07/30/2017, 08/08/2018  .  Pneumococcal Conjugate-13 07/21/2015  . Pneumococcal Polysaccharide-23 11/26/2009, 01/30/2017  . Tdap 01/30/2017  . Zoster 10/21/2013    Health Maintenance  Topic Date Due  . COLONOSCOPY  01/12/2021  . MAMMOGRAM  06/11/2021  . DEXA SCAN  02/28/2022  . TETANUS/TDAP  01/31/2027  . INFLUENZA VACCINE  Completed  . Hepatitis C Screening  Completed  . PNA vac Low Risk Adult  Completed     Discussed health benefits of physical activity, and encouraged her to engage in regular exercise appropriate for her age and condition.    ------------------------------------------------------------------------------------------------------------  Problem List Items Addressed This Visit      Cardiovascular and Mediastinum   Angina pectoris ([place])    Stable No recent chest pain      HTN (hypertension) - Primary    Well controlled Continue current medications Recheck metabolic panel F/u in 6 months       Relevant Orders   Comprehensive metabolic panel     Other   High cholesterol    Well  controlled previously Continue Crestor 3 times weekly Recheck FLP, CMP      Relevant Orders   Comprehensive metabolic panel   Lipid Panel With LDL/HDL Ratio   Anxiety    Well controlled Off of all benzos Continue Cymbalta and Wellbutrin at current dose Encouraged therapy F/u in 6 months      Depression    Well controlled In remission Continue current medications F/u in 6 months      Prediabetes    Discussed low carb diet Repeat A1c      Relevant Orders   Hemoglobin A1c   Morbid obesity ([place])    Associated with GERD, OA, OSA, prediabetes, HTN, HLD Discussed importance of healthy weight management Discussed diet and exercise       Relevant Orders   CBC with Differential/Platelet   Comprehensive metabolic panel   Lipid Panel With LDL/HDL Ratio   Hemoglobin A1c    Other Visit Diagnoses    Leukocytosis, unspecified type       Relevant Orders   CBC with Differential/Platelet       Return in about 6 months (around 02/13/2020) for chronic disease f/u.   The entirety of the information documented in the History of Present Illness, Review of Systems and Physical Exam were personally obtained by me. Portions of this information were initially documented by Lynford Humphrey , CMA and reviewed by me for thoroughness and accuracy.    Bacigalupo, Dionne Bucy, MD MPH [facility]

## 2019-08-15 NOTE — Assessment & Plan Note (Signed)
Associated with GERD, OA, OSA, prediabetes, HTN, HLD Discussed importance of healthy weight management Discussed diet and exercise

## 2019-08-15 NOTE — Assessment & Plan Note (Signed)
Stable. No recent chest pain.  

## 2019-08-15 NOTE — Assessment & Plan Note (Signed)
Well controlled Off of all benzos Continue Cymbalta and Wellbutrin at current dose Encouraged therapy F/u in 6 months

## 2019-08-15 NOTE — Assessment & Plan Note (Signed)
Well controlled Continue current medications Recheck metabolic panel F/u in 6 months  

## 2019-08-16 LAB — COMPREHENSIVE METABOLIC PANEL
ALT: 21 IU/L (ref 0–32)
AST: 14 IU/L (ref 0–40)
Albumin/Globulin Ratio: 1.6 (ref 1.2–2.2)
Albumin: 3.8 g/dL (ref 3.7–4.7)
Alkaline Phosphatase: 97 IU/L (ref 39–117)
BUN/Creatinine Ratio: 15 (ref 12–28)
BUN: 12 mg/dL (ref 8–27)
Bilirubin Total: 0.3 mg/dL (ref 0.0–1.2)
CO2: 25 mmol/L (ref 20–29)
Calcium: 8.6 mg/dL — ABNORMAL LOW (ref 8.7–10.3)
Chloride: 106 mmol/L (ref 96–106)
Creatinine, Ser: 0.8 mg/dL (ref 0.57–1.00)
GFR calc Af Amer: 84 mL/min/{1.73_m2} (ref >59)
GFR calc non Af Amer: 73 mL/min/{1.73_m2} (ref >59)
Globulin, Total: 2.4 g/dL (ref 1.5–4.5)
Glucose: 82 mg/dL (ref 65–99)
Potassium: 3.7 mmol/L (ref 3.5–5.2)
Sodium: 145 mmol/L — ABNORMAL HIGH (ref 134–144)
Total Protein: 6.2 g/dL (ref 6.0–8.5)

## 2019-08-16 LAB — LIPID PANEL WITH LDL/HDL RATIO
Cholesterol, Total: 162 mg/dL (ref 100–199)
HDL: 43 mg/dL (ref >39)
LDL Chol Calc (NIH): 98 mg/dL (ref 0–99)
LDL/HDL Ratio: 2.3 ratio (ref 0.0–3.2)
Triglycerides: 117 mg/dL (ref 0–149)
VLDL Cholesterol Cal: 21 mg/dL (ref 5–40)

## 2019-08-16 LAB — HEMOGLOBIN A1C
Est. average glucose Bld gHb Est-mCnc: 114 mg/dL
Hgb A1c MFr Bld: 5.6 % (ref 4.8–5.6)

## 2019-08-16 LAB — CBC WITH DIFFERENTIAL/PLATELET
Basophils Absolute: 0.1 10*3/uL (ref 0.0–0.2)
Basos: 1 %
EOS (ABSOLUTE): 0.1 10*3/uL (ref 0.0–0.4)
Eos: 2 %
Hematocrit: 38.4 % (ref 34.0–46.6)
Hemoglobin: 12.5 g/dL (ref 11.1–15.9)
Immature Grans (Abs): 0 10*3/uL (ref 0.0–0.1)
Immature Granulocytes: 0 %
Lymphocytes Absolute: 2 10*3/uL (ref 0.7–3.1)
Lymphs: 34 %
MCH: 30 pg (ref 26.6–33.0)
MCHC: 32.6 g/dL (ref 31.5–35.7)
MCV: 92 fL (ref 79–97)
Monocytes Absolute: 0.6 10*3/uL (ref 0.1–0.9)
Monocytes: 10 %
Neutrophils Absolute: 3 10*3/uL (ref 1.4–7.0)
Neutrophils: 53 %
Platelets: 213 10*3/uL (ref 150–450)
RBC: 4.16 x10E6/uL (ref 3.77–5.28)
RDW: 13.2 % (ref 11.7–15.4)
WBC: 5.8 10*3/uL (ref 3.4–10.8)

## 2019-08-19 ENCOUNTER — Telehealth: Payer: Self-pay

## 2019-08-19 NOTE — Telephone Encounter (Signed)
LMTCB 08/19/2019   Thanks,   -Mickel Baas

## 2019-08-19 NOTE — Telephone Encounter (Signed)
-----   Message from Virginia Crews, MD sent at 08/18/2019  4:00 PM EST ----- Normal labs. Cholesterol well controlled. No changes to medications.

## 2019-08-19 NOTE — Telephone Encounter (Signed)
Viewed by [name] on 08/18/2019 6:39 PM

## 2019-08-20 ENCOUNTER — Other Ambulatory Visit: Payer: Self-pay | Admitting: Family Medicine

## 2019-09-08 ENCOUNTER — Encounter: Payer: Self-pay | Admitting: Family Medicine

## 2019-09-09 ENCOUNTER — Other Ambulatory Visit: Payer: Self-pay

## 2019-09-09 ENCOUNTER — Encounter: Payer: Self-pay | Admitting: Family Medicine

## 2019-09-09 ENCOUNTER — Telehealth (INDEPENDENT_AMBULATORY_CARE_PROVIDER_SITE_OTHER): Payer: Medicare Other | Admitting: Family Medicine

## 2019-09-09 VITALS — Temp 98.3°F | Ht 61.0 in | Wt 209.0 lb

## 2019-09-09 DIAGNOSIS — R0981 Nasal congestion: Secondary | ICD-10-CM

## 2019-09-09 DIAGNOSIS — G44201 Tension-type headache, unspecified, intractable: Secondary | ICD-10-CM

## 2019-09-09 DIAGNOSIS — Z20828 Contact with and (suspected) exposure to other viral communicable diseases: Secondary | ICD-10-CM | POA: Diagnosis not present

## 2019-09-09 DIAGNOSIS — Z20822 Contact with and (suspected) exposure to covid-19: Secondary | ICD-10-CM

## 2019-09-09 MED ORDER — ALPRAZOLAM 0.5 MG PO TABS: 0.5000 mg | ORAL_TABLET | Freq: Every evening | ORAL | 0 refills | Status: DC | PRN

## 2019-09-09 NOTE — Telephone Encounter (Signed)
If she has no other symptoms with her headache and it feels similar to previous migraines that she has had, she can come in to be seen today, but if she has any other symptoms then she will need to have a virtual visit.  If she does come in, please let her know we may give her medications that will make her need someone to drive her home

## 2019-09-09 NOTE — Telephone Encounter (Signed)
Called patient to offer the 11 am appointment for a virtual or in office visit depending on prescreening questionnaire. No answer/LMTCB. OK for PEC to schedule.

## 2019-09-09 NOTE — Progress Notes (Signed)
Patient: Marcia Livingston Female    DOB: 1946/05/13   73 y.o.   MRN: [id number] Visit Date: 09/09/2019  Today's Provider: Lavon Paganini, MD   Chief Complaint  Patient presents with  . Headache   Subjective:    I,Marcia Livingston,RMA am acting as a Education administrator for Marcia Crews, MD.   Virtual Visit via Video Note  I connected with Marcia Livingston on 09/09/19 at 11:20 AM EST by a video enabled telemedicine application and verified that I am speaking with the correct person using two identifiers.   Patient location: home Provider location: [place] involved in the visit: patient, provider   I discussed the limitations of evaluation and management by telemedicine and the availability of in person appointments. The patient expressed understanding and agreed to proceed.   Headache  The current episode started in the past 7 days. The problem occurs intermittently. The problem has been unchanged. The pain is located in the occipital, retro-orbital and temporal region. The quality of the pain is described as throbbing. Associated symptoms include coughing, dizziness, nausea, neck pain and sinus pressure. Pertinent negatives include no fever, rhinorrhea, sore throat or vomiting. The symptoms are aggravated by noise and bright light. She has tried acetaminophen for the symptoms. The treatment provided mild relief.     2 tylenol and 4 ibuprofen not helping. 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   Allergies  Allergen Reactions  . Penicillins Rash    Has patient had a PCN reaction  causing immediate rash, facial/tongue/throat swelling, SOB or lightheadedness with hypotension: [id number] Has patient had a PCN reaction causing severe rash involving mucus membranes or skin necrosis: no:[id number]} Has patient had a PCN reaction that required hospitalization no:[id number]} Has patient had a PCN reaction occurring within the last 10 years: no:[id number]} If all of the above answers are "NO", then may proceed with Cephalosporin use.   . Codeine Hives  . Sulfa Antibiotics Hives     Current Outpatient Medications:  .  aspirin EC 81 MG tablet, Take 81 mg by mouth daily., Disp: , Rfl:  .  buPROPion (WELLBUTRIN XL) 300 MG 24 hr tablet, TAKE 1 TABLET BY MOUTH  DAILY, Disp: 90 tablet, Rfl: 1 .  calcium carbonate (TUMS - DOSED IN MG ELEMENTAL CALCIUM) 500 MG chewable tablet, Chew 1 tablet by mouth 3 (three) times daily with meals., Disp: , Rfl:  .  cholecalciferol (VITAMIN D) 1000 units tablet, Take 1,000 Units by mouth daily., Disp: , Rfl:  .  colestipol (COLESTID) 1 g tablet, Take 1 g by mouth daily. , Disp: , Rfl:  .  DULoxetine (CYMBALTA) 60 MG capsule, TAKE 1 CAPSULE BY MOUTH  DAILY, Disp: 90 capsule, Rfl: 3 .  Fluticasone-Umeclidin-Vilant (TRELEGY ELLIPTA) 100-62.5-25 MCG/INH AEPB, Inhale 1 puff into the lungs daily. , Disp: , Rfl:  .  hydrochlorothiazide (HYDRODIURIL) 25 MG tablet, TAKE 1 TABLET BY MOUTH  DAILY, Disp: 90 tablet, Rfl: 1 .  losartan (COZAAR) 50 MG tablet, TAKE 1 TABLET BY MOUTH  DAILY, Disp: 90 tablet, Rfl: 1 .  rosuvastatin (CRESTOR) 5 MG tablet, Take  5 mg by mouth 3 (three) times a week., Disp: , Rfl:  .  meclizine (ANTIVERT) 25 MG tablet, Take 25 mg by mouth 3 (three) times daily as needed for dizziness., Disp: , Rfl:  .  traZODone (DESYREL) 50 MG tablet, Take 1 tablet (50 mg total) by mouth at bedtime. (Patient not taking: Reported on 09/09/2019), Disp: 90 tablet, Rfl: 3  Review of Systems  Constitutional: Negative for fever.  HENT: Positive for sinus  pressure. Negative for rhinorrhea and sore throat.   Respiratory: Positive for cough.   Gastrointestinal: Positive for nausea. Negative for vomiting.  Musculoskeletal: Positive for neck pain.  Neurological: Positive for dizziness and headaches.    Social History   Tobacco Use  . Smoking status: Former Smoker    Packs/day: 1.00    Years: 30.00    Pack years: 30.00    Types: Cigarettes    Quit date: 10/15/1996    Years since quitting: 22.9  . Smokeless tobacco: Never Used  Substance Use Topics  . Alcohol use: Yes    Comment: wine rarely      Objective:   Temp 98.3 F (36.8 C) (Tympanic)   Ht 5\' 1"  (1.549 m)   Wt 209 lb (94.8 kg)   BMI 39.49 kg/m  Vitals:   09/09/19 1132  Temp: 98.3 F (36.8 C)  TempSrc: Tympanic  Weight: 209 lb (94.8 kg)  Height: 5\' 1"  (1.549 m)  Body mass index is 39.49 kg/m.   Physical Exam Speaks in full sentences in no apparent distress.  Speech is normal with no dysarthria  No results found for any visits on 09/09/19.     Assessment & Plan      I discussed the assessment and treatment plan with the patient. The patient was provided an opportunity to ask questions and all were answered. The patient agreed with the plan and demonstrated an understanding of the instructions.   The patient was advised to call back or seek an in-person evaluation if the symptoms worsen or if the condition fails to improve as anticipated.  1. Acute intractable tension-type headache 2. Sinus congestion -New problem x4 days -Patient denies any red flag symptoms such as neurologic symptoms or signs of stroke -Given duration of headache without any worsening in clinical course, and without trauma, doubt any intracranial bleeding -Given her sinus congestion associated with the headache, suspect viral process -In the setting of the current pandemic, concern for possible COVID-19 infection -Patient advised on outpatient testing procedures -Discussed self-isolation  for 10 days from start of symptoms and until fever free for at least 2 days -Discussed symptom management, natural course, and return precautions -Discussed importance of hydrating well as she has also had leg cramps and slightly elevated sodium on last labs and suspect this plays a role in her ongoing headaches -Discussed emergent precautions including development of neurologic symptoms in association with her headache and need for in person evaluation   The entirety of the information documented in the History of Present Illness, Review of Systems and Physical Exam were personally obtained by me. Portions of this information were initially documented by Lyndel Pleasure, CMA and reviewed by me for thoroughness and accuracy.    , Dionne Bucy, MD MPH [facility]

## 2019-09-11 LAB — NOVEL CORONAVIRUS, NAA: SARS-CoV-2, NAA: NOT DETECTED

## 2019-09-17 DIAGNOSIS — R350 Frequency of micturition: Secondary | ICD-10-CM | POA: Diagnosis not present

## 2019-09-17 DIAGNOSIS — G4733 Obstructive sleep apnea (adult) (pediatric): Secondary | ICD-10-CM | POA: Diagnosis not present

## 2019-09-17 DIAGNOSIS — R8290 Unspecified abnormal findings in urine: Secondary | ICD-10-CM | POA: Diagnosis not present

## 2019-09-17 DIAGNOSIS — I350 Nonrheumatic aortic (valve) stenosis: Secondary | ICD-10-CM | POA: Diagnosis not present

## 2019-09-17 DIAGNOSIS — R609 Edema, unspecified: Secondary | ICD-10-CM | POA: Diagnosis not present

## 2019-09-17 DIAGNOSIS — Z9989 Dependence on other enabling machines and devices: Secondary | ICD-10-CM | POA: Diagnosis not present

## 2019-09-17 DIAGNOSIS — E782 Mixed hyperlipidemia: Secondary | ICD-10-CM | POA: Diagnosis not present

## 2019-09-17 DIAGNOSIS — I10 Essential (primary) hypertension: Secondary | ICD-10-CM | POA: Diagnosis not present

## 2019-09-17 DIAGNOSIS — R0600 Dyspnea, unspecified: Secondary | ICD-10-CM | POA: Diagnosis not present

## 2019-09-18 MED ORDER — DOXYCYCLINE HYCLATE 100 MG PO TABS: 100.0000 mg | ORAL_TABLET | Freq: Two times a day (BID) | ORAL | 0 refills | Status: AC

## 2019-09-29 ENCOUNTER — Other Ambulatory Visit: Payer: Self-pay

## 2019-09-29 ENCOUNTER — Ambulatory Visit (INDEPENDENT_AMBULATORY_CARE_PROVIDER_SITE_OTHER): Payer: Medicare Other | Admitting: Family Medicine

## 2019-09-29 ENCOUNTER — Encounter: Payer: Self-pay | Admitting: Family Medicine

## 2019-09-29 VITALS — BP 130/64 | HR 85 | Temp 96.6°F | Wt 219.0 lb

## 2019-09-29 DIAGNOSIS — R220 Localized swelling, mass and lump, head: Secondary | ICD-10-CM

## 2019-09-29 NOTE — Progress Notes (Signed)
Patient: Marcia Livingston Female    DOB: 12-09-1945   73 y.o.   MRN: [id number] Visit Date: 09/29/2019  Today's Provider: Lavon Paganini, MD   Chief Complaint  Patient presents with  . Abscess   Subjective:     HPI   Abscess: Patient presents for evaluation of a bump that she is concerned could be an abscess. Lesion is located in the scalp. First noticed it 2 weeks ago, but wonders if it has been present longer. Symptoms have been unchanged. Pt states the "Knot" is about the same size.Marland Kitchen No redness, tenderness, drainage, or fever. Patient does not have previous history of cutaneous abscesses. Patient does not have diabetes.  She wonders if it is just part of her scalp as she noticed a similar lump on the other side.  This "knot" is above L temple.   Allergies  Allergen Reactions  . Penicillins Rash    Has patient had a PCN reaction causing immediate rash, facial/tongue/throat swelling, SOB or lightheadedness with hypotension: [id number] Has patient had a PCN reaction causing severe rash involving mucus membranes or skin necrosis: no:[id number]} Has patient had a PCN reaction that required hospitalization no:[id number]} Has patient had a PCN reaction occurring within the last 10 years: no:[id number]} If all of the above answers are "NO", then may proceed with Cephalosporin use.   . Codeine Hives  . Sulfa Antibiotics Hives     Current Outpatient Medications:  .  ALPRAZolam (XANAX) 0.5 MG tablet, Take 1 tablet (0.5 mg total) by mouth at bedtime as needed for anxiety., Disp: 15 tablet, Rfl: 0 .  aspirin EC 81 MG tablet, Take 81 mg by mouth daily., Disp: , Rfl:  .  buPROPion (WELLBUTRIN XL) 300 MG 24 hr tablet, TAKE 1 TABLET BY MOUTH  DAILY, Disp: 90 tablet, Rfl: 1 .  calcium carbonate (TUMS - DOSED IN MG ELEMENTAL CALCIUM) 500 MG chewable tablet, Chew 1 tablet by mouth 3 (three) times daily with meals., Disp: , Rfl:  .  cholecalciferol (VITAMIN D) 1000 units tablet, Take 1,000  Units by mouth daily., Disp: , Rfl:  .  colestipol (COLESTID) 1 g tablet, Take 1 g by mouth daily. , Disp: , Rfl:  .  DULoxetine (CYMBALTA) 60 MG capsule, TAKE 1 CAPSULE BY MOUTH  DAILY, Disp: 90 capsule, Rfl: 3 .  Fluticasone-Umeclidin-Vilant (TRELEGY ELLIPTA) 100-62.5-25 MCG/INH AEPB, Inhale 1 puff into the lungs daily. , Disp: , Rfl:  .  hydrochlorothiazide (HYDRODIURIL) 25 MG tablet, TAKE 1 TABLET BY MOUTH  DAILY, Disp: 90 tablet, Rfl: 1 .  losartan (COZAAR) 50 MG tablet, TAKE 1 TABLET BY MOUTH  DAILY, Disp: 90 tablet, Rfl: 1 .  meclizine (ANTIVERT) 25 MG tablet, Take 25 mg by mouth 3 (three) times daily as needed for dizziness., Disp: , Rfl:  .  rosuvastatin (CRESTOR) 5 MG tablet, Take 5 mg by mouth 3 (three) times a week., Disp: , Rfl:  .  traZODone (DESYREL) 50 MG tablet, Take 1 tablet (50 mg total) by mouth at bedtime. (Patient not taking: Reported on 09/29/2019), Disp: 90 tablet, Rfl: 3  Review of Systems  Constitutional: Negative.   Respiratory: Negative.   Skin: Negative for color change, pallor, rash and wound.  Neurological: Positive for dizziness (History of Vertigo). Negative for light-headedness and headaches.    Social History   Tobacco Use  . Smoking status: Former Smoker    Packs/day: 1.00    Years: 30.00    Pack years: 30.00  Types: Cigarettes    Quit date: 10/15/1996    Years since quitting: 22.9  . Smokeless tobacco: Never Used  Substance Use Topics  . Alcohol use: Yes    Comment: wine rarely      Objective:   BP 130/64 (BP Location: Left Arm, Patient Position: Sitting, Cuff Size: Large)   Pulse 85   Temp (!) 96.6 F (35.9 C) (Temporal)   Wt 219 lb (99.3 kg)   BMI 41.38 kg/m  Vitals:   09/29/19 1542  BP: 130/64  Pulse: 85  Temp: (!) 96.6 F (35.9 C)  TempSrc: Temporal  Weight: 219 lb (99.3 kg)  Body mass index is 41.38 kg/m.   Physical Exam Vitals reviewed.  Constitutional:      General: She is not in acute distress.    Appearance:  Normal appearance.  HENT:     Head: Normocephalic and atraumatic.     Comments: Small, <0.5cm palpable lump on L scalp with similar lump on R scalp.  Non-mobile, no induration and no fluctuance. No erythema. Eyes:     General: No scleral icterus.    Conjunctiva/sclera: Conjunctivae normal.  Cardiovascular:     Rate and Rhythm: Normal rate and regular rhythm.  Pulmonary:     Effort: Pulmonary effort is normal. No respiratory distress.  Skin:    General: Skin is warm and dry.     Findings: No rash.  Neurological:     Mental Status: She is alert and oriented to person, place, and time. Mental status is at baseline.     Cranial Nerves: No cranial nerve deficit.  Psychiatric:        Mood and Affect: Mood normal.        Behavior: Behavior normal.      No results found for any visits on 09/29/19.     Assessment & Plan    1. Scalp lump - lump feels like a scalp ridge - no signs of abscess  - reassurance given - small chance of epidermal inclusion cyst, but doubt as it is symmetric bilaterally - discussed return precautions  Return if symptoms worsen or fail to improve.   The entirety of the information documented in the History of Present Illness, Review of Systems and Physical Exam were personally obtained by me. Portions of this information were initially documented by Ashley Royalty, CMA and reviewed by me for thoroughness and accuracy.    Hamlin Devine, Dionne Bucy, MD MPH [facility]

## 2019-09-30 ENCOUNTER — Encounter: Payer: Self-pay | Admitting: Family Medicine

## 2019-11-20 DIAGNOSIS — J42 Unspecified chronic bronchitis: Secondary | ICD-10-CM | POA: Diagnosis not present

## 2019-11-22 ENCOUNTER — Telehealth: Payer: Medicare Other | Admitting: Emergency Medicine

## 2019-11-22 DIAGNOSIS — M549 Dorsalgia, unspecified: Secondary | ICD-10-CM

## 2019-11-22 MED ORDER — PREDNISONE 20 MG PO TABS: 40.0000 mg | ORAL_TABLET | Freq: Every day | ORAL | 0 refills | Status: DC

## 2019-11-22 NOTE — Progress Notes (Signed)
We are sorry that you are not feeling well.  Here is how we plan to help!  Based on what you have shared with me it looks like you mostly have acute back pain.  Acute back pain is defined as musculoskeletal pain that can resolve in 1-3 weeks with conservative treatment.  I have prescribe a short course of prednisone for you.  This is a steroid.  Steroids can increase your blood sugar, make you irritable, make it hard to sleep, but his should help calm you back down.    You can continue with the treatments you have been doing at home. Some patients experience stomach irritation or in increased heartburn with anti-inflammatory drugs.  Please keep in mind that muscle relaxer's can cause fatigue and should not be taken while at work or driving.  Back pain is very common.  The pain often gets better over time.  The cause of back pain is usually not dangerous.  Most people can learn to manage their back pain on their own.  Home Care  Stay active.  Start with short walks on flat ground if you can.  Try to walk farther each day.  Do not sit, drive or stand in one place for more than 30 minutes.  Do not stay in bed.  Do not avoid exercise or work.  Activity can help your back heal faster.  Be careful when you bend or lift an object.  Bend at your knees, keep the object close to you, and do not twist.  Sleep on a firm mattress.  Lie on your side, and bend your knees.  If you lie on your back, put a pillow under your knees.  Only take medicines as told by your doctor.  Put ice on the injured area.  Put ice in a plastic bag  Place a towel between your skin and the bag  Leave the ice on for 15-20 minutes, 3-4 times a day for the first 2-3 days. 210 After that, you can switch between ice and heat packs.  Ask your doctor about back exercises or massage.  Avoid feeling anxious or stressed.  Find good ways to deal with stress, such as exercise.  Get Help Right Way If:  Your pain does not go away  with rest or medicine.  Your pain does not go away in 1 week.  You have new problems.  You do not feel well.  The pain spreads into your legs.  You cannot control when you poop (bowel movement) or pee (urinate)  You feel sick to your stomach (nauseous) or throw up (vomit)  You have belly (abdominal) pain.  You feel like you may pass out (faint).  If you develop a fever.  Make Sure you:  Understand these instructions.  Will watch your condition  Will get help right away if you are not doing well or get worse.  Your e-visit answers were reviewed by a board certified advanced clinical practitioner to complete your personal care plan.  Depending on the condition, your plan could have included both over the counter or prescription medications.  If there is a problem please reply  once you have received a response from your provider.  Your safety is important to [place].  If you have drug allergies check your prescription carefully.    You can use MyChart to ask questions about today's visit, request a non-urgent call back, or ask for a work or school excuse for 24 hours related to this e-Visit.  If it has been greater than 24 hours you will need to follow up with your provider, or enter a new e-Visit to address those concerns.  You will get an e-mail in the next two days asking about your experience.  I hope that your e-visit has been valuable and will speed your recovery. Thank you for using e-visits.  Greater than 5 minutes, yet less than 10 minutes was used in reviewing the patient's chart, questionnaire, prescribing medications, and documentation for this visit.

## 2019-12-12 ENCOUNTER — Ambulatory Visit: Payer: Medicare Other | Attending: Internal Medicine

## 2019-12-12 DIAGNOSIS — Z23 Encounter for immunization: Secondary | ICD-10-CM | POA: Insufficient documentation

## 2019-12-12 NOTE — Progress Notes (Signed)
   Covid-19 Vaccination Clinic  Name:  Marcia Livingston    MRN: [id number] DOB: 03-07-1946  12/12/2019  Marcia Livingston was observed post Covid-19 immunization for 15 minutes without incidence. She was provided with Vaccine Information Sheet and instruction to access the V-Safe system.   Marcia Livingston was instructed to call 911 with any severe reactions post vaccine: Marland Kitchen Difficulty breathing  . Swelling of your face and throat  . A fast heartbeat  . A bad rash all over your body  . Dizziness and weakness    Immunizations Administered    Name Date Dose VIS Date Route   Pfizer COVID-19 Vaccine 12/12/2019 11:57 AM 0.3 mL 09/26/2019 Intramuscular   Manufacturer: [place]   Lot: [id number]   [place]: [id number]

## 2019-12-18 DIAGNOSIS — H26492 Other secondary cataract, left eye: Secondary | ICD-10-CM | POA: Diagnosis not present

## 2020-01-06 ENCOUNTER — Ambulatory Visit: Payer: Medicare Other | Attending: Internal Medicine

## 2020-01-06 DIAGNOSIS — Z23 Encounter for immunization: Secondary | ICD-10-CM

## 2020-01-06 NOTE — Progress Notes (Signed)
   Covid-19 Vaccination Clinic  Name:  ALEYNNA ROZEK    MRN: [id number] DOB: [birth year]  01/06/2020  Ms. Petrey was observed post Covid-19 immunization for 15 minutes without incident. She was provided with Vaccine Information Sheet and instruction to access the V-Safe system.   Ms. Sontag was instructed to call 911 with any severe reactions post vaccine: Marland Kitchen Difficulty breathing  . Swelling of face and throat  . A fast heartbeat  . A bad rash all over body  . Dizziness and weakness   Immunizations Administered    Name Date Dose VIS Date Route   Pfizer COVID-19 Vaccine 01/06/2020  2:02 PM 0.3 mL 09/26/2019 Intramuscular   Manufacturer: [place]   Lot: [id number]   [place]: [id number]

## 2020-01-28 DIAGNOSIS — L281 Prurigo nodularis: Secondary | ICD-10-CM | POA: Diagnosis not present

## 2020-01-28 DIAGNOSIS — Z85828 Personal history of other malignant neoplasm of skin: Secondary | ICD-10-CM | POA: Diagnosis not present

## 2020-01-28 DIAGNOSIS — X32XXXA Exposure to sunlight, initial encounter: Secondary | ICD-10-CM | POA: Diagnosis not present

## 2020-01-28 DIAGNOSIS — D2261 Melanocytic nevi of right upper limb, including shoulder: Secondary | ICD-10-CM | POA: Diagnosis not present

## 2020-01-28 DIAGNOSIS — L570 Actinic keratosis: Secondary | ICD-10-CM | POA: Diagnosis not present

## 2020-01-28 DIAGNOSIS — D225 Melanocytic nevi of trunk: Secondary | ICD-10-CM | POA: Diagnosis not present

## 2020-01-28 DIAGNOSIS — D2262 Melanocytic nevi of left upper limb, including shoulder: Secondary | ICD-10-CM | POA: Diagnosis not present

## 2020-01-28 DIAGNOSIS — C44519 Basal cell carcinoma of skin of other part of trunk: Secondary | ICD-10-CM | POA: Diagnosis not present

## 2020-01-28 DIAGNOSIS — D485 Neoplasm of uncertain behavior of skin: Secondary | ICD-10-CM | POA: Diagnosis not present

## 2020-01-28 DIAGNOSIS — L298 Other pruritus: Secondary | ICD-10-CM | POA: Diagnosis not present

## 2020-01-29 ENCOUNTER — Other Ambulatory Visit: Payer: Self-pay | Admitting: Family Medicine

## 2020-01-29 NOTE — Telephone Encounter (Signed)
Requested  medications are  due for refill today yes  Requested medications are on the active medication list yes  Last refill 2/23  Future visit scheduled yes  Notes to clinic Has an appt with Dr Brita Romp in 2 weeks. Not sure if should refill so close to appt in case change in med or dose, Rx is via mail order.

## 2020-01-29 NOTE — Progress Notes (Signed)
Established patient visit     Patient: Marcia Livingston   DOB: 10/30/1945   74 y.o. Female  MRN: [id number] Visit Date: 01/30/2020  Today's healthcare provider: Trinna Post, PA-C  Subjective:    Nikki Dom Arleth Mccullar,acting as a scribe for Trinna Post, PA-C.,have documented all relevant documentation on the behalf of Trinna Post, PA-C,as directed by  Trinna Post, PA-C while in the presence of Trinna Post, PA-C.  Chief Complaint  Patient presents with  . Anxiety   Anxiety Presents for follow-up visit. Symptoms include dry mouth, excessive worry, irritability, nervous/anxious behavior, panic and restlessness. Patient reports no chest pain, decreased concentration, depressed mood, dizziness, feeling of choking, nausea, obsessions, palpitations, shortness of breath or suicidal ideas. Primary symptoms comment: weakness and tremors . Symptoms occur constantly. The severity of symptoms is mild. The quality of sleep is poor. Nighttime awakenings: several.    Lost daughter in 2014 and afterwards anxiety increased more. History of HTN, Aortic stenosis and anxiety Currently on 60 mg of Cymbalta and 300 mg Wellbutrin Wellbutrin was increased in the passed year.     Medications: Outpatient Medications Prior to Visit  Medication Sig  . ALPRAZolam (XANAX) 0.5 MG tablet Take 1 tablet (0.5 mg total) by mouth at bedtime as needed for anxiety.  Marland Kitchen aspirin EC 81 MG tablet Take 81 mg by mouth daily.  Marland Kitchen buPROPion (WELLBUTRIN XL) 300 MG 24 hr tablet TAKE 1 TABLET BY MOUTH  DAILY  . calcium carbonate (TUMS - DOSED IN MG ELEMENTAL CALCIUM) 500 MG chewable tablet Chew 1 tablet by mouth 3 (three) times daily with meals.  . cholecalciferol (VITAMIN D) 1000 units tablet Take 1,000 Units by mouth daily.  . colestipol (COLESTID) 1 g tablet Take 1 g by mouth daily.   . DULoxetine (CYMBALTA) 60 MG capsule TAKE 1 CAPSULE BY MOUTH  DAILY  . Fluticasone-Umeclidin-Vilant (TRELEGY ELLIPTA)  100-62.5-25 MCG/INH AEPB Inhale 1 puff into the lungs daily.   . hydrochlorothiazide (HYDRODIURIL) 25 MG tablet TAKE 1 TABLET BY MOUTH  DAILY  . losartan (COZAAR) 50 MG tablet TAKE 1 TABLET BY MOUTH  DAILY  . meclizine (ANTIVERT) 25 MG tablet Take 25 mg by mouth 3 (three) times daily as needed for dizziness.  . rosuvastatin (CRESTOR) 5 MG tablet Take 5 mg by mouth 3 (three) times a week.  . traZODone (DESYREL) 50 MG tablet Take 1 tablet (50 mg total) by mouth at bedtime.  . triamcinolone ointment (KENALOG) 0.1 % APPLY TO AFFECTED AREAS ON ARMS TWICE DAILY AS NEEDED  . predniSONE (DELTASONE) 20 MG tablet Take 2 tablets (40 mg total) by mouth daily. Take 40 mg by mouth daily for 3 days, then 20mg  by mouth daily for 3 days, then 10mg  daily for 3 days (Patient not taking: Reported on 01/30/2020)   No facility-administered medications prior to visit.    Review of Systems  Constitutional: Positive for irritability. Negative for fatigue.  Respiratory: Negative for chest tightness and shortness of breath.   Cardiovascular: Negative for chest pain and palpitations.  Gastrointestinal: Negative for nausea.  Neurological: Positive for weakness. Negative for dizziness.  Psychiatric/Behavioral: Positive for sleep disturbance. Negative for decreased concentration and suicidal ideas. The patient is nervous/anxious.     Last metabolic panel Lab Results  Component Value Date   GLUCOSE 82 08/15/2019   NA 145 (H) 08/15/2019   K 3.7 08/15/2019   CL 106 08/15/2019   CO2 25 08/15/2019   BUN 12 08/15/2019  CREATININE 0.80 08/15/2019   GFRNONAA 73 08/15/2019   GFRAA 84 08/15/2019   CALCIUM 8.6 (L) 08/15/2019   PROT 6.2 08/15/2019   ALBUMIN 3.8 08/15/2019   LABGLOB 2.4 08/15/2019   AGRATIO 1.6 08/15/2019   BILITOT 0.3 08/15/2019   ALKPHOS 97 08/15/2019   AST 14 08/15/2019   ALT 21 08/15/2019   ANIONGAP 8 10/23/2015        Objective:    BP 136/78 (BP Location: Right Arm, Patient Position:  Sitting, Cuff Size: Large)   Pulse 76   Temp (!) 96.2 F (35.7 C) (Temporal)   Wt 214 lb 6.4 oz (97.3 kg)   SpO2 96%   BMI 40.51 kg/m    Physical Exam Constitutional:      Appearance: Normal appearance.  Cardiovascular:     Rate and Rhythm: Normal rate and regular rhythm.     Heart sounds: Murmur present. Crescendo  decrescendo  systolic murmur present with a grade of 3/6.  Pulmonary:     Effort: Pulmonary effort is normal.     Breath sounds: Normal breath sounds.  Musculoskeletal:     Right lower leg: No edema.     Left lower leg: No edema.  Skin:    General: Skin is warm and dry.  Neurological:     General: No focal deficit present.     Mental Status: She is alert and oriented to person, place, and time.  Psychiatric:        Mood and Affect: Mood normal.        Behavior: Behavior normal.       No results found for any visits on 01/30/20.    Assessment & Plan:    1. Anxiety Add Buspar to current medications and checked labs as below Consider reducing Wellbutrin dose to help with tremor - busPIRone (BUSPAR) 7.5 MG tablet; Take 1 tablet (7.5 mg total) by mouth 2 (two) times daily.  Dispense: 180 tablet; Refill: 0 - Comprehensive metabolic panel - CBC with Differential/Platelet - TSH  2. Prediabetes  - Hemoglobin A1c  3. Essential hypertension  - CBC with Differential/Platelet     Return in about 6 weeks (around 03/12/2020).        Paulene Floor  [facility] [phone] (phone) [phone] (fax)  [place]

## 2020-01-30 ENCOUNTER — Ambulatory Visit (INDEPENDENT_AMBULATORY_CARE_PROVIDER_SITE_OTHER): Payer: Medicare Other | Admitting: Physician Assistant

## 2020-01-30 ENCOUNTER — Other Ambulatory Visit: Payer: Self-pay

## 2020-01-30 ENCOUNTER — Encounter: Payer: Self-pay | Admitting: Physician Assistant

## 2020-01-30 VITALS — BP 136/78 | HR 76 | Temp 96.2°F | Wt 214.4 lb

## 2020-01-30 DIAGNOSIS — I10 Essential (primary) hypertension: Secondary | ICD-10-CM

## 2020-01-30 DIAGNOSIS — F419 Anxiety disorder, unspecified: Secondary | ICD-10-CM

## 2020-01-30 DIAGNOSIS — C4490 Unspecified malignant neoplasm of skin, unspecified: Secondary | ICD-10-CM | POA: Insufficient documentation

## 2020-01-30 DIAGNOSIS — R7303 Prediabetes: Secondary | ICD-10-CM | POA: Diagnosis not present

## 2020-01-30 MED ORDER — BUSPIRONE HCL 7.5 MG PO TABS: 7.5000 mg | ORAL_TABLET | Freq: Two times a day (BID) | ORAL | 0 refills | Status: DC

## 2020-01-30 NOTE — Patient Instructions (Signed)

## 2020-01-31 LAB — COMPREHENSIVE METABOLIC PANEL
ALT: 19 IU/L (ref 0–32)
AST: 22 IU/L (ref 0–40)
Albumin/Globulin Ratio: 1.6 (ref 1.2–2.2)
Albumin: 4.3 g/dL (ref 3.7–4.7)
Alkaline Phosphatase: 151 IU/L — ABNORMAL HIGH (ref 39–117)
BUN/Creatinine Ratio: 10 — ABNORMAL LOW (ref 12–28)
BUN: 9 mg/dL (ref 8–27)
Bilirubin Total: 0.4 mg/dL (ref 0.0–1.2)
CO2: 20 mmol/L (ref 20–29)
Calcium: 9.4 mg/dL (ref 8.7–10.3)
Chloride: 103 mmol/L (ref 96–106)
Creatinine, Ser: 0.88 mg/dL (ref 0.57–1.00)
GFR calc Af Amer: 74 mL/min/{1.73_m2} (ref >59)
GFR calc non Af Amer: 64 mL/min/{1.73_m2} (ref >59)
Globulin, Total: 2.7 g/dL (ref 1.5–4.5)
Glucose: 82 mg/dL (ref 65–99)
Potassium: 4.1 mmol/L (ref 3.5–5.2)
Sodium: 141 mmol/L (ref 134–144)
Total Protein: 7 g/dL (ref 6.0–8.5)

## 2020-01-31 LAB — CBC WITH DIFFERENTIAL/PLATELET
Basophils Absolute: 0.1 10*3/uL (ref 0.0–0.2)
Basos: 1 %
EOS (ABSOLUTE): 0.3 10*3/uL (ref 0.0–0.4)
Eos: 4 %
Hematocrit: 42.2 % (ref 34.0–46.6)
Hemoglobin: 13.8 g/dL (ref 11.1–15.9)
Immature Grans (Abs): 0 10*3/uL (ref 0.0–0.1)
Immature Granulocytes: 0 %
Lymphocytes Absolute: 2.7 10*3/uL (ref 0.7–3.1)
Lymphs: 35 %
MCH: 29.2 pg (ref 26.6–33.0)
MCHC: 32.7 g/dL (ref 31.5–35.7)
MCV: 89 fL (ref 79–97)
Monocytes Absolute: 0.7 10*3/uL (ref 0.1–0.9)
Monocytes: 9 %
Neutrophils Absolute: 3.9 10*3/uL (ref 1.4–7.0)
Neutrophils: 51 %
Platelets: 267 10*3/uL (ref 150–450)
RBC: 4.73 x10E6/uL (ref 3.77–5.28)
RDW: 12.5 % (ref 11.7–15.4)
WBC: 7.7 10*3/uL (ref 3.4–10.8)

## 2020-01-31 LAB — HEMOGLOBIN A1C
Est. average glucose Bld gHb Est-mCnc: 114 mg/dL
Hgb A1c MFr Bld: 5.6 % (ref 4.8–5.6)

## 2020-01-31 LAB — TSH: TSH: 1.3 u[IU]/mL (ref 0.450–4.500)

## 2020-02-09 DIAGNOSIS — Z03818 Encounter for observation for suspected exposure to other biological agents ruled out: Secondary | ICD-10-CM | POA: Diagnosis not present

## 2020-02-09 DIAGNOSIS — J069 Acute upper respiratory infection, unspecified: Secondary | ICD-10-CM | POA: Diagnosis not present

## 2020-02-09 DIAGNOSIS — Z20822 Contact with and (suspected) exposure to covid-19: Secondary | ICD-10-CM | POA: Diagnosis not present

## 2020-02-13 ENCOUNTER — Other Ambulatory Visit: Payer: Self-pay

## 2020-02-13 ENCOUNTER — Ambulatory Visit (INDEPENDENT_AMBULATORY_CARE_PROVIDER_SITE_OTHER): Payer: Medicare Other | Admitting: Family Medicine

## 2020-02-13 ENCOUNTER — Encounter: Payer: Self-pay | Admitting: Family Medicine

## 2020-02-13 VITALS — BP 118/68 | HR 66 | Temp 96.6°F | Wt 214.0 lb

## 2020-02-13 DIAGNOSIS — E7800 Pure hypercholesterolemia, unspecified: Secondary | ICD-10-CM | POA: Diagnosis not present

## 2020-02-13 DIAGNOSIS — I10 Essential (primary) hypertension: Secondary | ICD-10-CM | POA: Diagnosis not present

## 2020-02-13 DIAGNOSIS — F5104 Psychophysiologic insomnia: Secondary | ICD-10-CM

## 2020-02-13 DIAGNOSIS — F419 Anxiety disorder, unspecified: Secondary | ICD-10-CM

## 2020-02-13 DIAGNOSIS — R7303 Prediabetes: Secondary | ICD-10-CM | POA: Diagnosis not present

## 2020-02-13 DIAGNOSIS — F3342 Major depressive disorder, recurrent, in full remission: Secondary | ICD-10-CM | POA: Diagnosis not present

## 2020-02-13 MED ORDER — TRAZODONE HCL 50 MG PO TABS: ORAL_TABLET | ORAL | 1 refills | Status: AC

## 2020-02-13 MED ORDER — BUPROPION HCL ER (XL) 150 MG PO TB24
150.0000 mg | ORAL_TABLET | Freq: Every day | ORAL | 1 refills | Status: DC
Start: 2020-02-13 — End: 2020-06-24

## 2020-02-13 NOTE — Patient Instructions (Signed)

## 2020-02-13 NOTE — Assessment & Plan Note (Signed)
Chronic and uncontrolled As above, stopped trazodone as she wasn't sure if she could take it with the BuSpar We will resume at 50 to 100 mg nightly as needed Treatment of anxiety as above Decreasing Wellbutrin and getting off of prednisone may also be helpful Follow-up in 1 month

## 2020-02-13 NOTE — Assessment & Plan Note (Signed)
Treat anxiety as above.

## 2020-02-13 NOTE — Assessment & Plan Note (Addendum)
Chronic and uncontrolled.  Pt reports no improvement on Buspar Worsening insomnia.  Pt stopped Trazodone secondary to starting Buspar.  She was not sure she could take together.  Will restart Trazodone and increase to 50-100mg  every night. Decrease wellbutrin as this is likely contributing to high anxiety and tremor Recheck in one month.

## 2020-02-13 NOTE — Assessment & Plan Note (Signed)
Review recent lab work.  Continue to work on diet and exercise.  Recheck in 6 months.

## 2020-02-13 NOTE — Assessment & Plan Note (Signed)
Previously well controlled Continue Crestor three times a week Goal LDL < 70

## 2020-02-13 NOTE — Assessment & Plan Note (Signed)
Well controlled Continue current medications Reviewed recent lab work.  F/u in 6 months

## 2020-02-13 NOTE — Progress Notes (Signed)
I,Laura E Walsh,acting as a scribe for Lavon Paganini, MD.,have documented all relevant documentation on the behalf of Lavon Paganini, MD,as directed by  Lavon Paganini, MD while in the presence of Lavon Paganini, MD.   Established patient visit   Patient: Marcia Livingston   DOB: May 01, 1946   74 y.o. Female  MRN: [id number]   Today's healthcare provider: Lavon Paganini, MD   Chief Complaint  Patient presents with  . Anxiety  . Insomnia  . Hypertension  . Hyperglycemia  . Hyperlipidemia   Subjective    Insomnia Primary symptoms: fragmented sleep, sleep disturbance, difficulty falling asleep.  The problem occurs nightly. The problem has been gradually worsening since onset. How many beverages per day that contain caffeine: 0 - 1.  Types of beverages you drink: coffee. Past treatments include medication. The treatment provided mild relief. Typical bedtime:  10-11 P.M..  How long after going to bed to you fall asleep: over an hour.   PMH includes: no apnea.  Anxiety Presents for follow-up visit. Symptoms include decreased concentration, depressed mood, excessive worry, insomnia, nervous/anxious behavior and panic. Patient reports no confusion, dizziness, nausea, shortness of breath or suicidal ideas.   (Anxiety)   Depression screen [facility] 2/9 02/13/2020 08/15/2019 08/13/2019 03/17/2019 02/14/2019  Decreased Interest 0 0 0 0 2  Down, Depressed, Hopeless 0 0 0 0 2  PHQ - 2 Score 0 0 0 0 4  Altered sleeping 3 0 - - 2  Tired, decreased energy 3 0 - - 2  Change in appetite 2 0 - - 2  Feeling bad or failure about yourself  0 0 - - 1  Trouble concentrating 1 0 - - 3  Moving slowly or fidgety/restless 2 0 - - 0  Suicidal thoughts 0 0 - - 0  PHQ-9 Score 11 0 - - 14  Difficult doing work/chores Somewhat difficult Not difficult at all - - Somewhat difficult   GAD 7 : Generalized Anxiety Score 02/13/2020 03/17/2019 02/14/2019 01/16/2018  Nervous, Anxious, on Edge 2 0 1 2  Control/stop  worrying 3 0 2 0  Worry too much - different things 3 1 2 3   Trouble relaxing 3 0 0 3  Restless 1 0 2 2  Easily annoyed or irritable 2 0 2 3  Afraid - awful might happen 1 0 3 1  Total GAD 7 Score 15 1 12 14   Anxiety Difficulty Very difficult Not difficult at all Somewhat difficult Somewhat difficult      Hypertension, follow-up  BP Readings from Last 3 Encounters:  02/13/20 118/68  01/30/20 136/78  09/29/19 130/64   Wt Readings from Last 3 Encounters:  02/13/20 214 lb (97.1 kg)  01/30/20 214 lb 6.4 oz (97.3 kg)  09/29/19 219 lb (99.3 kg)     She was last seen for hypertension 6 months ago.  BP at that visit was 137/73. Management since that visit includes no changes.  She reports excellent compliance with treatment. She is not having side effects.  She is following a Regular diet. She does not smoke.  Use of agents associated with hypertension: none.   Outside blood pressures are not being checked. Symptoms: No chest pain No chest pressure No palpitations No dyspnea No orthopnea No paroxysmal nocturnal dyspnea No lower extremity edema No syncope   Pertinent labs: Lab Results  Component Value Date   CHOL 162 08/15/2019   HDL 43 08/15/2019   LDLCALC 98 08/15/2019   TRIG 117 08/15/2019   CHOLHDL  4.2 08/09/2018   Lab Results  Component Value Date   NA 141 01/30/2020   K 4.1 01/30/2020   CO2 20 01/30/2020   GLUCOSE 82 01/30/2020   BUN 9 01/30/2020   CREATININE 0.88 01/30/2020   CALCIUM 9.4 01/30/2020   GFRNONAA 64 01/30/2020   GFRAA 74 01/30/2020     The 10-year ASCVD risk score Mikey Bussing DC Jr., et al., 2013) is: 17.8%   --------------------------------------------------------------------------------------------------- Prediabetes, Follow-up  Lab Results  Component Value Date   HGBA1C 5.6 01/30/2020   HGBA1C 5.6 08/15/2019   HGBA1C 5.9 (A) 02/17/2019   GLUCOSE 82 01/30/2020   GLUCOSE 82 08/15/2019   GLUCOSE 91 08/09/2018    Last seen for for  this6 months ago.  Management since that visit includes no changes. Current symptoms include none and have been stable.   Pertinent Labs:    Component Value Date/Time   CHOL 162 08/15/2019 1044   TRIG 117 08/15/2019 1044   CHOLHDL 4.2 08/09/2018 1127   CREATININE 0.88 01/30/2020 1044   CREATININE 0.86 02/27/2012 0912    Wt Readings from Last 3 Encounters:  02/13/20 214 lb (97.1 kg)  01/30/20 214 lb 6.4 oz (97.3 kg)  09/29/19 219 lb (99.3 kg)    -----------------------------------------------------------------------------------------    Patient Active Problem List   Diagnosis Date Noted  . Skin cancer 01/30/2020  . Morbid obesity ([place]) 08/15/2019  . Prediabetes 02/14/2019  . Allergic rhinitis 07/04/2018  . OSA (obstructive sleep apnea) 01/16/2018  . GERD (gastroesophageal reflux disease) 01/16/2018  . Insomnia 01/16/2018  . Mild aortic stenosis 01/16/2018  . Angina pectoris ([place]) 10/22/2015  . HTN (hypertension) 10/22/2015  . High cholesterol 10/22/2015  . Anxiety 10/22/2015  . Depression 10/22/2015  . CAD (coronary artery disease) 10/22/2015  . H/O cardiac catheterization 06/02/2014   Social History   Tobacco Use  . Smoking status: Former Smoker    Packs/day: 1.00    Years: 30.00    Pack years: 30.00    Types: Cigarettes    Quit date: 10/15/1996    Years since quitting: 23.3  . Smokeless tobacco: Never Used  Substance Use Topics  . Alcohol use: Yes    Comment: wine rarely  . Drug use: No   Allergies  Allergen Reactions  . Penicillins Rash    Has patient had a PCN reaction causing immediate rash, facial/tongue/throat swelling, SOB or lightheadedness with hypotension: [id number] Has patient had a PCN reaction causing severe rash involving mucus membranes or skin necrosis: no:[id number]} Has patient had a PCN reaction that required hospitalization no:[id number]} Has patient had a PCN reaction occurring within the last 10 years: no:[id number]} If all of the  above answers are "NO", then may proceed with Cephalosporin use.   . Codeine Hives  . Sulfa Antibiotics Hives       Medications: Outpatient Medications Prior to Visit  Medication Sig  . aspirin EC 81 MG tablet Take 81 mg by mouth daily.  . busPIRone (BUSPAR) 7.5 MG tablet Take 1 tablet (7.5 mg total) by mouth 2 (two) times daily.  . calcium carbonate (TUMS - DOSED IN MG ELEMENTAL CALCIUM) 500 MG chewable tablet Chew 1 tablet by mouth 3 (three) times daily with meals.  . cholecalciferol (VITAMIN D) 1000 units tablet Take 1,000 Units by mouth daily.  . colestipol (COLESTID) 1 g tablet Take 1 g by mouth daily.   . DULoxetine (CYMBALTA) 60 MG capsule TAKE 1 CAPSULE BY MOUTH  DAILY  . Fluticasone-Umeclidin-Vilant (TRELEGY ELLIPTA) 100-62.5-25 MCG/INH AEPB Inhale  1 puff into the lungs daily.   . hydrochlorothiazide (HYDRODIURIL) 25 MG tablet TAKE 1 TABLET BY MOUTH  DAILY  . losartan (COZAAR) 50 MG tablet TAKE 1 TABLET BY MOUTH  DAILY  . meclizine (ANTIVERT) 25 MG tablet Take 25 mg by mouth 3 (three) times daily as needed for dizziness.  . rosuvastatin (CRESTOR) 5 MG tablet Take 5 mg by mouth 3 (three) times a week.  . triamcinolone ointment (KENALOG) 0.1 % APPLY TO AFFECTED AREAS ON ARMS TWICE DAILY AS NEEDED  . [DISCONTINUED] ALPRAZolam (XANAX) 0.5 MG tablet Take 1 tablet (0.5 mg total) by mouth at bedtime as needed for anxiety.  . [DISCONTINUED] buPROPion (WELLBUTRIN XL) 300 MG 24 hr tablet TAKE 1 TABLET BY MOUTH  DAILY  . [DISCONTINUED] predniSONE (DELTASONE) 20 MG tablet Take 2 tablets (40 mg total) by mouth daily. Take 40 mg by mouth daily for 3 days, then 20mg  by mouth daily for 3 days, then 10mg  daily for 3 days (Patient not taking: Reported on 01/30/2020)  . [DISCONTINUED] traZODone (DESYREL) 50 MG tablet Take 1 tablet (50 mg total) by mouth at bedtime. (Patient not taking: Reported on 02/13/2020)   No facility-administered medications prior to visit.    Review of Systems    Constitutional: Positive for fatigue. Negative for activity change, appetite change, chills, diaphoresis, fever and unexpected weight change.  Respiratory: Positive for cough. Negative for apnea, choking, chest tightness, shortness of breath, wheezing and stridor.   Cardiovascular: Negative.   Gastrointestinal: Positive for constipation. Negative for abdominal distention, abdominal pain, anal bleeding, blood in stool, diarrhea, nausea, rectal pain and vomiting.  Endocrine: Negative.   Neurological: Negative for dizziness, light-headedness and headaches.  Psychiatric/Behavioral: Positive for decreased concentration, dysphoric mood and sleep disturbance. Negative for agitation, behavioral problems, confusion, hallucinations, self-injury and suicidal ideas. The patient is nervous/anxious and has insomnia. The patient is not hyperactive.     Last CBC Lab Results  Component Value Date   WBC 7.7 01/30/2020   HGB 13.8 01/30/2020   HCT 42.2 01/30/2020   MCV 89 01/30/2020   MCH 29.2 01/30/2020   RDW 12.5 01/30/2020   PLT 267 [id number]   Last metabolic panel Lab Results  Component Value Date   GLUCOSE 82 01/30/2020   NA 141 01/30/2020   K 4.1 01/30/2020   CL 103 01/30/2020   CO2 20 01/30/2020   BUN 9 01/30/2020   CREATININE 0.88 01/30/2020   GFRNONAA 64 01/30/2020   GFRAA 74 01/30/2020   CALCIUM 9.4 01/30/2020   PROT 7.0 01/30/2020   ALBUMIN 4.3 01/30/2020   LABGLOB 2.7 01/30/2020   AGRATIO 1.6 01/30/2020   BILITOT 0.4 01/30/2020   ALKPHOS 151 (H) 01/30/2020   AST 22 01/30/2020   ALT 19 01/30/2020   ANIONGAP 8 10/23/2015   Last lipids Lab Results  Component Value Date   CHOL 162 08/15/2019   HDL 43 08/15/2019   LDLCALC 98 08/15/2019   TRIG 117 08/15/2019   CHOLHDL 4.2 08/09/2018   Last hemoglobin A1c Lab Results  Component Value Date   HGBA1C 5.6 01/30/2020   Last thyroid functions Lab Results  Component Value Date   TSH 1.300 01/30/2020   Last vitamin D No  results found for: 25OHVITD2, 25OHVITD3, VD25OH Last vitamin B12 and Folate No results found for: VITAMINB12, FOLATE    Objective    BP 118/68 (BP Location: Left Arm, Patient Position: Sitting, Cuff Size: Large)   Pulse 66   Temp (!) 96.6 F (35.9 C) (Temporal)  Wt 214 lb (97.1 kg)   SpO2 96%   BMI 40.43 kg/m  BP Readings from Last 3 Encounters:  02/13/20 118/68  01/30/20 136/78  09/29/19 130/64      Physical Exam Constitutional:      Appearance: Normal appearance. She is normal weight.  Eyes:     Conjunctiva/sclera: Conjunctivae normal.  Cardiovascular:     Rate and Rhythm: Normal rate and regular rhythm.     Heart sounds: Murmur present.  Pulmonary:     Effort: Pulmonary effort is normal.     Breath sounds: Normal breath sounds.  Abdominal:     Tenderness: There is no abdominal tenderness.  Musculoskeletal:     Right lower leg: No edema.     Left lower leg: No edema.  Skin:    General: Skin is warm and dry.  Neurological:     Mental Status: She is oriented to person, place, and time. Mental status is at baseline.     Comments: Tremor of bilateral hands  Psychiatric:        Mood and Affect: Affect normal. Mood is anxious.        Behavior: Behavior normal. Behavior is cooperative.        Thought Content: Thought content normal. Thought content does not include homicidal or suicidal ideation.        Judgment: Judgment normal.       No results found for any visits on 02/13/20.  Assessment & Plan     Problem List Items Addressed This Visit      Cardiovascular and Mediastinum   HTN (hypertension) - Primary    Well controlled Continue current medications Reviewed recent lab work.  F/u in 6 months         Other   High cholesterol    Previously well controlled Continue Crestor three times a week Goal LDL < 70      Anxiety    Chronic and uncontrolled.  Pt reports no improvement on Buspar Worsening insomnia.  Pt stopped Trazodone secondary to  starting Buspar.  She was not sure she could take together.  Will restart Trazodone and increase to 50-100mg  every night. Decrease wellbutrin as this is likely contributing to high anxiety and tremor Recheck in one month.         Relevant Medications   traZODone (DESYREL) 50 MG tablet   buPROPion (WELLBUTRIN XL) 150 MG 24 hr tablet   Depression    Treat anxiety as above.       Relevant Medications   traZODone (DESYREL) 50 MG tablet   buPROPion (WELLBUTRIN XL) 150 MG 24 hr tablet   Insomnia    Chronic and uncontrolled As above, stopped trazodone as she wasn't sure if she could take it with the BuSpar We will resume at 50 to 100 mg nightly as needed Treatment of anxiety as above Decreasing Wellbutrin and getting off of prednisone may also be helpful Follow-up in 1 month      Prediabetes    Review recent lab work.  Continue to work on diet and exercise.  Recheck in 6 months.           Return in about 1 month (around 03/14/2020) for Anxiety.      I, Lavon Paganini, MD, have reviewed all documentation for this visit. The documentation on 02/13/20 for the exam, diagnosis, procedures, and orders are all accurate and complete.   Adi Doro, Dionne Bucy, MD, MPH [facility]

## 2020-02-16 ENCOUNTER — Encounter: Payer: Self-pay | Admitting: Family Medicine

## 2020-02-16 ENCOUNTER — Ambulatory Visit (INDEPENDENT_AMBULATORY_CARE_PROVIDER_SITE_OTHER): Payer: Medicare Other | Admitting: Family Medicine

## 2020-02-16 DIAGNOSIS — G4733 Obstructive sleep apnea (adult) (pediatric): Secondary | ICD-10-CM

## 2020-02-16 NOTE — Progress Notes (Signed)
Virtual telephone visit    Virtual Visit via Telephone Note   This visit type was conducted due to national recommendations for restrictions regarding the COVID-19 Pandemic (e.g. social distancing) in an effort to limit this patient's exposure and mitigate transmission in our community. Due to her co-morbid illnesses, this patient is at least at moderate risk for complications without adequate follow up. This format is felt to be most appropriate for this patient at this time. The patient did not have access to video technology or had technical difficulties with video requiring transitioning to audio format only (telephone). Physical exam was limited to content and character of the telephone converstion.     Patient location: home Provider location: [place] involved in the visit: patient, provider   Visit Date: 02/16/2020  Today's healthcare provider: Lavon Paganini, MD   Chief Complaint  Patient presents with  . Sleep Apnea   Subjective    HPI Patient reports good compliance and tolerance. She reports using every night for about 9 hours. Patient reports sleeping well and waking up well rested. Patient denies any chills, sweats, shortness of breath, or headaches.    Does occasionally wake overnight have to remove the CPAP intermittently  Social History   Tobacco Use  . Smoking status: Former Smoker    Packs/day: 1.00    Years: 30.00    Pack years: 30.00    Types: Cigarettes    Quit date: 10/15/1996    Years since quitting: 23.3  . Smokeless tobacco: Never Used  Substance Use Topics  . Alcohol use: Yes    Comment: wine rarely  . Drug use: No      Medications: Outpatient Medications Prior to Visit  Medication Sig  . aspirin EC 81 MG tablet Take 81 mg by mouth daily.  Marland Kitchen buPROPion (WELLBUTRIN XL) 150 MG 24 hr tablet Take 1 tablet (150 mg total) by mouth daily.  . busPIRone (BUSPAR) 7.5 MG tablet Take 1 tablet (7.5 mg total) by mouth  2 (two) times daily.  . calcium carbonate (TUMS - DOSED IN MG ELEMENTAL CALCIUM) 500 MG chewable tablet Chew 1 tablet by mouth 3 (three) times daily with meals.  . cholecalciferol (VITAMIN D) 1000 units tablet Take 1,000 Units by mouth daily.  . colestipol (COLESTID) 1 g tablet Take 1 g by mouth daily.   . DULoxetine (CYMBALTA) 60 MG capsule TAKE 1 CAPSULE BY MOUTH  DAILY  . Fluticasone-Umeclidin-Vilant (TRELEGY ELLIPTA) 100-62.5-25 MCG/INH AEPB Inhale 1 puff into the lungs daily.   . hydrochlorothiazide (HYDRODIURIL) 25 MG tablet TAKE 1 TABLET BY MOUTH  DAILY  . losartan (COZAAR) 50 MG tablet TAKE 1 TABLET BY MOUTH  DAILY  . meclizine (ANTIVERT) 25 MG tablet Take 25 mg by mouth 3 (three) times daily as needed for dizziness.  . rosuvastatin (CRESTOR) 5 MG tablet Take 5 mg by mouth 3 (three) times a week.  . traZODone (DESYREL) 50 MG tablet Take 1-2 tablets at bedtime for sleep.  Marland Kitchen triamcinolone ointment (KENALOG) 0.1 % APPLY TO AFFECTED AREAS ON ARMS TWICE DAILY AS NEEDED   No facility-administered medications prior to visit.    Review of Systems  Constitutional: Negative for chills, diaphoresis and fatigue.  HENT: Negative for rhinorrhea, sinus pressure, sinus pain and sore throat.   Respiratory: Negative for apnea, choking, chest tightness, shortness of breath and wheezing.   Cardiovascular: Negative for chest pain and palpitations.  Neurological: Negative for dizziness and headaches.  Psychiatric/Behavioral: Negative for sleep disturbance.  Objective    Pulse 85   SpO2 95%  BP Readings from Last 3 Encounters:  02/13/20 118/68  01/30/20 136/78  09/29/19 130/64   Wt Readings from Last 3 Encounters:  02/13/20 214 lb (97.1 kg)  01/30/20 214 lb 6.4 oz (97.3 kg)  09/29/19 219 lb (99.3 kg)     Speaking in full sentences in NAD   Assessment & Plan     Problem List Items Addressed This Visit      Respiratory   OSA (obstructive sleep apnea)    Well-controlled Using CPAP  with good compliance Will complete insurance form regarding this Virtual face-to-face completed today          Return if symptoms worsen or fail to improve.    I discussed the assessment and treatment plan with the patient. The patient was provided an opportunity to ask questions and all were answered. The patient agreed with the plan and demonstrated an understanding of the instructions.   The patient was advised to call back or seek an in-person evaluation if the symptoms worsen or if the condition fails to improve as anticipated.   I, Lavon Paganini, MD, have reviewed all documentation for this visit. The documentation on 02/16/20 for the exam, diagnosis, procedures, and orders are all accurate and complete.   Joeann Steppe, Dionne Bucy, MD, MPH [facility]

## 2020-02-16 NOTE — Assessment & Plan Note (Signed)
Well-controlled Using CPAP with good compliance Will complete insurance form regarding this Virtual face-to-face completed today

## 2020-02-18 DIAGNOSIS — Z9889 Other specified postprocedural states: Secondary | ICD-10-CM | POA: Diagnosis not present

## 2020-02-18 DIAGNOSIS — I350 Nonrheumatic aortic (valve) stenosis: Secondary | ICD-10-CM | POA: Diagnosis not present

## 2020-02-18 DIAGNOSIS — R0600 Dyspnea, unspecified: Secondary | ICD-10-CM | POA: Diagnosis not present

## 2020-02-18 DIAGNOSIS — R0789 Other chest pain: Secondary | ICD-10-CM | POA: Diagnosis not present

## 2020-02-18 DIAGNOSIS — Z9989 Dependence on other enabling machines and devices: Secondary | ICD-10-CM | POA: Diagnosis not present

## 2020-02-18 DIAGNOSIS — E782 Mixed hyperlipidemia: Secondary | ICD-10-CM | POA: Diagnosis not present

## 2020-02-18 DIAGNOSIS — I10 Essential (primary) hypertension: Secondary | ICD-10-CM | POA: Diagnosis not present

## 2020-02-18 DIAGNOSIS — G4733 Obstructive sleep apnea (adult) (pediatric): Secondary | ICD-10-CM | POA: Diagnosis not present

## 2020-02-23 DIAGNOSIS — Z01818 Encounter for other preprocedural examination: Secondary | ICD-10-CM | POA: Diagnosis not present

## 2020-02-26 DIAGNOSIS — C44519 Basal cell carcinoma of skin of other part of trunk: Secondary | ICD-10-CM | POA: Diagnosis not present

## 2020-03-12 ENCOUNTER — Ambulatory Visit (INDEPENDENT_AMBULATORY_CARE_PROVIDER_SITE_OTHER): Payer: Medicare Other | Admitting: Family Medicine

## 2020-03-12 ENCOUNTER — Other Ambulatory Visit: Payer: Self-pay

## 2020-03-12 ENCOUNTER — Encounter: Payer: Self-pay | Admitting: Family Medicine

## 2020-03-12 VITALS — BP 139/66 | HR 65 | Temp 97.5°F | Resp 16 | Ht 61.0 in | Wt 214.8 lb

## 2020-03-12 DIAGNOSIS — J069 Acute upper respiratory infection, unspecified: Secondary | ICD-10-CM | POA: Diagnosis not present

## 2020-03-12 DIAGNOSIS — F419 Anxiety disorder, unspecified: Secondary | ICD-10-CM

## 2020-03-12 NOTE — Progress Notes (Signed)
Established patient visit   Patient: Marcia Livingston   DOB: [birth year]   74 y.o. Female  MRN: [id number] Visit Date: 03/12/2020  I,Sulibeya S Dimas,acting as a scribe for Lavon Paganini, MD.,have documented all relevant documentation on the behalf of Lavon Paganini, MD,as directed by  Lavon Paganini, MD while in the presence of Lavon Paganini, MD.  Today's healthcare provider: Lavon Paganini, MD   Chief Complaint  Patient presents with  . Anxiety   Subjective    Anxiety Presents for follow-up visit. Symptoms include decreased concentration, depressed mood, dizziness, dry mouth, feeling of choking, irritability, malaise, muscle tension, nausea, nervous/anxious behavior and shortness of breath. Patient reports no chest pain, compulsions, confusion, excessive worry, hyperventilation, insomnia, obsessions, palpitations, panic, restlessness or suicidal ideas. Symptoms occur most days. The severity of symptoms is mild. The quality of sleep is good. Nighttime awakenings: one to two.   Compliance with medications is 76-100%. Side effects of treatment include GI discomfort.     Patient Active Problem List   Diagnosis Date Noted  . Skin cancer 01/30/2020  . Morbid obesity ([place]) 08/15/2019  . Prediabetes 02/14/2019  . Allergic rhinitis 07/04/2018  . OSA (obstructive sleep apnea) 01/16/2018  . GERD (gastroesophageal reflux disease) 01/16/2018  . Insomnia 01/16/2018  . Mild aortic stenosis 01/16/2018  . Angina pectoris ([place]) 10/22/2015  . HTN (hypertension) 10/22/2015  . High cholesterol 10/22/2015  . Anxiety 10/22/2015  . Depression 10/22/2015  . CAD (coronary artery disease) 10/22/2015  . H/O cardiac catheterization 06/02/2014   Social History   Tobacco Use  . Smoking status: Former Smoker    Packs/day: 1.00    Years: 30.00    Pack years: 30.00    Types: Cigarettes    Quit date: 10/15/1996    Years since quitting: 23.4  . Smokeless tobacco: Never Used    Substance Use Topics  . Alcohol use: Yes    Comment: wine rarely  . Drug use: No       Medications: Outpatient Medications Prior to Visit  Medication Sig  . aspirin EC 81 MG tablet Take 81 mg by mouth daily.  Marland Kitchen buPROPion (WELLBUTRIN XL) 150 MG 24 hr tablet Take 1 tablet (150 mg total) by mouth daily.  . busPIRone (BUSPAR) 7.5 MG tablet Take 1 tablet (7.5 mg total) by mouth 2 (two) times daily.  . calcium carbonate (TUMS - DOSED IN MG ELEMENTAL CALCIUM) 500 MG chewable tablet Chew 1 tablet by mouth 3 (three) times daily with meals.  . cholecalciferol (VITAMIN D) 1000 units tablet Take 1,000 Units by mouth daily.  . colestipol (COLESTID) 1 g tablet Take 1 g by mouth daily.   . DULoxetine (CYMBALTA) 60 MG capsule TAKE 1 CAPSULE BY MOUTH  DAILY  . Fluticasone-Umeclidin-Vilant (TRELEGY ELLIPTA) 100-62.5-25 MCG/INH AEPB Inhale 1 puff into the lungs daily.   . hydrochlorothiazide (HYDRODIURIL) 25 MG tablet TAKE 1 TABLET BY MOUTH  DAILY  . losartan (COZAAR) 50 MG tablet TAKE 1 TABLET BY MOUTH  DAILY  . meclizine (ANTIVERT) 25 MG tablet Take 25 mg by mouth 3 (three) times daily as needed for dizziness.  . rosuvastatin (CRESTOR) 5 MG tablet Take 5 mg by mouth 3 (three) times a week.  . traZODone (DESYREL) 50 MG tablet Take 1-2 tablets at bedtime for sleep.  Marland Kitchen triamcinolone ointment (KENALOG) 0.1 % APPLY TO AFFECTED AREAS ON ARMS TWICE DAILY AS NEEDED   No facility-administered medications prior to visit.    Review of Systems  Constitutional:  Positive for irritability. Negative for appetite change, chills and fever.  HENT: Positive for sinus pressure, sinus pain, sneezing and sore throat. Negative for ear pain, postnasal drip and rhinorrhea.   Respiratory: Positive for cough, choking and shortness of breath. Negative for wheezing.   Cardiovascular: Negative for chest pain and palpitations.  Gastrointestinal: Positive for nausea.  Neurological: Positive for dizziness.   Psychiatric/Behavioral: Positive for decreased concentration. Negative for confusion and suicidal ideas. The patient is nervous/anxious. The patient does not have insomnia.       Objective    BP 139/66   Pulse 65   Temp (!) 97.5 F (36.4 C) (Temporal)   Resp 16   Ht 5\' 1"  (1.549 m)   Wt 214 lb 12.8 oz (97.4 kg)   SpO2 97%   BMI 40.59 kg/m    Physical Exam Vitals reviewed.  Constitutional:      General: She is not in acute distress.    Appearance: Normal appearance. She is well-developed. She is not diaphoretic.  HENT:     Head: Normocephalic and atraumatic.  Eyes:     General: No scleral icterus.    Conjunctiva/sclera: Conjunctivae normal.  Neck:     Thyroid: No thyromegaly.  Cardiovascular:     Rate and Rhythm: Normal rate and regular rhythm.     Pulses: Normal pulses.     Heart sounds: Murmur present.  Pulmonary:     Effort: Pulmonary effort is normal. No respiratory distress.     Breath sounds: Normal breath sounds. No wheezing, rhonchi or rales.  Musculoskeletal:     Cervical back: Neck supple.     Right lower leg: No edema.     Left lower leg: No edema.  Lymphadenopathy:     Cervical: No cervical adenopathy.  Skin:    General: Skin is warm and dry.     Findings: No rash.  Neurological:     Mental Status: She is alert and oriented to person, place, and time. Mental status is at baseline.  Psychiatric:        Mood and Affect: Mood normal.        Behavior: Behavior normal.      No results found for any visits on 03/12/20.  Assessment & Plan     1. Anxiety Improved on current medications Continue medications on current dose Follow up October Recheck PHQ-9 and GAD-7  2. Viral URI - symptoms and exam c/w viral URI - no evidence of strep pharyngitis, CAP, AOM, bacterial sinusitis, or other bacterial infection - discussed symptomatic management, natural course, and return precautions   Return in about 5 months (around 08/12/2020) for CPE, AWV,  chronic disease f/u.      I, Lavon Paganini, MD, have reviewed all documentation for this visit. The documentation on 03/12/20 for the exam, diagnosis, procedures, and orders are all accurate and complete.   Anandi Abramo, Dionne Bucy, MD, MPH [facility]

## 2020-03-14 DIAGNOSIS — J40 Bronchitis, not specified as acute or chronic: Secondary | ICD-10-CM | POA: Diagnosis not present

## 2020-03-14 DIAGNOSIS — Z03818 Encounter for observation for suspected exposure to other biological agents ruled out: Secondary | ICD-10-CM | POA: Diagnosis not present

## 2020-03-14 DIAGNOSIS — R05 Cough: Secondary | ICD-10-CM | POA: Diagnosis not present

## 2020-03-17 ENCOUNTER — Ambulatory Visit: Payer: Medicare Other | Admitting: Family Medicine

## 2020-03-24 ENCOUNTER — Telehealth: Payer: Self-pay

## 2020-03-24 DIAGNOSIS — K580 Irritable bowel syndrome with diarrhea: Secondary | ICD-10-CM

## 2020-03-24 NOTE — Telephone Encounter (Signed)
Copied from [place] [id number]. Topic: General - Other >> Mar 24, 2020 11:46 AM Hinda Lenis D wrote: Reason for CRM: PT daughter Bernardo Heater [phone] / wants her mom to be refer to a Gastro Dr / please advise

## 2020-03-24 NOTE — Telephone Encounter (Signed)
I spoke with Ms Lisabeth Pick, she states pt has a long standing issue with IBS-D and used to see Dr. Vira Agar. Since Dr. Vira Agar has retired she would like to establish care with Dr. Vicente Males at [facility].   Thanks,   -Mickel Baas

## 2020-03-24 NOTE — Telephone Encounter (Signed)
Ok to place referral.

## 2020-03-24 NOTE — Addendum Note (Signed)
Addended by: Ashley Royalty E on: 03/24/2020 02:43 PM   Modules accepted: Orders

## 2020-04-27 ENCOUNTER — Other Ambulatory Visit: Payer: Self-pay | Admitting: Physician Assistant

## 2020-04-27 DIAGNOSIS — F419 Anxiety disorder, unspecified: Secondary | ICD-10-CM

## 2020-04-27 NOTE — Telephone Encounter (Signed)
Rx request sent for review: Rx has dosage change with new directions- not listed in medication list that way. Sent for review of changes.

## 2020-04-28 ENCOUNTER — Encounter: Payer: Self-pay | Admitting: Family Medicine

## 2020-04-30 ENCOUNTER — Ambulatory Visit
Admission: EM | Admit: 2020-04-30 | Discharge: 2020-04-30 | Disposition: A | Discharge status: Home / Self Care | Payer: Medicare Other | Attending: Family Medicine | Admitting: Family Medicine

## 2020-04-30 ENCOUNTER — Other Ambulatory Visit: Payer: Self-pay

## 2020-04-30 DIAGNOSIS — R509 Fever, unspecified: Secondary | ICD-10-CM | POA: Diagnosis not present

## 2020-04-30 DIAGNOSIS — R519 Headache, unspecified: Secondary | ICD-10-CM | POA: Diagnosis not present

## 2020-04-30 DIAGNOSIS — R05 Cough: Secondary | ICD-10-CM

## 2020-04-30 DIAGNOSIS — R059 Cough, unspecified: Secondary | ICD-10-CM

## 2020-04-30 DIAGNOSIS — J209 Acute bronchitis, unspecified: Secondary | ICD-10-CM | POA: Diagnosis not present

## 2020-04-30 MED ORDER — DEXAMETHASONE SODIUM PHOSPHATE 10 MG/ML IJ SOLN
10.0000 mg | Freq: Once | INTRAMUSCULAR | Status: AC
  Administered 2020-04-30: 10 mg via INTRAMUSCULAR

## 2020-04-30 MED ORDER — DOXYCYCLINE HYCLATE 100 MG PO CAPS
100.0000 mg | ORAL_CAPSULE | Freq: Two times a day (BID) | ORAL | 0 refills | Status: DC
Start: 2020-04-30 — End: 2020-05-19

## 2020-04-30 MED ORDER — PREDNISONE 10 MG (21) PO TBPK: ORAL_TABLET | Freq: Every day | ORAL | 0 refills | Status: AC

## 2020-04-30 NOTE — Discharge Instructions (Addendum)
Your COVID test is pending.  You should self quarantine until the test result is back.    Take Tylenol as needed for fever or discomfort.  Rest and keep yourself hydrated.    Go to the emergency department if you develop shortness of breath, severe diarrhea, high fever not relieved by Tylenol or ibuprofen, or other concerning symptoms.    If your Covid test is negative, then I would like you to pick up the antibiotic that I sent to  your pharmacy.  I have sent in doxycycline for you to take twice daily for 7 days.

## 2020-04-30 NOTE — ED Triage Notes (Signed)
Pt presents with productive cough, thick green mucous x 5 days.  Intermittent low grade temp.  Reports feeling SOB, feels like lungs are raw from coughing.   Took Tylenol and Ibuprofen this morning.   Has had COVID vaccines.

## 2020-04-30 NOTE — ED Provider Notes (Signed)
[place]   [id number] 04/30/20 Arrival Time: 8563   CC: COVID symptoms  SUBJECTIVE: History from: patient.  Marcia Livingston is a 74 y.o. female who presents with abrupt onset of nasal congestion, PND, fever, headache, SOB. Has hx COPD and has had both vaccines. Reports that her daughter has the same sort of illness, and that she works at the [employer] infusion clinic.  Reports that daughter tested negative for Covid.  Denies recent travel. Has not taken OTC medications for this. There are no aggravating symptoms.  There are also no alleviating symptoms. Denies sinus pain, rhinorrhea, sore throat, wheezing, chest pain, nausea, changes in bowel or bladder habits.    ROS: As per HPI.  All other pertinent ROS negative.     Past Medical History:  Diagnosis Date  . Anxiety   . Arthritis   . CAD (coronary artery disease)   . Cancer ([place])    SKIN  . Colon polyps   . COPD (chronic obstructive pulmonary disease) ([place])   . Depression   . Depression   . Fibrocystic breast disease   . GERD (gastroesophageal reflux disease)   . Heart murmur   . High cholesterol   . Hyperlipidemia   . Hypertension   . IBS (irritable bowel syndrome)   . IBS (irritable bowel syndrome)   . Obesity   . Osteoporosis   . Overactive bladder   . Sleep apnea   . Sleep apnea    Past Surgical History:  Procedure Laterality Date  . ABDOMINAL HYSTERECTOMY     total  . APPENDECTOMY    . BREAST EXCISIONAL BIOPSY Right YRS AGO   NEG  . CATARACT EXTRACTION W/PHACO Right 02/01/2016   Procedure: CATARACT EXTRACTION PHACO AND INTRAOCULAR LENS PLACEMENT (IOC);  Surgeon: Birder Robson, MD;  Location: [facility] ORS;  Service: Ophthalmology;  Laterality: Right;  [place]    00:37.2 AP%  20.7% CDE  7.69 fluid pack lot # [id number]  . CATARACT EXTRACTION W/PHACO Left 02/22/2016   Procedure: CATARACT EXTRACTION PHACO AND INTRAOCULAR LENS PLACEMENT (IOC);  Surgeon: Birder Robson, MD;  Location: [facility] ORS;  Service:  Ophthalmology;  Laterality: Left;  [place] 00:40 AP% 19.9 CDE 8.06 fluid pack lot # [id number]  . CHOLECYSTECTOMY    . COLONOSCOPY WITH PROPOFOL N/A 01/13/2016   Procedure: COLONOSCOPY WITH PROPOFOL;  Surgeon: Manya Silvas, MD;  [facility] END[facility];  Service: Endoscopy;  Laterality: N/A;  . FOOT SURGERY Left    for breakdown of arch  . OVARIAN CYST SURGERY    . SKIN CANCER EXCISION Right 05/16/2017   Right Shin   Allergies  Allergen Reactions  . Penicillins Rash    Has patient had a PCN reaction causing immediate rash, facial/tongue/throat swelling, SOB or lightheadedness with hypotension: [id number]} Has patient had a PCN reaction causing severe rash involving mucus membranes or skin necrosis: no:[id number]} Has patient had a PCN reaction that required hospitalization no:[id number]} Has patient had a PCN reaction occurring within the last 10 years: no:[id number]} If all of the above answers are "NO", then may proceed with Cephalosporin use.   . Codeine Hives  . Sulfa Antibiotics Hives   No current facility-administered medications on file prior to encounter.   Current Outpatient Medications on File Prior to Encounter  Medication Sig Dispense Refill  . aspirin EC 81 MG tablet Take 81 mg by mouth daily.    Marland Kitchen buPROPion (WELLBUTRIN XL) 150 MG 24 hr tablet Take 1 tablet (150 mg total) by mouth daily. 90 tablet  1  . busPIRone (BUSPAR) 15 MG tablet TAKE 1/2 TABLET (7.5 MG TOTAL) BY MOUTH TWICE DAILY 90 tablet 1  . calcium carbonate (TUMS - DOSED IN MG ELEMENTAL CALCIUM) 500 MG chewable tablet Chew 1 tablet by mouth 3 (three) times daily with meals.    . cholecalciferol (VITAMIN D) 1000 units tablet Take 1,000 Units by mouth daily.    . colestipol (COLESTID) 1 g tablet Take 1 g by mouth daily.     . DULoxetine (CYMBALTA) 60 MG capsule TAKE 1 CAPSULE BY MOUTH  DAILY 90 capsule 3  . Fluticasone-Umeclidin-Vilant (TRELEGY ELLIPTA) 100-62.5-25 MCG/INH AEPB Inhale 1 puff into the lungs daily.       . hydrochlorothiazide (HYDRODIURIL) 25 MG tablet TAKE 1 TABLET BY MOUTH  DAILY 90 tablet 3  . losartan (COZAAR) 50 MG tablet TAKE 1 TABLET BY MOUTH  DAILY 90 tablet 3  . meclizine (ANTIVERT) 25 MG tablet Take 25 mg by mouth 3 (three) times daily as needed for dizziness.    . rosuvastatin (CRESTOR) 5 MG tablet Take 5 mg by mouth 3 (three) times a week.    . traZODone (DESYREL) 50 MG tablet Take 1-2 tablets at bedtime for sleep. 180 tablet 1  . triamcinolone ointment (KENALOG) 0.1 % APPLY TO AFFECTED AREAS ON ARMS TWICE DAILY AS NEEDED     Social History   Socioeconomic History  . Marital status: Married    Spouse name: Orpah Cobb. [place]  . Number of children: 2  . Years of education: 38  . Highest education level: High school graduate  Occupational History  . Occupation: Retired    Comment: book-keeper  Tobacco Use  . Smoking status: Former Smoker    Packs/day: 1.00    Years: 30.00    Pack years: 30.00    Types: Cigarettes    Quit date: 10/15/1996    Years since quitting: 23.5  . Smokeless tobacco: Never Used  Vaping Use  . Vaping Use: Never used  Substance and Sexual Activity  . Alcohol use: Yes    Comment: wine rarely  . Drug use: No  . Sexual activity: Not Currently    Partners: Male    Birth control/protection: Surgical  Other Topics Concern  . Not on file  Social History Narrative   Pt states she has raised her granddaughter, and considers the granddaughter her child.   Social Determinants of Health   Financial Resource Strain:   . Difficulty of Paying Living Expenses:   Food Insecurity:   . Worried About Charity fundraiser in the Last Year:   . Arboriculturist in the Last Year:   Transportation Needs:   . Film/video editor (Medical):   Marland Kitchen Lack of Transportation (Non-Medical):   Physical Activity:   . Days of Exercise per Week:   . Minutes of Exercise per Session:   Stress: Stress Concern Present  . Feeling of Stress : To some extent  Social Connections:    . Frequency of Communication with Friends and Family:   . Frequency of Social Gatherings with Friends and Family:   . Attends Religious Services:   . Active Member of Clubs or Organizations:   . Attends Archivist Meetings:   Marland Kitchen Marital Status:   Intimate Partner Violence:   . Fear of Current or Ex-Partner:   . Emotionally Abused:   Marland Kitchen Physically Abused:   . Sexually Abused:    Family History  Problem Relation Age of Onset  .  Colon cancer Mother        thought to be mets from breast cancer  . Stroke Mother   . Hypertension Mother   . Heart attack Mother   . Breast cancer Mother 71  . Emphysema Father   . Prostate cancer Father   . Throat cancer Daughter   . Breast cancer Maternal Aunt        70's  . Rheum arthritis Daughter   . Hypertension Daughter   . Reye's syndrome Daughter        history of  . Hepatitis C Daughter   . Ovarian cancer Neg Hx     OBJECTIVE:  Vitals:   04/30/20 1200  BP: (!) 141/63  Pulse: 72  Resp: 20  Temp: 98.7 F (37.1 C)  TempSrc: Oral  SpO2: 94%     General appearance: alert; appears fatigued, but nontoxic; speaking in full sentences and tolerating own secretions HEENT: NCAT; Ears: EACs clear, TMs pearly gray; Eyes: PERRL.  EOM grossly intact. Sinuses: Mildly tender; Nose: nares patent without rhinorrhea, Throat: oropharynx clear, tonsils non erythematous or enlarged, uvula midline  Neck: supple without LAD Lungs: unlabored respirations, symmetrical air entry; cough: moderate; no respiratory distress; diffuse wheezing to bilateral lower lung fields Heart: regular rate and rhythm.  Radial pulses 2+ symmetrical bilaterally Skin: warm and dry Psychological: alert and cooperative; normal mood and affect  LABS:  No results found for this or any previous visit (from the past 24 hour(s)).   ASSESSMENT & PLAN:  1. Acute bronchitis, unspecified organism   2. Cough   3. Fever, unspecified fever cause   4. Nonintractable headache,  unspecified chronicity pattern, unspecified headache type     Meds ordered this encounter  Medications  . dexamethasone (DECADRON) injection 10 mg  . predniSONE (STERAPRED UNI-PAK 21 TAB) 10 MG (21) TBPK tablet    Sig: Take by mouth daily for 6 days. Take 6 tablets on day 1, 5 tablets on day 2, 4 tablets on day 3, 3 tablets on day 4, 2 tablets on day 5, 1 tablet on day 6    Dispense:  21 tablet    Refill:  0    Order Specific Question:   Supervising Provider    Answer:   Chase Picket [id number]  . doxycycline (VIBRAMYCIN) 100 MG capsule    Sig: Take 1 capsule (100 mg total) by mouth 2 (two) times daily.    Dispense:  14 capsule    Refill:  0    Order Specific Question:   Supervising Provider    Answer:   Chase Picket [[id number]]   Received Decadron 10 mg IM in office today Prescribed doxycycline Prescribed prednisone taper  If Covid negative testing is negative, take the antibiotic and we will treat as a COPD exacerbation COVID testing ordered.  It will take between 1-2 days for test results.  Someone will contact you regarding abnormal results.    Patient should remain in quarantine until they have received Covid results.  If negative you may resume normal activities (go back to work/school) while practicing hand hygiene, social distance, and mask wearing.  If positive, patient should remain in quarantine for 10 days from symptom onset AND greater than 72 hours after symptoms resolution (absence of fever without the use of fever-reducing medication and improvement in respiratory symptoms), whichever is longer Get plenty of rest and push fluids Use OTC zyrtec for nasal congestion, runny nose, and/or sore throat Use OTC flonase for nasal congestion  and runny nose Use medications daily for symptom relief Use OTC medications like ibuprofen or tylenol as needed fever or pain Call or go to the ED if you have any new or worsening symptoms such as fever, worsening cough, shortness of  breath, chest tightness, chest pain, turning blue, changes in mental status.  Reviewed expectations re: course of current medical issues. Questions answered. Outlined signs and symptoms indicating need for more acute intervention. Patient verbalized understanding. After Visit Summary given.         Faustino Congress, NP 04/30/20 1232

## 2020-05-01 LAB — NOVEL CORONAVIRUS, NAA: SARS-CoV-2, NAA: NOT DETECTED

## 2020-05-01 LAB — SARS-COV-2, NAA 2 DAY TAT

## 2020-05-03 ENCOUNTER — Ambulatory Visit: Payer: Medicare Other | Admitting: Gastroenterology

## 2020-05-19 ENCOUNTER — Encounter: Payer: Self-pay | Admitting: Podiatry

## 2020-05-19 ENCOUNTER — Ambulatory Visit (INDEPENDENT_AMBULATORY_CARE_PROVIDER_SITE_OTHER): Payer: Medicare Other | Admitting: Podiatry

## 2020-05-19 ENCOUNTER — Ambulatory Visit (INDEPENDENT_AMBULATORY_CARE_PROVIDER_SITE_OTHER): Payer: Medicare Other

## 2020-05-19 ENCOUNTER — Other Ambulatory Visit: Payer: Self-pay

## 2020-05-19 DIAGNOSIS — S90121A Contusion of right lesser toe(s) without damage to nail, initial encounter: Secondary | ICD-10-CM

## 2020-05-19 DIAGNOSIS — S92501A Displaced unspecified fracture of right lesser toe(s), initial encounter for closed fracture: Secondary | ICD-10-CM

## 2020-05-19 DIAGNOSIS — G609 Hereditary and idiopathic neuropathy, unspecified: Secondary | ICD-10-CM | POA: Diagnosis not present

## 2020-05-19 MED ORDER — GABAPENTIN 100 MG PO CAPS
100.0000 mg | ORAL_CAPSULE | Freq: Every day | ORAL | 3 refills | Status: DC
Start: 2020-05-19 — End: 2020-06-23

## 2020-05-19 NOTE — Progress Notes (Signed)
Subjective:  Patient ID: Marcia Livingston, female    DOB: [birth year],  MRN: [id number] HPI Chief Complaint  Patient presents with  . Toe Pain    "I hit my toes on a basket about 1 week ago and now they hurt"  She reports hitting right toes 3,4,5, had some swelling, no bruising.  Now 5th toe is painful, radiates into lateral side foot.  She has elevated and iced for relief    74 y.o. female presents with the above complaint.   ROS: Denies fever chills nausea vomiting muscle aches pains calf pain back pain chest pain shortness of breath.  Relates numbness and tingling at nighttime.  Past Medical History:  Diagnosis Date  . Anxiety   . Arthritis   . CAD (coronary artery disease)   . Cancer ([place])    SKIN  . Colon polyps   . COPD (chronic obstructive pulmonary disease) ([place])   . Depression   . Depression   . Fibrocystic breast disease   . GERD (gastroesophageal reflux disease)   . Heart murmur   . High cholesterol   . Hyperlipidemia   . Hypertension   . IBS (irritable bowel syndrome)   . IBS (irritable bowel syndrome)   . Obesity   . Osteoporosis   . Overactive bladder   . Sleep apnea   . Sleep apnea    Past Surgical History:  Procedure Laterality Date  . ABDOMINAL HYSTERECTOMY     total  . APPENDECTOMY    . BREAST EXCISIONAL BIOPSY Right YRS AGO   NEG  . CATARACT EXTRACTION W/PHACO Right 02/01/2016   Procedure: CATARACT EXTRACTION PHACO AND INTRAOCULAR LENS PLACEMENT (IOC);  Surgeon: Birder Robson, MD;  Location: [facility] ORS;  Service: Ophthalmology;  Laterality: Right;  [place]    00:37.2 AP%  20.7% CDE  7.69 fluid pack lot # [id number]  . CATARACT EXTRACTION W/PHACO Left 02/22/2016   Procedure: CATARACT EXTRACTION PHACO AND INTRAOCULAR LENS PLACEMENT (IOC);  Surgeon: Birder Robson, MD;  Location: [facility] ORS;  Service: Ophthalmology;  Laterality: Left;  [place] 00:40 AP% 19.9 CDE 8.06 fluid pack lot # [id number]  . CHOLECYSTECTOMY    . COLONOSCOPY WITH PROPOFOL N/A 01/13/2016    Procedure: COLONOSCOPY WITH PROPOFOL;  Surgeon: Manya Silvas, MD;  Location: [facility] ENDOSCOPY;  Service: Endoscopy;  Laterality: N/A;  . FOOT SURGERY Left    for breakdown of arch  . OVARIAN CYST SURGERY    . SKIN CANCER EXCISION Right 05/16/2017   Right Shin    Current Outpatient Medications:  .  albuterol (VENTOLIN HFA) 108 (90 Base) MCG/ACT inhaler, Inhale into the lungs., Disp: , Rfl:  .  aspirin EC 81 MG tablet, Take 81 mg by mouth daily., Disp: , Rfl:  .  buPROPion (WELLBUTRIN XL) 150 MG 24 hr tablet, Take 1 tablet (150 mg total) by mouth daily., Disp: 90 tablet, Rfl: 1 .  busPIRone (BUSPAR) 15 MG tablet, TAKE 1/2 TABLET (7.5 MG TOTAL) BY MOUTH TWICE DAILY, Disp: 90 tablet, Rfl: 1 .  calcium carbonate (TUMS - DOSED IN MG ELEMENTAL CALCIUM) 500 MG chewable tablet, Chew 1 tablet by mouth 3 (three) times daily with meals., Disp: , Rfl:  .  cholecalciferol (VITAMIN D) 1000 units tablet, Take 1,000 Units by mouth daily., Disp: , Rfl:  .  colestipol (COLESTID) 1 g tablet, Take 1 g by mouth daily. , Disp: , Rfl:  .  DULoxetine (CYMBALTA) 60 MG capsule, TAKE 1 CAPSULE BY MOUTH  DAILY, Disp: 90 capsule, Rfl:  3 .  Fluticasone-Umeclidin-Vilant (TRELEGY ELLIPTA) 100-62.5-25 MCG/INH AEPB, Inhale 1 puff into the lungs daily. , Disp: , Rfl:  .  gabapentin (NEURONTIN) 100 MG capsule, Take 1 capsule (100 mg total) by mouth at bedtime., Disp: 30 capsule, Rfl: 3 .  hydrochlorothiazide (HYDRODIURIL) 25 MG tablet, TAKE 1 TABLET BY MOUTH  DAILY, Disp: 90 tablet, Rfl: 3 .  losartan (COZAAR) 50 MG tablet, TAKE 1 TABLET BY MOUTH  DAILY, Disp: 90 tablet, Rfl: 3 .  meclizine (ANTIVERT) 25 MG tablet, Take 25 mg by mouth 3 (three) times daily as needed for dizziness., Disp: , Rfl:  .  rosuvastatin (CRESTOR) 5 MG tablet, Take 5 mg by mouth 3 (three) times a week., Disp: , Rfl:  .  traZODone (DESYREL) 50 MG tablet, Take 1-2 tablets at bedtime for sleep., Disp: 180 tablet, Rfl: 1 .  triamcinolone ointment (KENALOG)  0.1 %, APPLY TO AFFECTED AREAS ON ARMS TWICE DAILY AS NEEDED, Disp: , Rfl:   Allergies  Allergen Reactions  . Penicillins Rash    Has patient had a PCN reaction causing immediate rash, facial/tongue/throat swelling, SOB or lightheadedness with hypotension: [id number]} Has patient had a PCN reaction causing severe rash involving mucus membranes or skin necrosis: no:[id number]} Has patient had a PCN reaction that required hospitalization no:[id number]} Has patient had a PCN reaction occurring within the last 10 years: no:[id number]} If all of the above answers are "NO", then may proceed with Cephalosporin use.   . Codeine Hives  . Sulfa Antibiotics Hives   Review of Systems Objective:  There were no vitals filed for this visit.  General: Well developed, nourished, in no acute distress, alert and oriented x3   Dermatological: Skin is warm, dry and supple bilateral. Nails x 10 are well maintained; remaining integument appears unremarkable at this time. There are no open sores, no preulcerative lesions, no rash or signs of infection present.  Vascular: Dorsalis Pedis artery and Posterior Tibial artery pedal pulses are 2/4 bilateral with immedate capillary fill time. Pedal hair growth present. No varicosities and no lower extremity edema present bilateral.   Neruologic: Grossly intact via light touch bilateral. Vibratory intact via tuning fork bilateral. Protective threshold with Semmes Wienstein monofilament intact to all pedal sites bilateral. Patellar and Achilles deep tendon reflexes 2+ bilateral. No Babinski or clonus noted bilateral.   Musculoskeletal: No gross boney pedal deformities bilateral. No pain, crepitus, or limitation noted with foot and ankle range of motion bilateral. Muscular strength 5/5 in all groups tested bilateral.  Painful swollen fifth digit right foot mild erythema no cellulitis drainage or odor.  Gait: Unassisted, Nonantalgic.    Radiographs:  Radiographs taken  today demonstrate a nondisplaced cortical fracture proximal phalanx fifth digit right foot.  No other acute findings.  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  Plan: Discussed etiology pathology conservative versus surgical therapies.  At this point her toe should going to heal without complication.  I did start her on gabapentin 100 mg 1 p.o. nightly but follow-up with her in 1 month for med check.     Karley Pho T. [place], [place]

## 2020-05-27 DIAGNOSIS — Z03818 Encounter for observation for suspected exposure to other biological agents ruled out: Secondary | ICD-10-CM | POA: Diagnosis not present

## 2020-05-27 DIAGNOSIS — R0989 Other specified symptoms and signs involving the circulatory and respiratory systems: Secondary | ICD-10-CM | POA: Diagnosis not present

## 2020-05-31 ENCOUNTER — Encounter: Payer: Self-pay | Admitting: Family Medicine

## 2020-06-01 MED ORDER — COLESTIPOL HCL 1 G PO TABS: 1.0000 g | ORAL_TABLET | Freq: Every day | ORAL | 1 refills | Status: DC

## 2020-06-02 ENCOUNTER — Other Ambulatory Visit: Payer: Self-pay | Admitting: Family Medicine

## 2020-06-02 NOTE — Telephone Encounter (Signed)
Requested Prescriptions  Pending Prescriptions Disp Refills  . DULoxetine (CYMBALTA) 60 MG capsule [Pharmacy Med Name: DULoxetine HCl 60 MG Oral Capsule Delayed Release Particles] 90 capsule 3    Sig: TAKE 1 CAPSULE BY MOUTH  DAILY     Psychiatry: Antidepressants - SNRI Failed - 06/02/2020  9:53 PM      Failed - Last BP in normal range    BP Readings from Last 1 Encounters:  04/30/20 (!) 141/63         Passed - Completed PHQ-2 or PHQ-9 in the last 360 days.      Passed - Valid encounter within last 6 months    Recent Outpatient Visits          2 months ago [facility] [place], Dionne Bucy, MD   3 months ago OSA (obstructive sleep apnea)   [facility], Dionne Bucy, MD   3 months ago Essential hypertension   [place], Dionne Bucy, MD   4 months ago [place], [place], [place]   8 months ago Scalp lump   [facility] [place], Dionne Bucy, MD

## 2020-06-03 DIAGNOSIS — I10 Essential (primary) hypertension: Secondary | ICD-10-CM | POA: Diagnosis not present

## 2020-06-03 DIAGNOSIS — I350 Nonrheumatic aortic (valve) stenosis: Secondary | ICD-10-CM | POA: Diagnosis not present

## 2020-06-03 DIAGNOSIS — R0600 Dyspnea, unspecified: Secondary | ICD-10-CM | POA: Diagnosis not present

## 2020-06-03 DIAGNOSIS — Z9989 Dependence on other enabling machines and devices: Secondary | ICD-10-CM | POA: Diagnosis not present

## 2020-06-03 DIAGNOSIS — E782 Mixed hyperlipidemia: Secondary | ICD-10-CM | POA: Diagnosis not present

## 2020-06-03 DIAGNOSIS — G4733 Obstructive sleep apnea (adult) (pediatric): Secondary | ICD-10-CM | POA: Diagnosis not present

## 2020-06-03 DIAGNOSIS — Z9889 Other specified postprocedural states: Secondary | ICD-10-CM | POA: Diagnosis not present

## 2020-06-18 ENCOUNTER — Encounter: Payer: Self-pay | Admitting: Family Medicine

## 2020-06-18 DIAGNOSIS — Z780 Asymptomatic menopausal state: Secondary | ICD-10-CM

## 2020-06-18 DIAGNOSIS — M8580 Other specified disorders of bone density and structure, unspecified site: Secondary | ICD-10-CM

## 2020-06-23 ENCOUNTER — Encounter: Payer: Self-pay | Admitting: Podiatry

## 2020-06-23 ENCOUNTER — Ambulatory Visit (INDEPENDENT_AMBULATORY_CARE_PROVIDER_SITE_OTHER): Payer: Medicare Other

## 2020-06-23 ENCOUNTER — Ambulatory Visit (INDEPENDENT_AMBULATORY_CARE_PROVIDER_SITE_OTHER): Payer: Medicare Other | Admitting: Podiatry

## 2020-06-23 ENCOUNTER — Other Ambulatory Visit: Payer: Self-pay

## 2020-06-23 DIAGNOSIS — S92501D Displaced unspecified fracture of right lesser toe(s), subsequent encounter for fracture with routine healing: Secondary | ICD-10-CM

## 2020-06-23 DIAGNOSIS — G609 Hereditary and idiopathic neuropathy, unspecified: Secondary | ICD-10-CM

## 2020-06-23 MED ORDER — GABAPENTIN 300 MG PO CAPS: 300.0000 mg | ORAL_CAPSULE | Freq: Every day | ORAL | 3 refills | Status: DC

## 2020-06-23 NOTE — Progress Notes (Signed)
She presents today states the fifth toe of the right foot is feeling much better.  And she can tell a difference with her gabapentin.  She states that it does not seem to be quite strong enough but I can definitely tell a difference.  Objective: Vital signs are stable alert oriented x3.  Pulses are palpable.  She still has some swelling on palpation evaluation of the fifth toe right foot.  Radiographs taken today demonstrate bone callus healing the fifth toe proximal phalanx distally.  Assessment: Idiopathic neuropathy fracture fifth digit right.  Plan: At this point we will increase her gabapentin 300 mg just 1 p.o. nightly and I will follow-up with her in 2 months just for reevaluation of the medication.  We dispensed 90 pills with 3 refills.

## 2020-06-23 NOTE — Telephone Encounter (Signed)
Please order DEXA for osteopenia. Thanks

## 2020-06-24 ENCOUNTER — Other Ambulatory Visit: Payer: Self-pay | Admitting: Family Medicine

## 2020-07-01 DIAGNOSIS — J449 Chronic obstructive pulmonary disease, unspecified: Secondary | ICD-10-CM | POA: Diagnosis not present

## 2020-07-06 DIAGNOSIS — D2261 Melanocytic nevi of right upper limb, including shoulder: Secondary | ICD-10-CM | POA: Diagnosis not present

## 2020-07-06 DIAGNOSIS — L538 Other specified erythematous conditions: Secondary | ICD-10-CM | POA: Diagnosis not present

## 2020-07-06 DIAGNOSIS — Z85828 Personal history of other malignant neoplasm of skin: Secondary | ICD-10-CM | POA: Diagnosis not present

## 2020-07-06 DIAGNOSIS — D2271 Melanocytic nevi of right lower limb, including hip: Secondary | ICD-10-CM | POA: Diagnosis not present

## 2020-07-06 DIAGNOSIS — L309 Dermatitis, unspecified: Secondary | ICD-10-CM | POA: Diagnosis not present

## 2020-07-06 DIAGNOSIS — L820 Inflamed seborrheic keratosis: Secondary | ICD-10-CM | POA: Diagnosis not present

## 2020-07-06 DIAGNOSIS — D2262 Melanocytic nevi of left upper limb, including shoulder: Secondary | ICD-10-CM | POA: Diagnosis not present

## 2020-07-06 DIAGNOSIS — X32XXXA Exposure to sunlight, initial encounter: Secondary | ICD-10-CM | POA: Diagnosis not present

## 2020-07-06 DIAGNOSIS — L570 Actinic keratosis: Secondary | ICD-10-CM | POA: Diagnosis not present

## 2020-07-06 DIAGNOSIS — D225 Melanocytic nevi of trunk: Secondary | ICD-10-CM | POA: Diagnosis not present

## 2020-07-06 DIAGNOSIS — L308 Other specified dermatitis: Secondary | ICD-10-CM | POA: Diagnosis not present

## 2020-07-13 DIAGNOSIS — Z1231 Encounter for screening mammogram for malignant neoplasm of breast: Secondary | ICD-10-CM | POA: Diagnosis not present

## 2020-07-13 DIAGNOSIS — Z0000 Encounter for general adult medical examination without abnormal findings: Secondary | ICD-10-CM | POA: Diagnosis not present

## 2020-07-13 DIAGNOSIS — K219 Gastro-esophageal reflux disease without esophagitis: Secondary | ICD-10-CM | POA: Diagnosis not present

## 2020-07-13 DIAGNOSIS — F419 Anxiety disorder, unspecified: Secondary | ICD-10-CM | POA: Diagnosis not present

## 2020-07-13 DIAGNOSIS — G4733 Obstructive sleep apnea (adult) (pediatric): Secondary | ICD-10-CM | POA: Diagnosis not present

## 2020-07-13 DIAGNOSIS — K589 Irritable bowel syndrome without diarrhea: Secondary | ICD-10-CM | POA: Diagnosis not present

## 2020-07-13 DIAGNOSIS — R739 Hyperglycemia, unspecified: Secondary | ICD-10-CM | POA: Diagnosis not present

## 2020-07-13 DIAGNOSIS — I10 Essential (primary) hypertension: Secondary | ICD-10-CM | POA: Diagnosis not present

## 2020-07-13 DIAGNOSIS — Z9989 Dependence on other enabling machines and devices: Secondary | ICD-10-CM | POA: Diagnosis not present

## 2020-07-13 DIAGNOSIS — E782 Mixed hyperlipidemia: Secondary | ICD-10-CM | POA: Diagnosis not present

## 2020-07-13 DIAGNOSIS — F329 Major depressive disorder, single episode, unspecified: Secondary | ICD-10-CM | POA: Diagnosis not present

## 2020-07-13 DIAGNOSIS — J449 Chronic obstructive pulmonary disease, unspecified: Secondary | ICD-10-CM | POA: Diagnosis not present

## 2020-07-14 ENCOUNTER — Other Ambulatory Visit: Payer: Self-pay | Admitting: Internal Medicine

## 2020-07-14 DIAGNOSIS — Z1231 Encounter for screening mammogram for malignant neoplasm of breast: Secondary | ICD-10-CM

## 2020-08-02 ENCOUNTER — Encounter: Payer: Self-pay | Admitting: Podiatry

## 2020-08-02 ENCOUNTER — Other Ambulatory Visit: Payer: Self-pay

## 2020-08-02 ENCOUNTER — Ambulatory Visit (INDEPENDENT_AMBULATORY_CARE_PROVIDER_SITE_OTHER): Payer: Medicare Other

## 2020-08-02 ENCOUNTER — Ambulatory Visit (INDEPENDENT_AMBULATORY_CARE_PROVIDER_SITE_OTHER): Payer: Medicare Other | Admitting: Podiatry

## 2020-08-02 ENCOUNTER — Other Ambulatory Visit: Payer: Self-pay | Admitting: Podiatry

## 2020-08-02 DIAGNOSIS — S99922A Unspecified injury of left foot, initial encounter: Secondary | ICD-10-CM

## 2020-08-02 DIAGNOSIS — S92811A Other fracture of right foot, initial encounter for closed fracture: Secondary | ICD-10-CM

## 2020-08-02 DIAGNOSIS — S99921A Unspecified injury of right foot, initial encounter: Secondary | ICD-10-CM | POA: Diagnosis not present

## 2020-08-02 NOTE — Patient Instructions (Addendum)
Look for an "EvenUp" shoe attachment on [employer] or at [employer]. This will level out your hips while you are walking in the CAM boot. Wear this on the other foot around a supportive sneaker:      Sesamoid Injury  A sesamoid injury happens when a sesamoid bone or a surrounding tendon gets damaged during activity. A sesamoid bone is a bone that is connected to a tendon or a muscle, but not to a joint. There are sesamoid bones in your hands, knees, and feet. Your kneecap is an example of a sesamoid bone. Sesamoid injuries may include irritation, dislocation, or a break (fracture) in a sesamoid bone. The most common sesamoid injuries affect the sesamoid bones under the big toe. These bones help you move forward during weight-bearing activities. What are the causes? This condition is caused by damage to a sesamoid bone or a surrounding tendon. What increases the risk? This condition is more likely to develop in people who:  Dance and Architectural technologist.  Run.  Play sports.  Are active on artificial turf.  Wear high heels. What are the signs or symptoms? Symptoms of this condition include:  Pain in the affected hand, foot, or knee.  Pain when you try to straighten the affected toe or finger.  A popping sound that happens at the time of injury.  Swelling.  Bruising. How is this diagnosed? This condition is diagnosed with:  A physical exam.  Observation of your movement while you walk.  An X-ray.  Bone scans. How is this treated? Treatment for this condition depends on the location, type, and severity of the injury. Treatment may involve:  Resting the affected area and avoiding activities that are causing injury.  Applying ice to the affected area.  Taking over-the-counter pain medicine.  Placing a cushioned pad in the shoe of the affected foot.  Taping the affected finger or toe to prevent movement.  Getting steroid injections.  Wearing a cast, brace, or orthotic  shoe.  Doing physical therapy. Surgery may be needed if other treatments do not work. Follow these instructions at home: If you have a cast:  Do not put pressure on any part of the cast until it is fully hardened. This may take several hours.  Do not stick anything inside the cast to scratch your skin. Doing that increases your risk of infection.  Check the skin around it every day. Tell your health care provider about any concerns.  You may put lotion on dry skin around the edges of the cast. Do not put lotion on the skin underneath it.  Keep it clean and dry. If you have a brace or orthotic shoe:  Wear it as told by your health care provider. Remove it only as told by your health care provider.  Loosen it if your fingers or toes tingle, become numb, or turn cold and blue.  Keep it clean and dry.   Managing pain, stiffness, and swelling   If directed, put ice on the injured area. To do this: ? If you have a removable brace or orthotic shoe, remove it as told by your health care provider. ? Put ice in a plastic bag. ? Place a towel between your skin and the bag or between your cast and the bag. ? Leave the ice on for 20 minutes, 2-3 times a day.  Move your fingers or toes often to reduce stiffness and swelling.  Raise (elevate) the injured area above the level of your heart while you  are sitting or lying down. Driving  Ask your health care provider if the medicine prescribed to you requires you to avoid driving or using heavy machinery.  Ask your health care provider when it is safe to drive if you have a cast, brace, or orthotic shoe on a foot that you use for driving. Activity  Return to your normal activities as told by your health care provider. Ask your health care provider what activities are safe for you.  Do not use the injured limb to support your body weight until your health care provider says that you can. Use crutches as told by your health care  provider.  Do exercises as told by your health care provider or physical therapist. General instructions  Take over-the-counter and prescription medicines only as told by your health care provider.  Do not use any products that contain nicotine or tobacco, such as cigarettes, e-cigarettes, and chewing tobacco. If you need help quitting, ask your health care provider.  Keep all follow-up visits as told by your health care provider. This is important. Contact a health care provider if:  You have pain and swelling that continues, even with treatment.  Your pain and swelling returns after you get back to your normal activities.  You cannot put pressure on your foot. Get help right away if:  You lose sensation in the affected area.  Your fingers or toes turn cold and blue. Summary  A sesamoid injury happens when a sesamoid bone or a surrounding tendon gets damaged during activity.  Symptoms of this condition include pain in the affected area, a popping sound at the time of injury, swelling, and bruising.  Treatment for this condition depends on the location, type, and severity of the injury. This information is not intended to replace advice given to you by your health care provider. Make sure you discuss any questions you have with your health care provider. Document Revised: 01/28/2019 Document Reviewed: 12/18/2018 Elsevier Patient Education  [place].

## 2020-08-02 NOTE — Progress Notes (Signed)
  Subjective:  Patient ID: Marcia Livingston, female    DOB: [birth year],  MRN: [id number]  Chief Complaint  Patient presents with  . Toe Injury    Patient presents today for a fall this morning, She thinks her right great toe bent backwards, now its swelling, starting to bruise and painful to lift her toe or walk    74 y.o. female presents with the above complaint. History confirmed with patient.  She is here for an urgent visit.  She is here with her daughter who is a Marine scientist at [employer].  She tripped over the threshold of a dog gate at home and feels that her toes were forcibly dorsiflexed.  She had immediate pain and presented for evaluation.  Objective:  Physical Exam: warm, good capillary refill, no trophic changes or ulcerative lesions, normal DP and PT pulses and normal sensory exam.  Right Foot: Edema about the first metatarsophalangeal joint with ecchymosis plantar medial, sharp pain on palpation of the tibial sesamoids and with dorsiflexion of the hallux   Radiographs: X-ray of the right foot: There is an oblique fracture of the tibial sesamoid which is not present on prior films dated 06/23/2020 Assessment:   1. Foot injury, left, initial encounter   2. Closed fracture of sesamoid bone of right foot, initial encounter      Plan:  Patient was evaluated and treated and all questions answered.  Discussed with patient that she has a fracture of the tibial sesamoid due to forced dorsiflexion injury of the hallux.  I discussed with her treatment options including surgical and nonsurgical intervention.  I recommend we treat this nonsurgically with immobilization in a CAM boot with an offloading dancers pad.  I also recommend that she obtain an even up shoe to even out the hips and back while she is wearing the boot.  I will see her in 1 month for follow-up radiographs.  Return in about 1 month (around 09/02/2020).

## 2020-08-04 ENCOUNTER — Other Ambulatory Visit: Payer: Self-pay

## 2020-08-04 ENCOUNTER — Ambulatory Visit
Admission: RE | Admit: 2020-08-04 | Discharge: 2020-08-04 | Disposition: A | Discharge status: Home / Self Care | Payer: Medicare Other | Source: Ambulatory Visit | Attending: Internal Medicine | Admitting: Internal Medicine

## 2020-08-04 DIAGNOSIS — Z1231 Encounter for screening mammogram for malignant neoplasm of breast: Secondary | ICD-10-CM | POA: Diagnosis not present

## 2020-08-05 ENCOUNTER — Other Ambulatory Visit: Payer: Self-pay | Admitting: Podiatry

## 2020-08-05 DIAGNOSIS — S92811A Other fracture of right foot, initial encounter for closed fracture: Secondary | ICD-10-CM

## 2020-08-10 DIAGNOSIS — Z23 Encounter for immunization: Secondary | ICD-10-CM | POA: Diagnosis not present

## 2020-08-18 ENCOUNTER — Encounter: Payer: Medicare Other | Admitting: Family Medicine

## 2020-08-23 ENCOUNTER — Ambulatory Visit: Payer: Medicare Other | Admitting: Podiatry

## 2020-08-25 DIAGNOSIS — F419 Anxiety disorder, unspecified: Secondary | ICD-10-CM | POA: Diagnosis not present

## 2020-08-25 DIAGNOSIS — F32A Depression, unspecified: Secondary | ICD-10-CM | POA: Diagnosis not present

## 2020-08-26 ENCOUNTER — Other Ambulatory Visit: Payer: Medicare Other

## 2020-09-02 DIAGNOSIS — Z20822 Contact with and (suspected) exposure to covid-19: Secondary | ICD-10-CM | POA: Diagnosis not present

## 2020-09-07 DIAGNOSIS — R252 Cramp and spasm: Secondary | ICD-10-CM | POA: Diagnosis not present

## 2020-09-07 DIAGNOSIS — M79641 Pain in right hand: Secondary | ICD-10-CM | POA: Diagnosis not present

## 2020-09-07 DIAGNOSIS — M79642 Pain in left hand: Secondary | ICD-10-CM | POA: Diagnosis not present

## 2020-09-07 DIAGNOSIS — M545 Low back pain, unspecified: Secondary | ICD-10-CM | POA: Diagnosis not present

## 2020-09-07 DIAGNOSIS — G629 Polyneuropathy, unspecified: Secondary | ICD-10-CM | POA: Diagnosis not present

## 2020-09-07 DIAGNOSIS — M25512 Pain in left shoulder: Secondary | ICD-10-CM | POA: Diagnosis not present

## 2020-09-07 DIAGNOSIS — M25511 Pain in right shoulder: Secondary | ICD-10-CM | POA: Diagnosis not present

## 2020-09-07 DIAGNOSIS — M1990 Unspecified osteoarthritis, unspecified site: Secondary | ICD-10-CM | POA: Diagnosis not present

## 2020-09-07 DIAGNOSIS — G8929 Other chronic pain: Secondary | ICD-10-CM | POA: Diagnosis not present

## 2020-09-08 ENCOUNTER — Ambulatory Visit: Payer: Medicare Other | Admitting: Podiatry

## 2020-09-13 ENCOUNTER — Ambulatory Visit (INDEPENDENT_AMBULATORY_CARE_PROVIDER_SITE_OTHER): Payer: Medicare Other

## 2020-09-13 ENCOUNTER — Ambulatory Visit (INDEPENDENT_AMBULATORY_CARE_PROVIDER_SITE_OTHER): Payer: Medicare Other | Admitting: Podiatry

## 2020-09-13 ENCOUNTER — Other Ambulatory Visit: Payer: Self-pay

## 2020-09-13 DIAGNOSIS — S92811A Other fracture of right foot, initial encounter for closed fracture: Secondary | ICD-10-CM | POA: Diagnosis not present

## 2020-09-14 ENCOUNTER — Encounter: Payer: Self-pay | Admitting: Podiatry

## 2020-09-14 NOTE — Progress Notes (Signed)
°  Subjective:  Patient ID: Marcia Livingston, female    DOB: Nov 27, 1945,  MRN: [id number]  Foot feeling much better in the boot  74 y.o. female returns with the above complaint. History confirmed with patient.  She is feeling much better.  The CAM boot has been helpful.  She still has tenderness with dorsiflexion of the hallux  Objective:  Physical Exam: warm, good capillary refill, no trophic changes or ulcerative lesions, normal DP and PT pulses and normal sensory exam.  Right Foot: Mild pain on tibial sesamoid with hallux dorsiflexion   Radiographs: X-ray of the right foot: Fracture alignment unchanged, unclear if bony bridging present or not Assessment:   1. Closed fracture of sesamoid bone of right foot, initial encounter      Plan:  Patient was evaluated and treated and all questions answered.  She is beginning to feel better.  I recommend over the next couple weeks she can begin to discontinue the CAM boot if she feels okay.  Avoid dorsiflexion of the hallux.  Discussed with her that she should avoid standing on her tiptoes.  We will recheck a new x-ray in 6 weeks  Return in about 6 weeks (around 10/25/2020) for re-check sesamoid fracture right foot, new x-ray.

## 2020-09-21 DIAGNOSIS — M545 Low back pain, unspecified: Secondary | ICD-10-CM | POA: Diagnosis not present

## 2020-09-21 DIAGNOSIS — M25511 Pain in right shoulder: Secondary | ICD-10-CM | POA: Diagnosis not present

## 2020-09-21 DIAGNOSIS — M25512 Pain in left shoulder: Secondary | ICD-10-CM | POA: Diagnosis not present

## 2020-09-21 DIAGNOSIS — G8929 Other chronic pain: Secondary | ICD-10-CM | POA: Diagnosis not present

## 2020-10-10 ENCOUNTER — Telehealth: Payer: Medicare Other | Admitting: Family

## 2020-10-10 DIAGNOSIS — J019 Acute sinusitis, unspecified: Secondary | ICD-10-CM

## 2020-10-10 DIAGNOSIS — B9689 Other specified bacterial agents as the cause of diseases classified elsewhere: Secondary | ICD-10-CM

## 2020-10-10 MED ORDER — DOXYCYCLINE HYCLATE 100 MG PO TABS: 100.0000 mg | ORAL_TABLET | Freq: Two times a day (BID) | ORAL | 0 refills | Status: DC

## 2020-10-10 NOTE — Progress Notes (Signed)
We are sorry that you are not feeling well.  Here is how we plan to help!  Based on what you have shared with me it looks like you have sinusitis.  Sinusitis is inflammation and infection in the sinus cavities of the head.  Based on your presentation I believe you most likely have Acute Bacterial Sinusitis.  This is an infection caused by bacteria and is treated with antibiotics. I have prescribed Doxycycline 100mg by mouth twice a day for 10 days. You may use an oral decongestant such as Mucinex D or if you have glaucoma or high blood pressure use plain Mucinex. Saline nasal spray help and can safely be used as often as needed for congestion.  If you develop worsening sinus pain, fever or notice severe headache and vision changes, or if symptoms are not better after completion of antibiotic, please schedule an appointment with a health care provider.    Sinus infections are not as easily transmitted as other respiratory infection, however we still recommend that you avoid close contact with loved ones, especially the very young and elderly.  Remember to wash your hands thoroughly throughout the day as this is the number one way to prevent the spread of infection!  Home Care:  Only take medications as instructed by your medical team.  Complete the entire course of an antibiotic.  Do not take these medications with alcohol.  A steam or ultrasonic humidifier can help congestion.  You can place a towel over your head and breathe in the steam from hot water coming from a faucet.  Avoid close contacts especially the very young and the elderly.  Cover your mouth when you cough or sneeze.  Always remember to wash your hands.  Get Help Right Away If:  You develop worsening fever or sinus pain.  You develop a severe head ache or visual changes.  Your symptoms persist after you have completed your treatment plan.  Make sure you  Understand these instructions.  Will watch your  condition.  Will get help right away if you are not doing well or get worse.  Your e-visit answers were reviewed by a board certified advanced clinical practitioner to complete your personal care plan.  Depending on the condition, your plan could have included both over the counter or prescription medications.  If there is a problem please reply  once you have received a response from your provider.  Your safety is important to us.  If you have drug allergies check your prescription carefully.    You can use MyChart to ask questions about today's visit, request a non-urgent call back, or ask for a work or school excuse for 24 hours related to this e-Visit. If it has been greater than 24 hours you will need to follow up with your provider, or enter a new e-Visit to address those concerns.  You will get an e-mail in the next two days asking about your experience.  I hope that your e-visit has been valuable and will speed your recovery. Thank you for using e-visits.  Greater than 5 minutes, yet less than 10 minutes of time have been spent researching, coordinating, and implementing care for this patient today.  Thank you for the details you included in the comment boxes. Those details are very helpful in determining the best course of treatment for you and help us to provide the best care.  

## 2020-10-22 ENCOUNTER — Other Ambulatory Visit: Payer: Self-pay | Admitting: Family Medicine

## 2020-10-25 ENCOUNTER — Ambulatory Visit: Payer: Medicare Other | Admitting: Podiatry

## 2020-11-03 DIAGNOSIS — Z20822 Contact with and (suspected) exposure to covid-19: Secondary | ICD-10-CM | POA: Diagnosis not present

## 2020-11-03 DIAGNOSIS — R058 Other specified cough: Secondary | ICD-10-CM | POA: Diagnosis not present

## 2020-11-07 ENCOUNTER — Other Ambulatory Visit: Payer: Self-pay | Admitting: Family Medicine

## 2020-11-17 DIAGNOSIS — R0789 Other chest pain: Secondary | ICD-10-CM | POA: Diagnosis not present

## 2020-11-17 DIAGNOSIS — Z20822 Contact with and (suspected) exposure to covid-19: Secondary | ICD-10-CM | POA: Diagnosis not present

## 2020-11-17 DIAGNOSIS — Z03818 Encounter for observation for suspected exposure to other biological agents ruled out: Secondary | ICD-10-CM | POA: Diagnosis not present

## 2020-11-17 DIAGNOSIS — J9811 Atelectasis: Secondary | ICD-10-CM | POA: Diagnosis not present

## 2020-11-29 DIAGNOSIS — U071 COVID-19: Secondary | ICD-10-CM | POA: Diagnosis not present

## 2020-11-29 DIAGNOSIS — Z20822 Contact with and (suspected) exposure to covid-19: Secondary | ICD-10-CM | POA: Diagnosis not present

## 2020-12-02 DIAGNOSIS — U071 COVID-19: Secondary | ICD-10-CM | POA: Diagnosis not present

## 2020-12-02 DIAGNOSIS — E860 Dehydration: Secondary | ICD-10-CM | POA: Diagnosis not present

## 2020-12-03 ENCOUNTER — Ambulatory Visit: Payer: Medicare Other | Admitting: Urology

## 2020-12-06 DIAGNOSIS — Z6841 Body Mass Index (BMI) 40.0 and over, adult: Secondary | ICD-10-CM | POA: Diagnosis not present

## 2020-12-06 DIAGNOSIS — U071 COVID-19: Secondary | ICD-10-CM | POA: Diagnosis not present

## 2020-12-06 DIAGNOSIS — J449 Chronic obstructive pulmonary disease, unspecified: Secondary | ICD-10-CM | POA: Diagnosis not present

## 2020-12-08 ENCOUNTER — Encounter: Payer: Self-pay | Admitting: Urology

## 2020-12-08 ENCOUNTER — Other Ambulatory Visit: Payer: Self-pay

## 2020-12-08 ENCOUNTER — Ambulatory Visit (INDEPENDENT_AMBULATORY_CARE_PROVIDER_SITE_OTHER): Payer: Medicare Other | Admitting: Urology

## 2020-12-08 VITALS — BP 169/77 | HR 75 | Ht 61.0 in | Wt 211.0 lb

## 2020-12-08 DIAGNOSIS — R32 Unspecified urinary incontinence: Secondary | ICD-10-CM | POA: Diagnosis not present

## 2020-12-08 DIAGNOSIS — R8281 Pyuria: Secondary | ICD-10-CM | POA: Diagnosis not present

## 2020-12-08 DIAGNOSIS — N393 Stress incontinence (female) (male): Secondary | ICD-10-CM

## 2020-12-08 DIAGNOSIS — N3941 Urge incontinence: Secondary | ICD-10-CM

## 2020-12-08 LAB — URINALYSIS, COMPLETE
Bilirubin, UA: NEGATIVE
Glucose, UA: NEGATIVE
Ketones, UA: NEGATIVE
Leukocytes,UA: NEGATIVE
Nitrite, UA: NEGATIVE
Protein,UA: NEGATIVE
Specific Gravity, UA: 1.02 (ref 1.005–1.030)
Urobilinogen, Ur: 0.2 mg/dL (ref 0.2–1.0)
pH, UA: 6.5 (ref 5.0–7.5)

## 2020-12-08 LAB — MICROSCOPIC EXAMINATION

## 2020-12-08 NOTE — Progress Notes (Unsigned)
12/08/2020 10:49 AM   Marcia Livingston [birth year] [id number]  Referring provider: Baxter Hire, MD Marcia Livingston,  [name] [address]  Chief Complaint  Patient presents with  . Urinary Incontinence    HPI: Marcia Livingston is a 75 y.o. female referred for evaluation of urinary incontinence.   States hysterectomy at age 70 and 1 year after her surgery began to have mild voiding symptoms and urinary incontinence  Her symptoms have worsened over the past 10 years and she complains of urinary frequency, urgency with urge incontinence which are her most bothersome symptoms.  These symptoms have significantly worsened over the last 12 months  She typically will have onset of intense urge when standing  No prior treatment.  + Mild stress incontinence  Denies flank, abdominal or pelvic pain  Denies dysuria, recurrent UTI or gross hematuria  No bowel symptoms  2 previous spontaneous vaginal deliveries  Denies previous GU evaluation  No history urologic problems including CVA, MS or Parkinson's  PMH: Past Medical History:  Diagnosis Date  . Anxiety   . Arthritis   . CAD (coronary artery disease)   . Cancer ([place])    SKIN  . Colon polyps   . COPD (chronic obstructive pulmonary disease) ([place])   . Depression   . Depression   . Fibrocystic breast disease   . GERD (gastroesophageal reflux disease)   . Heart murmur   . High cholesterol   . Hyperlipidemia   . Hypertension   . IBS (irritable bowel syndrome)   . IBS (irritable bowel syndrome)   . Obesity   . Osteoporosis   . Overactive bladder   . Sleep apnea   . Sleep apnea     Surgical History: Past Surgical History:  Procedure Laterality Date  . ABDOMINAL HYSTERECTOMY     total  . APPENDECTOMY    . BREAST EXCISIONAL BIOPSY Right YRS AGO   NEG  . CATARACT EXTRACTION W/PHACO Right 02/01/2016   Procedure: CATARACT EXTRACTION PHACO AND INTRAOCULAR LENS PLACEMENT (IOC);  Surgeon: Birder Robson, MD;   Location: [facility] ORS;  Service: Ophthalmology;  Laterality: Right;  [place]    00:37.2 AP%  20.7% CDE  7.69 fluid pack lot # [id number]  . CATARACT EXTRACTION W/PHACO Left 02/22/2016   Procedure: CATARACT EXTRACTION PHACO AND INTRAOCULAR LENS PLACEMENT (IOC);  Surgeon: Birder Robson, MD;  Location: [facility] ORS;  Service: Ophthalmology;  Laterality: Left;  [place] 00:40 AP% 19.9 CDE 8.06 fluid pack lot # [id number]  . CHOLECYSTECTOMY    . COLONOSCOPY WITH PROPOFOL N/A 01/13/2016   Procedure: COLONOSCOPY WITH PROPOFOL;  Surgeon: Manya Silvas, MD;  Location: [facility] ENDOSCOPY;  Service: Endoscopy;  Laterality: N/A;  . FOOT SURGERY Left    for breakdown of arch  . OVARIAN CYST SURGERY    . SKIN CANCER EXCISION Right 05/16/2017   Right Shin    Home Medications:  Allergies as of 12/08/2020      Reactions   Penicillins Rash   Has patient had a PCN reaction causing immediate rash, facial/tongue/throat swelling, SOB or lightheadedness with hypotension: [id number]} Has patient had a PCN reaction causing severe rash involving mucus membranes or skin necrosis: no:[id number]} Has patient had a PCN reaction that required hospitalization no:[id number]} Has patient had a PCN reaction occurring within the last 10 years: no:[id number]} If all of the above answers are "NO", then may proceed with Cephalosporin use.   Codeine Hives   Sulfa Antibiotics Hives      Medication List  Accurate as of December 08, 2020 10:49 AM. If you have any questions, ask your nurse or doctor.        albuterol 108 (90 Base) MCG/ACT inhaler Commonly known as: VENTOLIN HFA Inhale into the lungs.   aspirin EC 81 MG tablet Take 81 mg by mouth daily.   buPROPion 150 MG 24 hr tablet Commonly known as: WELLBUTRIN XL TAKE 1 TABLET BY MOUTH  DAILY   busPIRone 15 MG tablet Commonly known as: BUSPAR TAKE 1/2 TABLET (7.5 MG TOTAL) BY MOUTH TWICE DAILY   calcium carbonate 500 MG chewable tablet Commonly known as: TUMS - dosed in mg  elemental calcium Chew 1 tablet by mouth 3 (three) times daily with meals.   cholecalciferol 1000 units tablet Commonly known as: VITAMIN D Take 1,000 Units by mouth daily.   colestipol 1 g tablet Commonly known as: COLESTID TAKE 1 TABLET BY MOUTH  DAILY   doxycycline 100 MG tablet Commonly known as: VIBRA-TABS Take 1 tablet (100 mg total) by mouth 2 (two) times daily.   DULoxetine 60 MG capsule Commonly known as: CYMBALTA TAKE 1 CAPSULE BY MOUTH  DAILY   gabapentin 300 MG capsule Commonly known as: NEURONTIN Take 1 capsule (300 mg total) by mouth at bedtime.   hydrochlorothiazide 25 MG tablet Commonly known as: HYDRODIURIL TAKE 1 TABLET BY MOUTH  DAILY   losartan 50 MG tablet Commonly known as: COZAAR TAKE 1 TABLET BY MOUTH  DAILY   meclizine 25 MG tablet Commonly known as: ANTIVERT Take 25 mg by mouth 3 (three) times daily as needed for dizziness.   rosuvastatin 5 MG tablet Commonly known as: CRESTOR Take 5 mg by mouth 3 (three) times a week.   traZODone 50 MG tablet Commonly known as: DESYREL Take 1-2 tablets at bedtime for sleep.   Trelegy Ellipta 100-62.5-25 MCG/INH Aepb Generic drug: Fluticasone-Umeclidin-Vilant Inhale 1 puff into the lungs daily.   triamcinolone ointment 0.1 % Commonly known as: KENALOG APPLY TO AFFECTED AREAS ON ARMS TWICE DAILY AS NEEDED       Allergies:  Allergies  Allergen Reactions  . Penicillins Rash    Has patient had a PCN reaction causing immediate rash, facial/tongue/throat swelling, SOB or lightheadedness with hypotension: [id number]} Has patient had a PCN reaction causing severe rash involving mucus membranes or skin necrosis: no:[id number]} Has patient had a PCN reaction that required hospitalization no:[id number]} Has patient had a PCN reaction occurring within the last 10 years: no:[id number]} If all of the above answers are "NO", then may proceed with Cephalosporin use.   . Codeine Hives  . Sulfa Antibiotics Hives     Family History: Family History  Problem Relation Age of Onset  . Colon cancer Mother        thought to be mets from breast cancer  . Stroke Mother   . Hypertension Mother   . Heart attack Mother   . Breast cancer Mother 12  . Emphysema Father   . Prostate cancer Father   . Throat cancer Daughter   . Breast cancer Maternal Aunt        70's  . Rheum arthritis Daughter   . Hypertension Daughter   . Reye's syndrome Daughter        history of  . Hepatitis C Daughter   . Ovarian cancer Neg Hx     Social History:  reports that she quit smoking about 24 years ago. Her smoking use included cigarettes. She has a 30.00 pack-year smoking history. She has never  used smokeless tobacco. She reports current alcohol use. She reports that she does not use drugs.   Physical Exam: BP (!) 169/77   Pulse 75   Ht 5\' 1"  (1.549 m)   Wt 211 lb (95.7 kg)   BMI 39.87 kg/m   Constitutional:  Alert and oriented, No acute distress. HEENT: [place] AT, moist mucus membranes.  Trachea midline, no masses. Cardiovascular: No clubbing, cyanosis, or edema. Respiratory: Normal respiratory effort, no increased work of breathing. Skin: No rashes, bruises or suspicious lesions. Neurologic: Grossly intact, no focal deficits, moving all 4 extremities. Psychiatric: Normal mood and affect.  Laboratory Data:  Urinalysis Dipstick trace intact blood Microscopy 6-10 WBC/3-10 RBC/0 epis   Assessment & Plan:    1.  Overactive bladder with urge incontinence  Bothersome symptoms  Options were discussed including pelvic floor physical therapy and medical management  She was interested in medical management and initially given samples of Myrbetriq  Follow-up 1 month for symptom recheck with bladder scan  2.  Stress urinary incontinence  Mild  3.  Pyuria  Asymptomatic  Urine culture ordered   Abbie Sons, [place] [address], [place], [place]  [address] [phone]

## 2020-12-09 ENCOUNTER — Encounter: Payer: Self-pay | Admitting: Urology

## 2020-12-11 LAB — CULTURE, URINE COMPREHENSIVE

## 2020-12-14 ENCOUNTER — Encounter: Payer: Self-pay | Admitting: Urology

## 2020-12-15 DIAGNOSIS — R0789 Other chest pain: Secondary | ICD-10-CM | POA: Diagnosis not present

## 2020-12-15 DIAGNOSIS — G4733 Obstructive sleep apnea (adult) (pediatric): Secondary | ICD-10-CM | POA: Diagnosis not present

## 2020-12-15 DIAGNOSIS — Z9889 Other specified postprocedural states: Secondary | ICD-10-CM | POA: Diagnosis not present

## 2020-12-15 DIAGNOSIS — Z9989 Dependence on other enabling machines and devices: Secondary | ICD-10-CM | POA: Diagnosis not present

## 2020-12-15 DIAGNOSIS — I10 Essential (primary) hypertension: Secondary | ICD-10-CM | POA: Diagnosis not present

## 2020-12-15 DIAGNOSIS — J449 Chronic obstructive pulmonary disease, unspecified: Secondary | ICD-10-CM | POA: Diagnosis not present

## 2020-12-15 DIAGNOSIS — I350 Nonrheumatic aortic (valve) stenosis: Secondary | ICD-10-CM | POA: Diagnosis not present

## 2020-12-15 DIAGNOSIS — R079 Chest pain, unspecified: Secondary | ICD-10-CM | POA: Diagnosis not present

## 2020-12-15 NOTE — Telephone Encounter (Signed)
At her office visit [name] indicated she was asymptomatic. Treating asymptomatic bacteriuria will worsen drug resistance so treatment is not recommended unless she develops symptoms   Patient notified of above message by phone per response to Marcia Livingston

## 2020-12-20 DIAGNOSIS — H26492 Other secondary cataract, left eye: Secondary | ICD-10-CM | POA: Diagnosis not present

## 2020-12-20 DIAGNOSIS — H52223 Regular astigmatism, bilateral: Secondary | ICD-10-CM | POA: Diagnosis not present

## 2020-12-20 DIAGNOSIS — H43813 Vitreous degeneration, bilateral: Secondary | ICD-10-CM | POA: Diagnosis not present

## 2020-12-27 ENCOUNTER — Other Ambulatory Visit: Payer: Self-pay | Admitting: Family Medicine

## 2020-12-28 ENCOUNTER — Other Ambulatory Visit: Payer: Self-pay | Admitting: Family Medicine

## 2020-12-29 ENCOUNTER — Other Ambulatory Visit: Payer: Self-pay | Admitting: Family Medicine

## 2020-12-30 ENCOUNTER — Other Ambulatory Visit: Payer: Self-pay | Admitting: Family Medicine

## 2020-12-30 DIAGNOSIS — R079 Chest pain, unspecified: Secondary | ICD-10-CM | POA: Diagnosis not present

## 2021-01-03 DIAGNOSIS — Z85828 Personal history of other malignant neoplasm of skin: Secondary | ICD-10-CM | POA: Diagnosis not present

## 2021-01-03 DIAGNOSIS — D2261 Melanocytic nevi of right upper limb, including shoulder: Secondary | ICD-10-CM | POA: Diagnosis not present

## 2021-01-03 DIAGNOSIS — D2262 Melanocytic nevi of left upper limb, including shoulder: Secondary | ICD-10-CM | POA: Diagnosis not present

## 2021-01-03 DIAGNOSIS — X32XXXA Exposure to sunlight, initial encounter: Secondary | ICD-10-CM | POA: Diagnosis not present

## 2021-01-03 DIAGNOSIS — E782 Mixed hyperlipidemia: Secondary | ICD-10-CM | POA: Diagnosis not present

## 2021-01-03 DIAGNOSIS — R739 Hyperglycemia, unspecified: Secondary | ICD-10-CM | POA: Diagnosis not present

## 2021-01-03 DIAGNOSIS — D2271 Melanocytic nevi of right lower limb, including hip: Secondary | ICD-10-CM | POA: Diagnosis not present

## 2021-01-03 DIAGNOSIS — D225 Melanocytic nevi of trunk: Secondary | ICD-10-CM | POA: Diagnosis not present

## 2021-01-03 DIAGNOSIS — L570 Actinic keratosis: Secondary | ICD-10-CM | POA: Diagnosis not present

## 2021-01-06 ENCOUNTER — Ambulatory Visit (INDEPENDENT_AMBULATORY_CARE_PROVIDER_SITE_OTHER): Payer: Medicare Other | Admitting: Urology

## 2021-01-06 ENCOUNTER — Other Ambulatory Visit: Payer: Self-pay

## 2021-01-06 ENCOUNTER — Encounter: Payer: Self-pay | Admitting: Urology

## 2021-01-06 VITALS — BP 175/70 | HR 73 | Ht 61.0 in | Wt 208.0 lb

## 2021-01-06 DIAGNOSIS — N3281 Overactive bladder: Secondary | ICD-10-CM | POA: Diagnosis not present

## 2021-01-06 DIAGNOSIS — N3941 Urge incontinence: Secondary | ICD-10-CM

## 2021-01-06 DIAGNOSIS — R32 Unspecified urinary incontinence: Secondary | ICD-10-CM

## 2021-01-06 LAB — BLADDER SCAN AMB NON-IMAGING: Scan Result: 0

## 2021-01-06 MED ORDER — MIRABEGRON ER 50 MG PO TB24: 50.0000 mg | ORAL_TABLET | Freq: Every day | ORAL | 1 refills | Status: DC

## 2021-01-06 NOTE — Progress Notes (Signed)
01/06/2021 11:20 AM   Marcia Livingston Feb 18, 1946 [id number]  Referring provider: Baxter Hire, MD [place],  [place] [address]  Chief Complaint  Patient presents with  . Over Active Bladder    HPI: 75 y.o. female presents for follow-up of overactive bladder.   Initial visit 12/08/2020 for bothersome frequency, urgency with urge incontinence  Mild stress incontinence  Given a trial of Myrbetriq and she noted marked improvement in her symptoms  Significant improvement in her frequency, urgency and urge incontinence  Presently satisfied with her voiding pattern and desires to continue this medication  PMH: Past Medical History:  Diagnosis Date  . Anxiety   . Arthritis   . CAD (coronary artery disease)   . Cancer ([place])    SKIN  . Colon polyps   . COPD (chronic obstructive pulmonary disease) ([place])   . Depression   . Depression   . Fibrocystic breast disease   . GERD (gastroesophageal reflux disease)   . Heart murmur   . High cholesterol   . Hyperlipidemia   . Hypertension   . IBS (irritable bowel syndrome)   . IBS (irritable bowel syndrome)   . Obesity   . Osteoporosis   . Overactive bladder   . Sleep apnea   . Sleep apnea     Surgical History: Past Surgical History:  Procedure Laterality Date  . ABDOMINAL HYSTERECTOMY     total  . APPENDECTOMY    . BREAST EXCISIONAL BIOPSY Right YRS AGO   NEG  . CATARACT EXTRACTION W/PHACO Right 02/01/2016   Procedure: CATARACT EXTRACTION PHACO AND INTRAOCULAR LENS PLACEMENT (IOC);  Surgeon: Birder Robson, MD;  Location: [facility] ORS;  Service: Ophthalmology;  Laterality: Right;  [place]    00:37.2 AP%  20.7% CDE  7.69 fluid pack lot # [id number]  . CATARACT EXTRACTION W/PHACO Left 02/22/2016   Procedure: CATARACT EXTRACTION PHACO AND INTRAOCULAR LENS PLACEMENT (IOC);  Surgeon: Birder Robson, MD;  Location: [facility] ORS;  Service: Ophthalmology;  Laterality: Left;  [place] 00:40 AP% 19.9 CDE 8.06 fluid pack lot #  [id number]  . CHOLECYSTECTOMY    . COLONOSCOPY WITH PROPOFOL N/A 01/13/2016   Procedure: COLONOSCOPY WITH PROPOFOL;  Surgeon: Manya Silvas, MD;  [facility] END[facility]COPY;  Service: Endoscopy;  Laterality: N/A;  . FOOT SURGERY Left    for breakdown of arch  . OVARIAN CYST SURGERY    . SKIN CANCER EXCISION Right 05/16/2017   Right Shin    Home Medications:  Allergies as of 01/06/2021      Reactions   Penicillins Rash   Has patient had a PCN reaction causing immediate rash, facial/tongue/throat swelling, SOB or lightheadedness with hypotension: EXB:[id number]} Has patient had a PCN reaction causing severe rash involving mucus membranes or skin necrosis: no:[id number]} Has patient had a PCN reaction that required hospitalization no:30480221} Has patient had a PCN reaction occurring within the last 10 years: no:[id number]} If all of the above answers are "NO", then may proceed with Cephalosporin use.   Codeine Hives   Sulfa Antibiotics Hives      Medication List       Accurate as of January 06, 2021 11:20 AM. If you have any questions, ask your nurse or doctor.        albuterol 108 (90 Base) MCG/ACT inhaler Commonly known as: VENTOLIN HFA Inhale into the lungs.   aspirin EC 81 MG tablet Take 81 mg by mouth daily.   buPROPion 150 MG 24 hr tablet Commonly known as:  WELLBUTRIN XL TAKE 1 TABLET BY MOUTH  DAILY   busPIRone 15 MG tablet Commonly known as: BUSPAR TAKE 1/2 TABLET (7.5 MG TOTAL) BY MOUTH TWICE DAILY   calcium carbonate 500 MG chewable tablet Commonly known as: TUMS - dosed in mg elemental calcium Chew 1 tablet by mouth 3 (three) times daily with meals.   cholecalciferol 1000 units tablet Commonly known as: VITAMIN D Take 1,000 Units by mouth daily.   colestipol 1 g tablet Commonly known as: COLESTID TAKE 1 TABLET BY MOUTH  DAILY   doxycycline 100 MG tablet Commonly known as: VIBRA-TABS Take 1 tablet (100 mg total) by mouth 2 (two) times daily.   DULoxetine 60  MG capsule Commonly known as: CYMBALTA TAKE 1 CAPSULE BY MOUTH  DAILY   gabapentin 300 MG capsule Commonly known as: NEURONTIN Take 1 capsule (300 mg total) by mouth at bedtime.   hydrochlorothiazide 25 MG tablet Commonly known as: HYDRODIURIL TAKE 1 TABLET BY MOUTH  DAILY   losartan 50 MG tablet Commonly known as: COZAAR TAKE 1 TABLET BY MOUTH  DAILY   meclizine 25 MG tablet Commonly known as: ANTIVERT Take 25 mg by mouth 3 (three) times daily as needed for dizziness.   mirabegron ER 50 MG Tb24 tablet Commonly known as: MYRBETRIQ Take by mouth.   rosuvastatin 5 MG tablet Commonly known as: CRESTOR Take 5 mg by mouth 3 (three) times a week.   traZODone 50 MG tablet Commonly known as: DESYREL Take 1-2 tablets at bedtime for sleep.   Trelegy Ellipta 100-62.5-25 MCG/INH Aepb Generic drug: Fluticasone-Umeclidin-Vilant Inhale 1 puff into the lungs daily.   triamcinolone ointment 0.1 % Commonly known as: KENALOG APPLY TO AFFECTED AREAS ON ARMS TWICE DAILY AS NEEDED       Allergies:  Allergies  Allergen Reactions  . Penicillins Rash    Has patient had a PCN reaction causing immediate rash, facial/tongue/throat swelling, SOB or lightheadedness with hypotension: ZOX:[id number]} Has patient had a PCN reaction causing severe rash involving mucus membranes or skin necrosis: no:[id number]} Has patient had a PCN reaction that required hospitalization no:30480221} Has patient had a PCN reaction occurring within the last 10 years: no:[id number]} If all of the above answers are "NO", then may proceed with Cephalosporin use.   . Codeine Hives  . Sulfa Antibiotics Hives    Family History: Family History  Problem Relation Age of Onset  . Colon cancer Mother        thought to be mets from breast cancer  . Stroke Mother   . Hypertension Mother   . Heart attack Mother   . Breast cancer Mother 57  . Emphysema Father   . Prostate cancer Father   . Throat cancer Daughter   .  Breast cancer Maternal Aunt        70's  . Rheum arthritis Daughter   . Hypertension Daughter   . Reye's syndrome Daughter        history of  . Hepatitis C Daughter   . Ovarian cancer Neg Hx     Social History:  reports that she quit smoking about 24 years ago. Her smoking use included cigarettes. She has a 30.00 pack-year smoking history. She has never used smokeless tobacco. She reports current alcohol use. She reports that she does not use drugs.   Physical Exam: BP (!) 175/70   Pulse 73   Ht 5\' 1"  (1.549 m)   Wt 208 lb (94.3 kg)   BMI 39.30 kg/m   Constitutional:  Alert and oriented, No acute distress. HEENT: [place] AT, moist mucus membranes.  Trachea midline, no masses. Cardiovascular: No clubbing, cyanosis, or edema. Respiratory: Normal respiratory effort, no increased work of breathing.    Assessment & Plan:    1.  Overactive bladder with urge incontinence  Marked symptom improvement on Myrbetriq  Rx sent to pharmacy  Bladder scan PVR 0 mL  Follow-up 6 months    Abbie Sons, MD  [facility] [address], [place], [place] [address] [phone]

## 2021-01-07 ENCOUNTER — Encounter: Payer: Self-pay | Admitting: Urology

## 2021-01-10 DIAGNOSIS — J449 Chronic obstructive pulmonary disease, unspecified: Secondary | ICD-10-CM | POA: Diagnosis not present

## 2021-01-10 DIAGNOSIS — Z6841 Body Mass Index (BMI) 40.0 and over, adult: Secondary | ICD-10-CM | POA: Diagnosis not present

## 2021-01-10 DIAGNOSIS — E782 Mixed hyperlipidemia: Secondary | ICD-10-CM | POA: Diagnosis not present

## 2021-01-10 DIAGNOSIS — G4733 Obstructive sleep apnea (adult) (pediatric): Secondary | ICD-10-CM | POA: Diagnosis not present

## 2021-01-10 DIAGNOSIS — F32A Depression, unspecified: Secondary | ICD-10-CM | POA: Diagnosis not present

## 2021-01-10 DIAGNOSIS — G4700 Insomnia, unspecified: Secondary | ICD-10-CM | POA: Diagnosis not present

## 2021-01-10 DIAGNOSIS — I10 Essential (primary) hypertension: Secondary | ICD-10-CM | POA: Diagnosis not present

## 2021-01-10 DIAGNOSIS — F419 Anxiety disorder, unspecified: Secondary | ICD-10-CM | POA: Diagnosis not present

## 2021-01-12 DIAGNOSIS — H43813 Vitreous degeneration, bilateral: Secondary | ICD-10-CM | POA: Diagnosis not present

## 2021-01-12 DIAGNOSIS — H26492 Other secondary cataract, left eye: Secondary | ICD-10-CM | POA: Diagnosis not present

## 2021-01-14 ENCOUNTER — Other Ambulatory Visit: Payer: Self-pay | Admitting: Family Medicine

## 2021-02-02 DIAGNOSIS — I350 Nonrheumatic aortic (valve) stenosis: Secondary | ICD-10-CM | POA: Diagnosis not present

## 2021-02-07 DIAGNOSIS — G4733 Obstructive sleep apnea (adult) (pediatric): Secondary | ICD-10-CM | POA: Diagnosis not present

## 2021-02-07 DIAGNOSIS — Z9889 Other specified postprocedural states: Secondary | ICD-10-CM | POA: Diagnosis not present

## 2021-02-07 DIAGNOSIS — E782 Mixed hyperlipidemia: Secondary | ICD-10-CM | POA: Diagnosis not present

## 2021-02-07 DIAGNOSIS — R609 Edema, unspecified: Secondary | ICD-10-CM | POA: Diagnosis not present

## 2021-02-07 DIAGNOSIS — J449 Chronic obstructive pulmonary disease, unspecified: Secondary | ICD-10-CM | POA: Diagnosis not present

## 2021-02-07 DIAGNOSIS — R0600 Dyspnea, unspecified: Secondary | ICD-10-CM | POA: Diagnosis not present

## 2021-02-07 DIAGNOSIS — Z9989 Dependence on other enabling machines and devices: Secondary | ICD-10-CM | POA: Diagnosis not present

## 2021-02-07 DIAGNOSIS — I350 Nonrheumatic aortic (valve) stenosis: Secondary | ICD-10-CM | POA: Diagnosis not present

## 2021-02-07 DIAGNOSIS — I10 Essential (primary) hypertension: Secondary | ICD-10-CM | POA: Diagnosis not present

## 2021-02-21 DIAGNOSIS — J069 Acute upper respiratory infection, unspecified: Secondary | ICD-10-CM | POA: Diagnosis not present

## 2021-02-21 DIAGNOSIS — Z03818 Encounter for observation for suspected exposure to other biological agents ruled out: Secondary | ICD-10-CM | POA: Diagnosis not present

## 2021-02-21 DIAGNOSIS — R0989 Other specified symptoms and signs involving the circulatory and respiratory systems: Secondary | ICD-10-CM | POA: Diagnosis not present

## 2021-02-21 DIAGNOSIS — Z20822 Contact with and (suspected) exposure to covid-19: Secondary | ICD-10-CM | POA: Diagnosis not present

## 2021-02-28 ENCOUNTER — Other Ambulatory Visit: Payer: Self-pay

## 2021-02-28 ENCOUNTER — Encounter: Payer: Self-pay | Admitting: Cardiovascular Disease

## 2021-02-28 ENCOUNTER — Ambulatory Visit (INDEPENDENT_AMBULATORY_CARE_PROVIDER_SITE_OTHER): Payer: Medicare Other | Admitting: Cardiovascular Disease

## 2021-02-28 VITALS — BP 120/64 | HR 72 | Ht 61.0 in | Wt 209.1 lb

## 2021-02-28 DIAGNOSIS — R0602 Shortness of breath: Secondary | ICD-10-CM

## 2021-02-28 DIAGNOSIS — E7800 Pure hypercholesterolemia, unspecified: Secondary | ICD-10-CM | POA: Diagnosis not present

## 2021-02-28 DIAGNOSIS — I350 Nonrheumatic aortic (valve) stenosis: Secondary | ICD-10-CM | POA: Diagnosis not present

## 2021-02-28 DIAGNOSIS — I10 Essential (primary) hypertension: Secondary | ICD-10-CM

## 2021-02-28 DIAGNOSIS — I2510 Atherosclerotic heart disease of native coronary artery without angina pectoris: Secondary | ICD-10-CM | POA: Diagnosis not present

## 2021-02-28 NOTE — Progress Notes (Signed)
Cardiology Office Note  Date:  02/28/2021   ID:  Marcia Livingston, DOB 08-14-1946, MRN [id number]  PCP:  Baxter Hire, MD   Chief Complaint  Patient presents with  . New Patient (Initial Visit)    Self referral for a 2nd opinion; history with Dr. Saralyn Pilar for CAD & AS. Patient c/o shortness of breath, chest indigestion, dizziness, LE edema as well as swelling in hands. Medications reviewed by the patient verbally.     HPI:   [name] is a 75 year old woman with past medical history of COPD, former smoker Moderate aortic valve stenosis Sleep apnea on CPAP Hyperlipidemia Hypertension Hyperlipidemia Carotid artery stenosis insignificant coronary disease by catheterization October 2011 Who presents by self-referral for second opinion concerning her aortic valve stenosis  Reports having shortness of breath, poor energy Has underlying COPD, obesity, deconditioning Active but tires out easily, symptoms worse through COVID, got deconditioned Some poor sleep, uses CPAP, but still wakes frequently  Presents today with her granddaughter  Concerned of the findings on recent echocardiogram  2D echocardiogram was performed 02/02/2021 which revealed LVEF greater than 55%, with moderate aortic stenosis, calculated aortic valve area 1.2 cm, peak velocity 3.52 m/s, mean gradient 30.1 mmHg, and peak gradient 49.6 mmHg  Echo from 2020, mild stenosis  Lexiscan Myoview was performed 12/30/2020. Gated scintigraphy was 71%. SPECT analysis did not reveal evidence for scar or ischemia.  EKG personally reviewed by myself on todays visit Shows normal sinus rhythm rate 72 bpm nonspecific T wave abnormality anterolateral leads  Orthostatics measured today,  blood pressure 130s, no significant change with heart rate or pressure with standing  Other imaging reviewed Prior stress echocardiogram 11/09/2015 revealed normal left ventricular function, without evidence for ischemia, with calculated aortic  valve area 1.5 cm, mean gradient 10 mmHg. 2D echocardiogram 10/15/2017 revealed normal left ventricular function, with LVEF greater than 55%, peak aortic valve velocity 2.34 m/s, mean gradient 10 mmHg, peak gradient 21.9 mmHg. Repeat 2D echocardiogram 05/01/2019 revealed LVEF greater than 55%, aortic valve peak velocity 2.7 m/s, mean gradient 14.8 mmHg, and peak gradient 29.2 mmHg.    PMH:   has a past medical history of Anxiety, Arthritis, CAD (coronary artery disease), Cancer ([place]), Colon polyps, COPD (chronic obstructive pulmonary disease) ([place]), Depression, Depression, Fibrocystic breast disease, GERD (gastroesophageal reflux disease), Heart murmur, High cholesterol, Hyperlipidemia, Hypertension, IBS (irritable bowel syndrome), IBS (irritable bowel syndrome), Obesity, Osteoporosis, Overactive bladder, Sleep apnea, and Sleep apnea.  PSH:    Past Surgical History:  Procedure Laterality Date  . ABDOMINAL HYSTERECTOMY     total  . APPENDECTOMY    . BREAST EXCISIONAL BIOPSY Right YRS AGO   NEG  . CATARACT EXTRACTION W/PHACO Right 02/01/2016   Procedure: CATARACT EXTRACTION PHACO AND INTRAOCULAR LENS PLACEMENT (IOC);  Surgeon: Birder Robson, MD;  Location: [facility] ORS;  Service: Ophthalmology;  Laterality: Right;  [place]    00:37.2 AP%  20.7% CDE  7.69 fluid pack lot # [id number]  . CATARACT EXTRACTION W/PHACO Left 02/22/2016   Procedure: CATARACT EXTRACTION PHACO AND INTRAOCULAR LENS PLACEMENT (IOC);  Surgeon: Birder Robson, MD;  Location: [facility] ORS;  Service: Ophthalmology;  Laterality: Left;  [place] 00:40 AP% 19.9 CDE 8.06 fluid pack lot # [id number]  . CHOLECYSTECTOMY    . COLONOSCOPY WITH PROPOFOL N/A 01/13/2016   Procedure: COLONOSCOPY WITH PROPOFOL;  Surgeon: Manya Silvas, MD;  Location: [facility] ENDOSCOPY;  Service: Endoscopy;  Laterality: N/A;  . FOOT SURGERY Left    for breakdown of arch  .  OVARIAN CYST SURGERY    . SKIN CANCER EXCISION Right 05/16/2017   Right Shin    Current Outpatient  Medications  Medication Sig Dispense Refill  . aspirin EC 81 MG tablet Take 81 mg by mouth daily.    Marland Kitchen buPROPion (WELLBUTRIN XL) 150 MG 24 hr tablet TAKE 1 TABLET BY MOUTH  DAILY 90 tablet 1  . busPIRone (BUSPAR) 15 MG tablet Take 15 mg by mouth 2 (two) times daily.    . calcium carbonate (TUMS - DOSED IN MG ELEMENTAL CALCIUM) 500 MG chewable tablet Chew 1 tablet by mouth 3 (three) times daily with meals.    . cholecalciferol (VITAMIN D) 1000 units tablet Take 1,000 Units by mouth daily.    . colestipol (COLESTID) 1 g tablet TAKE 1 TABLET BY MOUTH  DAILY 90 tablet 0  . doxycycline (VIBRA-TABS) 100 MG tablet Take 1 tablet (100 mg total) by mouth 2 (two) times daily. 20 tablet 0  . DULoxetine (CYMBALTA) 60 MG capsule TAKE 1 CAPSULE BY MOUTH  DAILY 90 capsule 1  . Fluticasone-Umeclidin-Vilant (TRELEGY ELLIPTA) 100-62.5-25 MCG/INH AEPB Inhale 1 puff into the lungs daily.     Marland Kitchen gabapentin (NEURONTIN) 100 MG capsule Take by mouth.    . hydrochlorothiazide (HYDRODIURIL) 25 MG tablet TAKE 1 TABLET BY MOUTH  DAILY 90 tablet 3  . losartan (COZAAR) 50 MG tablet TAKE 1 TABLET BY MOUTH  DAILY 90 tablet 3  . meclizine (ANTIVERT) 25 MG tablet Take 25 mg by mouth 3 (three) times daily as needed for dizziness.    . mirabegron ER (MYRBETRIQ) 50 MG TB24 tablet Take 1 tablet (50 mg total) by mouth daily. 90 tablet 1  . rosuvastatin (CRESTOR) 5 MG tablet Take 5 mg by mouth 3 (three) times a week.    . traZODone (DESYREL) 50 MG tablet Take 1-2 tablets at bedtime for sleep. 180 tablet 1   No current facility-administered medications for this visit.    Allergies:   Penicillins, Codeine, and Sulfa antibiotics   Social History:  The patient  reports that she quit smoking about 24 years ago. Her smoking use included cigarettes. She has a 30.00 pack-year smoking history. She has never used smokeless tobacco. She reports current alcohol use. She reports that she does not use drugs.   Family History:   family history  includes Breast cancer in her maternal aunt; Breast cancer (age of onset: 50) in her mother; Colon cancer in her mother; Emphysema in her father; Heart attack in her mother; Hepatitis C in her daughter; Hypertension in her daughter and mother; Prostate cancer in her father; Reye's syndrome in her daughter; Rheum arthritis in her daughter; Stroke in her mother; Throat cancer in her daughter.    Review of Systems: Review of Systems  Constitutional: Negative.   HENT: Negative.   Respiratory: Positive for shortness of breath.   Cardiovascular: Negative.   Gastrointestinal: Negative.   Musculoskeletal: Negative.   Neurological: Negative.   Psychiatric/Behavioral: Negative.   All other systems reviewed and are negative.   PHYSICAL EXAM: VS:  BP 120/64 (BP Location: Right Arm, Patient Position: Sitting, Cuff Size: Large)   Pulse 72   Ht 5\' 1"  (1.549 m)   Wt 209 lb 2 oz (94.9 kg)   SpO2 98%   BMI 39.51 kg/m  , BMI Body mass index is 39.51 kg/m. GEN: Well nourished, well developed, in no acute distress, obese HEENT: normal Neck: no JVD, carotid bruits, or masses Cardiac: RRR; 3/6 systolic ejection murmur  appreciated right sternal border Respiratory:  clear to auscultation bilaterally, normal work of breathing GI: soft, nontender, nondistended, + BS MS: no deformity or atrophy Skin: warm and dry, no rash Neuro:  Strength and sensation are intact Psych: euthymic mood, full affect   Recent Labs: No results found for requested labs within last 8760 hours.    Lipid Panel Lab Results  Component Value Date   CHOL 162 08/15/2019   HDL 43 08/15/2019   [place] 98 08/15/2019   TRIG 117 08/15/2019      Wt Readings from Last 3 Encounters:  02/28/21 209 lb 2 oz (94.9 kg)  01/06/21 208 lb (94.3 kg)  12/08/20 211 lb (95.7 kg)      ASSESSMENT AND PLAN:  Problem List Items Addressed This Visit      Cardiology Problems   HTN (hypertension)   High cholesterol   CAD (coronary  artery disease)    Other Visit Diagnoses    Moderate aortic valve stenosis    -  Primary   Chronic shortness of breath         Moderate aortic valve stenosis Recent echocardiograms reviewed with her in detail, Recommend repeat echocardiogram April 2023 Discussed open surgery versus TAVR details She would much prefer TAVR  Shortness of breath Likely multifactorial including weight, deconditioning, obesity, prior COPD, moderate aortic valve stenosis Recommended weight loss, walking program for conditioning  Essential hypertension Blood pressure is well controlled on today's visit. No changes made to the medications.  Hyperlipidemia Cholesterol is at goal on the current lipid regimen. No changes to the medications were made.  COPD Former smoker, not on oxygen Recommended weight loss, working on conditioning    Total encounter time more than 60 minutes  Greater than 50% was spent in counseling and coordination of care with the patient    Signed, Esmond Plants, M.D., Ph.D. [place], [place]

## 2021-02-28 NOTE — Patient Instructions (Signed)
Medication Instructions:  No changes  If you need a refill on your cardiac medications before your next appointment, please call your pharmacy.    Lab work: No new labs needed   If you have labs (blood work) drawn today and your tests are completely normal, you will receive your results only by: . MyChart Message (if you have MyChart) OR . A paper copy in the mail If you have any lab test that is abnormal or we need to change your treatment, we will call you to review the results.   Testing/Procedures: No new testing needed   Follow-Up: At [facility], you and your health needs are our priority.  As part of our continuing mission to provide you with exceptional heart care, we have created designated Provider Care Teams.  These Care Teams include your primary Cardiologist (physician) and Advanced Practice Providers (APPs -  Physician Assistants and Nurse Practitioners) who all work together to provide you with the care you need, when you need it.  . You will need a follow up appointment as needed  . Providers on your designated Care Team:   . Christopher Berge, NP . Ryan Dunn, PA-C . Jacquelyn Visser, PA-C  Any Other Special Instructions Will Be Listed Below (If Applicable).  COVID-19 Vaccine Information can be found at: [email or web address] For questions related to vaccine distribution or appointments, please email [email or web address] or call [phone].     

## 2021-04-25 DIAGNOSIS — Z6841 Body Mass Index (BMI) 40.0 and over, adult: Secondary | ICD-10-CM | POA: Diagnosis not present

## 2021-05-09 DIAGNOSIS — R609 Edema, unspecified: Secondary | ICD-10-CM | POA: Diagnosis not present

## 2021-05-09 DIAGNOSIS — J449 Chronic obstructive pulmonary disease, unspecified: Secondary | ICD-10-CM | POA: Diagnosis not present

## 2021-05-09 DIAGNOSIS — Z9989 Dependence on other enabling machines and devices: Secondary | ICD-10-CM | POA: Diagnosis not present

## 2021-05-09 DIAGNOSIS — I10 Essential (primary) hypertension: Secondary | ICD-10-CM | POA: Diagnosis not present

## 2021-05-09 DIAGNOSIS — I350 Nonrheumatic aortic (valve) stenosis: Secondary | ICD-10-CM | POA: Diagnosis not present

## 2021-05-09 DIAGNOSIS — E782 Mixed hyperlipidemia: Secondary | ICD-10-CM | POA: Diagnosis not present

## 2021-05-09 DIAGNOSIS — R0600 Dyspnea, unspecified: Secondary | ICD-10-CM | POA: Diagnosis not present

## 2021-05-09 DIAGNOSIS — G4733 Obstructive sleep apnea (adult) (pediatric): Secondary | ICD-10-CM | POA: Diagnosis not present

## 2021-05-09 DIAGNOSIS — Z9889 Other specified postprocedural states: Secondary | ICD-10-CM | POA: Diagnosis not present

## 2021-05-20 DIAGNOSIS — M545 Low back pain, unspecified: Secondary | ICD-10-CM | POA: Diagnosis not present

## 2021-05-20 DIAGNOSIS — S61411A Laceration without foreign body of right hand, initial encounter: Secondary | ICD-10-CM | POA: Diagnosis not present

## 2021-06-21 DIAGNOSIS — D485 Neoplasm of uncertain behavior of skin: Secondary | ICD-10-CM | POA: Diagnosis not present

## 2021-06-21 DIAGNOSIS — D225 Melanocytic nevi of trunk: Secondary | ICD-10-CM | POA: Diagnosis not present

## 2021-06-21 DIAGNOSIS — R208 Other disturbances of skin sensation: Secondary | ICD-10-CM | POA: Diagnosis not present

## 2021-07-01 ENCOUNTER — Other Ambulatory Visit: Payer: Self-pay | Admitting: Internal Medicine

## 2021-07-01 DIAGNOSIS — Z1231 Encounter for screening mammogram for malignant neoplasm of breast: Secondary | ICD-10-CM

## 2021-07-05 DIAGNOSIS — R058 Other specified cough: Secondary | ICD-10-CM | POA: Diagnosis not present

## 2021-07-05 DIAGNOSIS — R0981 Nasal congestion: Secondary | ICD-10-CM | POA: Diagnosis not present

## 2021-07-05 DIAGNOSIS — Z03818 Encounter for observation for suspected exposure to other biological agents ruled out: Secondary | ICD-10-CM | POA: Diagnosis not present

## 2021-07-05 DIAGNOSIS — J069 Acute upper respiratory infection, unspecified: Secondary | ICD-10-CM | POA: Diagnosis not present

## 2021-07-07 DIAGNOSIS — R7303 Prediabetes: Secondary | ICD-10-CM | POA: Diagnosis not present

## 2021-07-07 DIAGNOSIS — I10 Essential (primary) hypertension: Secondary | ICD-10-CM | POA: Diagnosis not present

## 2021-07-07 DIAGNOSIS — R799 Abnormal finding of blood chemistry, unspecified: Secondary | ICD-10-CM | POA: Diagnosis not present

## 2021-07-12 IMAGING — MG DIGITAL SCREENING BILAT W/ TOMO W/ CAD
8 series · 8 of 24 positions shown · non-contrast
Comparison: Previous exam(s).

CLINICAL DATA: Screening.

EXAM:
DIGITAL SCREENING BILATERAL MAMMOGRAM WITH TOMO AND CAD

[R MLO synth-2D]
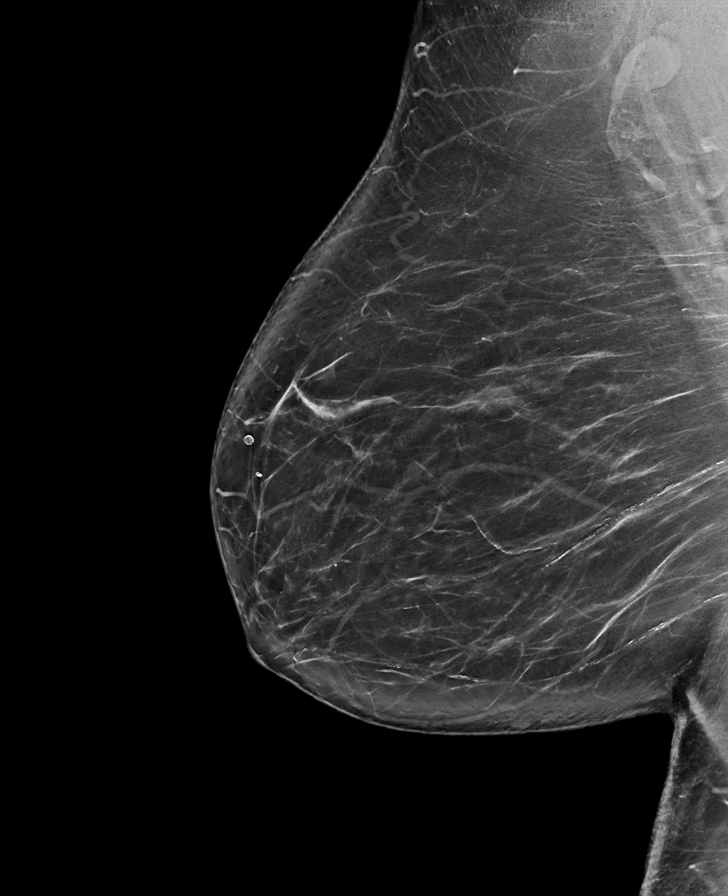

[R CC synth-2D]
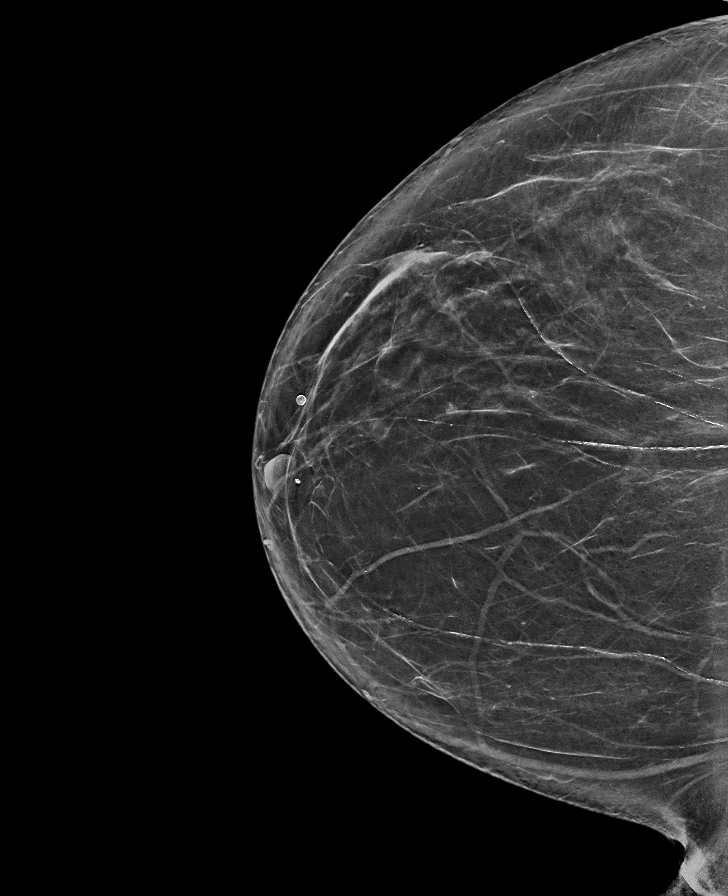

[L MLO synth-2D]
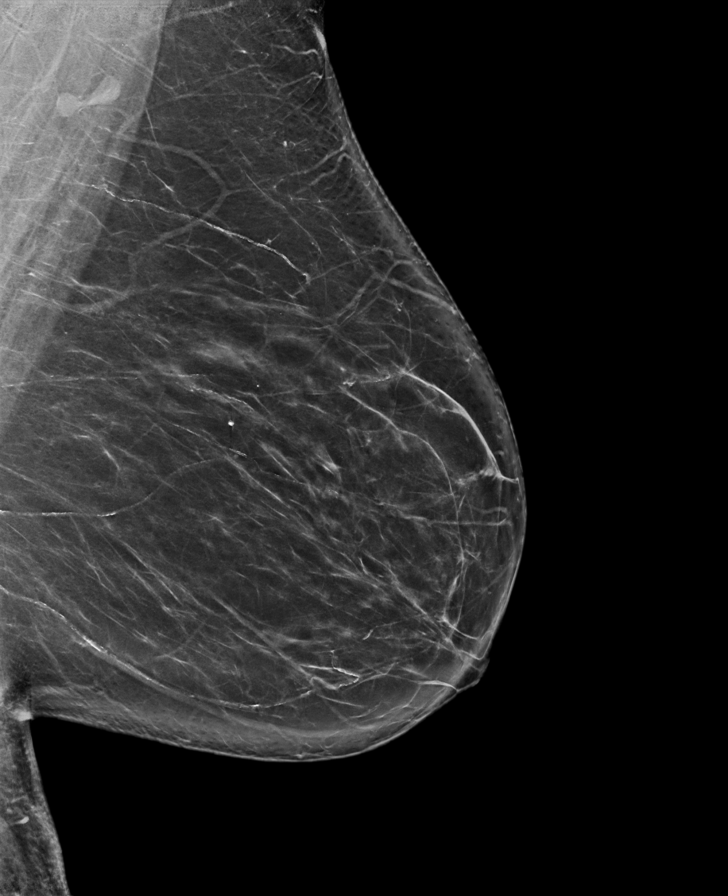

[L CC synth-2D]
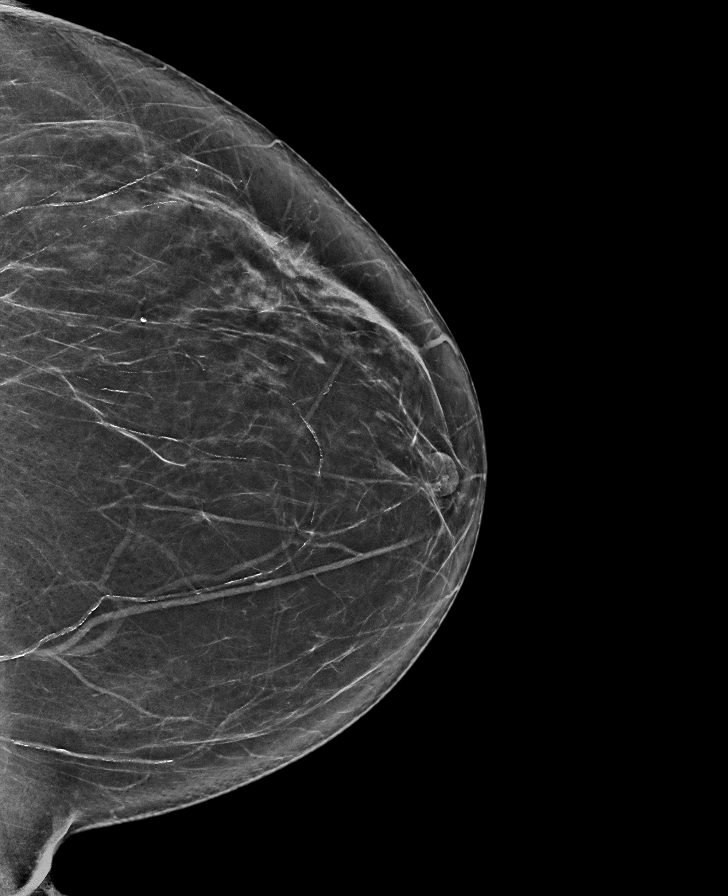

[L CC tomo · tomo slice 39/78.0]
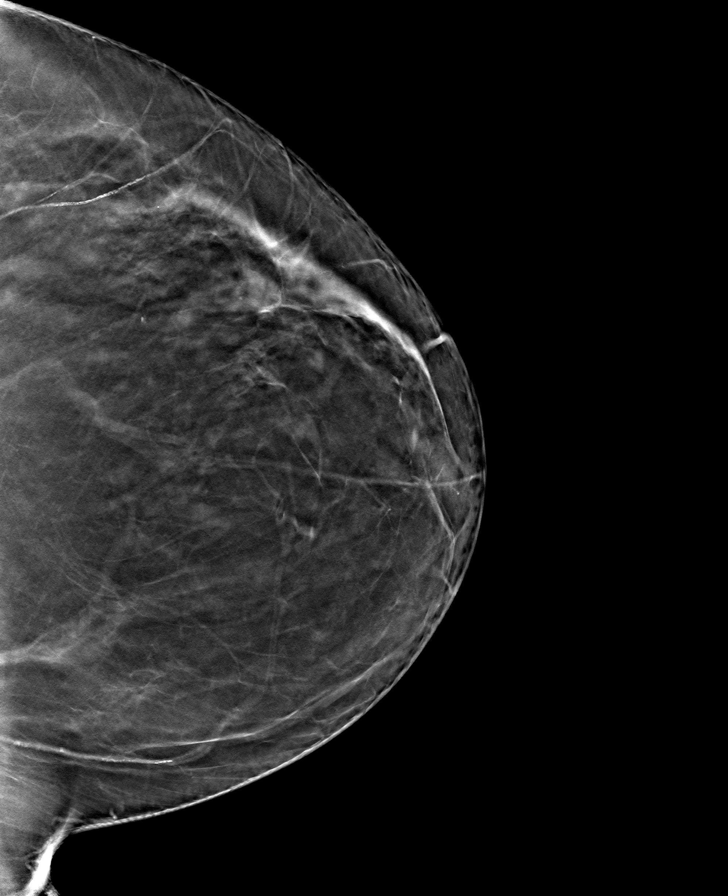

[R MLO tomo · tomo slice 47/94.0]
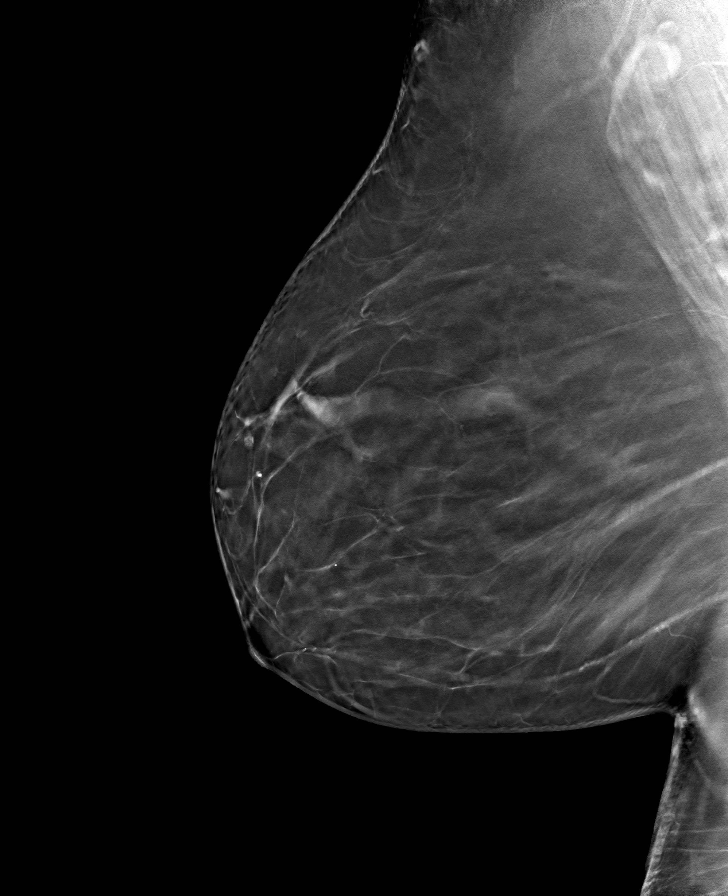

[R CC tomo · tomo slice 41/80.0]
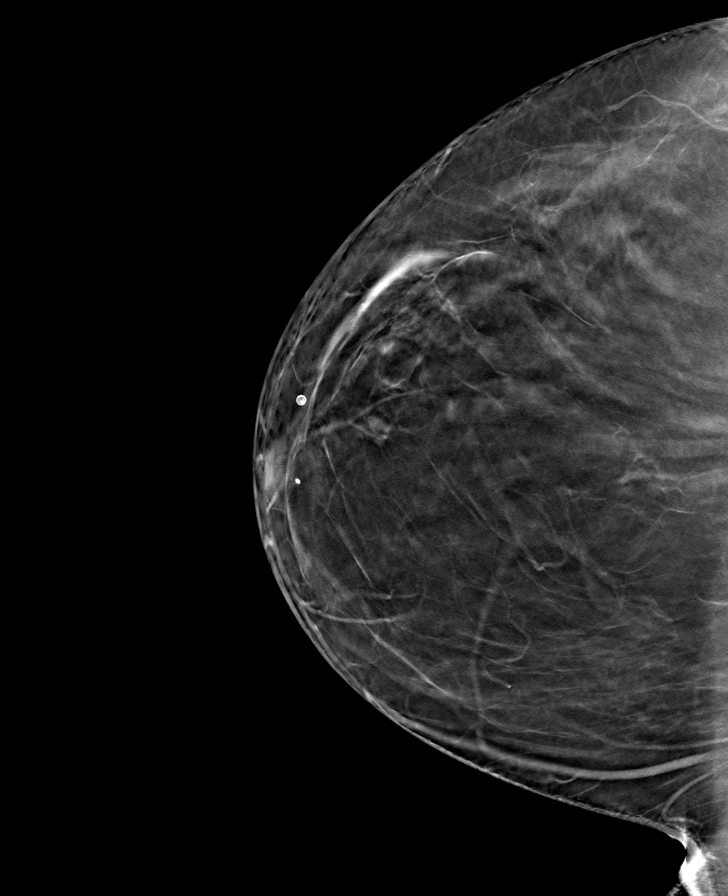

[L MLO tomo · tomo slice 45/90.0]
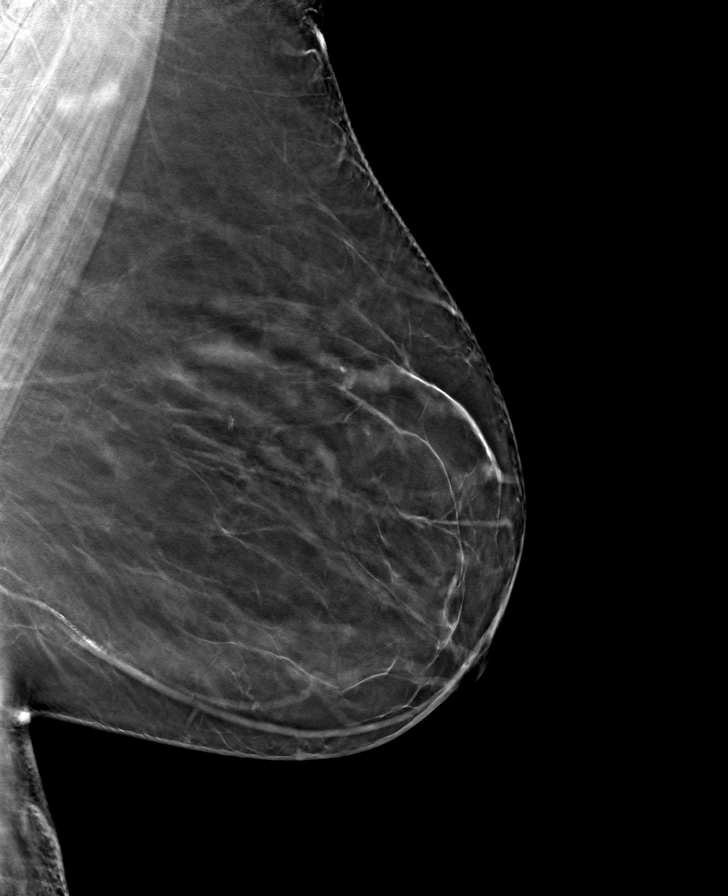

[8 of 24 positions shown; findings below may reference images not displayed]

ACR Breast Density Category b: There are scattered areas of
fibroglandular density.
FINDINGS: There are no findings suspicious for malignancy. Images were
processed with CAD.
IMPRESSION: No mammographic evidence of malignancy. A result letter of this
screening mammogram will be mailed directly to the patient.

RECOMMENDATION:
Screening mammogram in one year. (Code:[id number])

BI-RADS CATEGORY  1: Negative.

## 2021-07-12 NOTE — Progress Notes (Deleted)
07/13/2021 4:43 PM   Marcia Livingston 06-24-1946 [id number]  Referring provider: Baxter Hire, MD [place],  [place] [address]  No chief complaint on file.  Urologic history: 1.  Overactive bladder Symptoms frequency, urgency, urge incontinence Marked improvement on Myrbetriq  HPI: 75 y.o. female presents for 50-month follow-up.  Doing well since last visit No bothersome LUTS Denies dysuria, gross hematuria Denies flank, abdominal or pelvic pain    PMH: Past Medical History:  Diagnosis Date   Anxiety    Arthritis    CAD (coronary artery disease)    Cancer (HCC)    SKIN   Colon polyps    COPD (chronic obstructive pulmonary disease) (HCC)    Depression    Depression    Fibrocystic breast disease    GERD (gastroesophageal reflux disease)    Heart murmur    High cholesterol    Hyperlipidemia    Hypertension    IBS (irritable bowel syndrome)    IBS (irritable bowel syndrome)    Obesity    Osteoporosis    Overactive bladder    Sleep apnea    Sleep apnea     Surgical History: Past Surgical History:  Procedure Laterality Date   ABDOMINAL HYSTERECTOMY     total   APPENDECTOMY     BREAST EXCISIONAL BIOPSY Right YRS AGO   NEG   CATARACT EXTRACTION W/PHACO Right 02/01/2016   Procedure: CATARACT EXTRACTION PHACO AND INTRAOCULAR LENS PLACEMENT ([place]);  Surgeon: Birder Robson, MD;  Location: [facility] ORS;  Service: Ophthalmology;  Laterality: Right;  [place]    00:37.2 AP%  20.7% CDE  7.69 fluid pack lot # [id number]   CATARACT EXTRACTION W/PHACO Left 02/22/2016   Procedure: CATARACT EXTRACTION PHACO AND INTRAOCULAR LENS PLACEMENT ([facility]);  Surgeon: Birder Robson, MD;  Location: [facility] ORS;  Service: Ophthalmology;  Laterality: Left;  [place] 00:40 AP% 19.9 CDE 8.06 fluid pack lot # [id number]   CHOLECYSTECTOMY     COLONOSCOPY WITH PROPOFOL N/A 01/13/2016   Procedure: COLONOSCOPY WITH PROPOFOL;  Surgeon: Manya Silvas, MD;  Location: [facility] ENDOSCOPY;  Service:  Endoscopy;  Laterality: N/A;   FOOT SURGERY Left    for breakdown of arch   OVARIAN CYST SURGERY     SKIN CANCER EXCISION Right 05/16/2017   Right Shin    Home Medications:  Allergies as of 07/13/2021       Reactions   Penicillins Rash   Has patient had a PCN reaction causing immediate rash, facial/tongue/throat swelling, SOB or lightheadedness with hypotension: [id number]} Has patient had a PCN reaction causing severe rash involving mucus membranes or skin necrosis: no:[id number]} Has patient had a PCN reaction that required hospitalization no:[id number]} Has patient had a PCN reaction occurring within the last 10 years: no:[id number]} If all of the above answers are "NO", then may proceed with Cephalosporin use.   Codeine Hives   Sulfa Antibiotics Hives        Medication List        Accurate as of July 12, 2021  4:43 PM. If you have any questions, ask your nurse or doctor.          aspirin EC 81 MG tablet Take 81 mg by mouth daily.   buPROPion 150 MG 24 hr tablet Commonly known as: WELLBUTRIN XL TAKE 1 TABLET BY MOUTH  DAILY   busPIRone 15 MG tablet Commonly known as: BUSPAR Take 15 mg by mouth 2 (two) times daily.   calcium carbonate 500 MG chewable tablet  Commonly known as: TUMS - dosed in mg elemental calcium Chew 1 tablet by mouth 3 (three) times daily with meals.   cholecalciferol 1000 units tablet Commonly known as: VITAMIN D Take 1,000 Units by mouth daily.   colestipol 1 g tablet Commonly known as: COLESTID TAKE 1 TABLET BY MOUTH  DAILY   doxycycline 100 MG tablet Commonly known as: VIBRA-TABS Take 1 tablet (100 mg total) by mouth 2 (two) times daily.   DULoxetine 60 MG capsule Commonly known as: CYMBALTA TAKE 1 CAPSULE BY MOUTH  DAILY   gabapentin 100 MG capsule Commonly known as: NEURONTIN Take by mouth.   hydrochlorothiazide 25 MG tablet Commonly known as: HYDRODIURIL TAKE 1 TABLET BY MOUTH  DAILY   losartan 50 MG tablet Commonly  known as: COZAAR TAKE 1 TABLET BY MOUTH  DAILY   meclizine 25 MG tablet Commonly known as: ANTIVERT Take 25 mg by mouth 3 (three) times daily as needed for dizziness.   mirabegron ER 50 MG Tb24 tablet Commonly known as: MYRBETRIQ Take 1 tablet (50 mg total) by mouth daily.   rosuvastatin 5 MG tablet Commonly known as: CRESTOR Take 5 mg by mouth 3 (three) times a week.   traZODone 50 MG tablet Commonly known as: DESYREL Take 1-2 tablets at bedtime for sleep.   Trelegy Ellipta 100-62.5-25 MCG/INH Aepb Generic drug: Fluticasone-Umeclidin-Vilant Inhale 1 puff into the lungs daily.        Allergies:  Allergies  Allergen Reactions   Penicillins Rash    Has patient had a PCN reaction causing immediate rash, facial/tongue/throat swelling, SOB or lightheadedness with hypotension: [id number]} Has patient had a PCN reaction causing severe rash involving mucus membranes or skin necrosis: no:[id number]} Has patient had a PCN reaction that required hospitalization no:[id number]} Has patient had a PCN reaction occurring within the last 10 years: no:[id number]} If all of the above answers are "NO", then may proceed with Cephalosporin use.    Codeine Hives   Sulfa Antibiotics Hives    Family History: Family History  Problem Relation Age of Onset   Colon cancer Mother        thought to be mets from breast cancer   Stroke Mother    Hypertension Mother    Heart attack Mother    Breast cancer Mother 57   Emphysema Father    Prostate cancer Father    Throat cancer Daughter    Breast cancer Maternal Aunt        11's   Rheum arthritis Daughter    Hypertension Daughter    Reye's syndrome Daughter        history of   Hepatitis C Daughter    Ovarian cancer Neg Hx     Social History:  reports that she quit smoking about 24 years ago. Her smoking use included cigarettes. She has a 30.00 pack-year smoking history. She has never used smokeless tobacco. She reports current alcohol use. She  reports that she does not use drugs.   Physical Exam: There were no vitals taken for this visit.  Constitutional:  Alert and oriented, No acute distress. HEENT: [place] AT, moist mucus membranes.  Trachea midline, no masses. Cardiovascular: No clubbing, cyanosis, or edema. Respiratory: Normal respiratory effort, no increased work of breathing. GI: Abdomen is soft, nontender, nondistended, no abdominal masses GU: No CVA tenderness Lymph: No cervical or inguinal lymphadenopathy. Skin: No rashes, bruises or suspicious lesions. Neurologic: Grossly intact, no focal deficits, moving all 4 extremities. Psychiatric: Normal mood and affect.  Laboratory  Data: Lab Results  Component Value Date   WBC 7.7 01/30/2020   HGB 13.8 01/30/2020   HCT 42.2 01/30/2020   MCV 89 01/30/2020   PLT 267 01/30/2020    Lab Results  Component Value Date   CREATININE 0.88 01/30/2020    No results found for: PSA  No results found for: TESTOSTERONE  Lab Results  Component Value Date   HGBA1C 5.6 01/30/2020    Urinalysis    Component Value Date/Time   APPEARANCEUR Hazy (A) 12/08/2020 1018   GLUCOSEU Negative 12/08/2020 1018   BILIRUBINUR Negative 12/08/2020 1018   PROTEINUR Negative 12/08/2020 1018   NITRITE Negative 12/08/2020 1018   LEUKOCYTESUR Negative 12/08/2020 1018    Lab Results  Component Value Date   LABMICR See below: 12/08/2020   WBCUA 6-10 (A) 12/08/2020   LABEPIT 0-10 12/08/2020   BACTERIA Few 12/08/2020    Pertinent Imaging: *** No results found for this or any previous visit.  No results found for this or any previous visit.  No results found for this or any previous visit.  No results found for this or any previous visit.  No results found for this or any previous visit.  No results found for this or any previous visit.  No results found for this or any previous visit.  No results found for this or any previous visit.   Assessment & Plan:    There are no  diagnoses linked to this encounter.  No follow-ups on file.  Marcia Livingston, [place] [address] [place], [place] [address] [phone]

## 2021-07-13 ENCOUNTER — Ambulatory Visit: Payer: Medicare Other | Admitting: Urology

## 2021-07-14 DIAGNOSIS — I10 Essential (primary) hypertension: Secondary | ICD-10-CM | POA: Diagnosis not present

## 2021-07-14 DIAGNOSIS — R7303 Prediabetes: Secondary | ICD-10-CM | POA: Diagnosis not present

## 2021-07-14 DIAGNOSIS — J449 Chronic obstructive pulmonary disease, unspecified: Secondary | ICD-10-CM | POA: Diagnosis not present

## 2021-07-14 DIAGNOSIS — F419 Anxiety disorder, unspecified: Secondary | ICD-10-CM | POA: Diagnosis not present

## 2021-07-14 DIAGNOSIS — Z1211 Encounter for screening for malignant neoplasm of colon: Secondary | ICD-10-CM | POA: Diagnosis not present

## 2021-07-14 DIAGNOSIS — Z6841 Body Mass Index (BMI) 40.0 and over, adult: Secondary | ICD-10-CM | POA: Diagnosis not present

## 2021-07-14 DIAGNOSIS — E782 Mixed hyperlipidemia: Secondary | ICD-10-CM | POA: Diagnosis not present

## 2021-07-14 DIAGNOSIS — F32A Depression, unspecified: Secondary | ICD-10-CM | POA: Diagnosis not present

## 2021-07-14 DIAGNOSIS — Z0000 Encounter for general adult medical examination without abnormal findings: Secondary | ICD-10-CM | POA: Diagnosis not present

## 2021-07-15 ENCOUNTER — Ambulatory Visit: Payer: Medicare Other | Admitting: Urology

## 2021-07-27 ENCOUNTER — Ambulatory Visit: Payer: Medicare Other | Admitting: Urology

## 2021-08-02 DIAGNOSIS — R0609 Other forms of dyspnea: Secondary | ICD-10-CM | POA: Diagnosis not present

## 2021-08-02 DIAGNOSIS — R002 Palpitations: Secondary | ICD-10-CM | POA: Diagnosis not present

## 2021-08-05 ENCOUNTER — Ambulatory Visit
Admission: RE | Admit: 2021-08-05 | Discharge: 2021-08-05 | Disposition: A | Discharge status: Home / Self Care | Payer: Medicare Other | Source: Ambulatory Visit | Attending: Internal Medicine | Admitting: Internal Medicine

## 2021-08-05 ENCOUNTER — Other Ambulatory Visit: Payer: Self-pay

## 2021-08-05 DIAGNOSIS — Z1231 Encounter for screening mammogram for malignant neoplasm of breast: Secondary | ICD-10-CM | POA: Insufficient documentation

## 2021-08-30 DIAGNOSIS — Z23 Encounter for immunization: Secondary | ICD-10-CM | POA: Diagnosis not present

## 2021-09-05 DIAGNOSIS — R002 Palpitations: Secondary | ICD-10-CM | POA: Diagnosis not present

## 2021-09-06 DIAGNOSIS — I350 Nonrheumatic aortic (valve) stenosis: Secondary | ICD-10-CM | POA: Diagnosis not present

## 2021-09-06 DIAGNOSIS — Z23 Encounter for immunization: Secondary | ICD-10-CM | POA: Diagnosis not present

## 2021-09-06 DIAGNOSIS — E782 Mixed hyperlipidemia: Secondary | ICD-10-CM | POA: Diagnosis not present

## 2021-09-06 DIAGNOSIS — R0609 Other forms of dyspnea: Secondary | ICD-10-CM | POA: Diagnosis not present

## 2021-09-06 DIAGNOSIS — R002 Palpitations: Secondary | ICD-10-CM | POA: Diagnosis not present

## 2021-09-06 DIAGNOSIS — R609 Edema, unspecified: Secondary | ICD-10-CM | POA: Diagnosis not present

## 2021-09-06 DIAGNOSIS — Z9989 Dependence on other enabling machines and devices: Secondary | ICD-10-CM | POA: Diagnosis not present

## 2021-09-06 DIAGNOSIS — Z9889 Other specified postprocedural states: Secondary | ICD-10-CM | POA: Diagnosis not present

## 2021-09-06 DIAGNOSIS — G4733 Obstructive sleep apnea (adult) (pediatric): Secondary | ICD-10-CM | POA: Diagnosis not present

## 2021-09-06 DIAGNOSIS — I10 Essential (primary) hypertension: Secondary | ICD-10-CM | POA: Diagnosis not present

## 2021-09-26 DIAGNOSIS — Z800 Family history of malignant neoplasm of digestive organs: Secondary | ICD-10-CM | POA: Diagnosis not present

## 2021-09-26 DIAGNOSIS — Z86010 Personal history of colonic polyps: Secondary | ICD-10-CM | POA: Diagnosis not present

## 2021-09-26 DIAGNOSIS — K219 Gastro-esophageal reflux disease without esophagitis: Secondary | ICD-10-CM | POA: Diagnosis not present

## 2021-12-14 ENCOUNTER — Encounter
Admission: RE | Disposition: A | Discharge status: Home / Self Care | Payer: Self-pay | Source: Ambulatory Visit | Attending: Internal Medicine

## 2021-12-14 ENCOUNTER — Ambulatory Visit: Payer: Medicare Other | Admitting: Certified Registered Nurse Anesthetist

## 2021-12-14 ENCOUNTER — Encounter: Payer: Self-pay | Admitting: Internal Medicine

## 2021-12-14 ENCOUNTER — Other Ambulatory Visit: Payer: Self-pay

## 2021-12-14 ENCOUNTER — Ambulatory Visit
Admission: RE | Admit: 2021-12-14 | Discharge: 2021-12-14 | Disposition: A | Discharge status: Home / Self Care | Payer: Medicare Other | Source: Ambulatory Visit | Attending: Internal Medicine | Admitting: Internal Medicine

## 2021-12-14 DIAGNOSIS — I10 Essential (primary) hypertension: Secondary | ICD-10-CM | POA: Diagnosis not present

## 2021-12-14 DIAGNOSIS — Z6838 Body mass index (BMI) 38.0-38.9, adult: Secondary | ICD-10-CM | POA: Insufficient documentation

## 2021-12-14 DIAGNOSIS — Z800 Family history of malignant neoplasm of digestive organs: Secondary | ICD-10-CM | POA: Insufficient documentation

## 2021-12-14 DIAGNOSIS — J449 Chronic obstructive pulmonary disease, unspecified: Secondary | ICD-10-CM | POA: Insufficient documentation

## 2021-12-14 DIAGNOSIS — G4730 Sleep apnea, unspecified: Secondary | ICD-10-CM | POA: Diagnosis not present

## 2021-12-14 DIAGNOSIS — K640 First degree hemorrhoids: Secondary | ICD-10-CM | POA: Insufficient documentation

## 2021-12-14 DIAGNOSIS — F419 Anxiety disorder, unspecified: Secondary | ICD-10-CM | POA: Diagnosis not present

## 2021-12-14 DIAGNOSIS — N3281 Overactive bladder: Secondary | ICD-10-CM | POA: Insufficient documentation

## 2021-12-14 DIAGNOSIS — I2510 Atherosclerotic heart disease of native coronary artery without angina pectoris: Secondary | ICD-10-CM | POA: Insufficient documentation

## 2021-12-14 DIAGNOSIS — E669 Obesity, unspecified: Secondary | ICD-10-CM | POA: Insufficient documentation

## 2021-12-14 DIAGNOSIS — F32A Depression, unspecified: Secondary | ICD-10-CM | POA: Diagnosis not present

## 2021-12-14 DIAGNOSIS — M1990 Unspecified osteoarthritis, unspecified site: Secondary | ICD-10-CM | POA: Diagnosis not present

## 2021-12-14 DIAGNOSIS — Z86010 Personal history of colonic polyps: Secondary | ICD-10-CM | POA: Diagnosis present

## 2021-12-14 DIAGNOSIS — M810 Age-related osteoporosis without current pathological fracture: Secondary | ICD-10-CM | POA: Diagnosis not present

## 2021-12-14 DIAGNOSIS — K219 Gastro-esophageal reflux disease without esophagitis: Secondary | ICD-10-CM | POA: Diagnosis not present

## 2021-12-14 DIAGNOSIS — Z87891 Personal history of nicotine dependence: Secondary | ICD-10-CM | POA: Insufficient documentation

## 2021-12-14 DIAGNOSIS — K589 Irritable bowel syndrome without diarrhea: Secondary | ICD-10-CM | POA: Insufficient documentation

## 2021-12-14 DIAGNOSIS — Z1211 Encounter for screening for malignant neoplasm of colon: Secondary | ICD-10-CM | POA: Insufficient documentation

## 2021-12-14 DIAGNOSIS — K5730 Diverticulosis of large intestine without perforation or abscess without bleeding: Secondary | ICD-10-CM | POA: Diagnosis not present

## 2021-12-14 DIAGNOSIS — D123 Benign neoplasm of transverse colon: Secondary | ICD-10-CM | POA: Insufficient documentation

## 2021-12-14 HISTORY — PX: COLONOSCOPY WITH PROPOFOL: SHX5780

## 2021-12-14 SURGERY — COLONOSCOPY WITH PROPOFOL
Anesthesia: General

## 2021-12-14 MED ORDER — PROPOFOL 10 MG/ML IV BOLUS
INTRAVENOUS | Status: DC | PRN
  Administered 2021-12-14: 30 mg via INTRAVENOUS
  Administered 2021-12-14: 70 mg via INTRAVENOUS

## 2021-12-14 MED ORDER — PROPOFOL 500 MG/50ML IV EMUL
INTRAVENOUS | Status: DC | PRN
  Administered 2021-12-14: 150 ug/kg/min via INTRAVENOUS

## 2021-12-14 MED ORDER — SODIUM CHLORIDE 0.9 % IV SOLN: INTRAVENOUS | Status: DC

## 2021-12-14 NOTE — Transfer of Care (Signed)
Immediate Anesthesia Transfer of Care Note ? ?Patient: Marcia Livingston ? ?Procedure(s) Performed: COLONOSCOPY WITH PROPOFOL ? ?Patient Location: PACU ? ?Anesthesia Type:General ? ?Level of Consciousness: awake and alert  ? ?Airway & Oxygen Therapy: Patient Spontanous Breathing ? ?Post-op Assessment: Report given to RN and Post -op Vital signs reviewed and stable ? ?Post vital signs: Reviewed and stable ? ?Last Vitals:  ?Vitals Value Taken Time  ?BP    ?Temp    ?Pulse 65 12/14/21 1432  ?Resp 15 12/14/21 1432  ?SpO2 96 % 12/14/21 1432  ?Vitals shown include unvalidated device data. ? ?Last Pain:  ?Vitals:  ? 12/14/21 1257  ?TempSrc: Temporal  ?PainSc: 0-No pain  ?   ? ?  ? ?Complications: No notable events documented. ?

## 2021-12-14 NOTE — Anesthesia Procedure Notes (Signed)
Date/Time: 12/14/2021 2:16 PM ?Performed by: Demetrius Charity, CRNA ?Pre-anesthesia Checklist: Patient identified, Emergency Drugs available, Suction available, Patient being monitored and Timeout performed ?Patient Re-evaluated:Patient Re-evaluated prior to induction ?Oxygen Delivery Method: Nasal cannula ?Induction Type: IV induction ?Placement Confirmation: CO2 detector and positive ETCO2 ? ? ? ? ?

## 2021-12-14 NOTE — Anesthesia Preprocedure Evaluation (Signed)
Anesthesia Evaluation  ?Patient identified by MRN, date of birth, ID band ?Patient awake ? ? ? ?Reviewed: ?Allergy & Precautions, NPO status , Patient's Chart, lab work & pertinent test results ? ?Airway ?Mallampati: II ? ?TM Distance: >3 FB ?Neck ROM: Full ? ? ? Dental ? ?(+) Teeth Intact ?  ?Pulmonary ?neg pulmonary ROS, sleep apnea and Continuous Positive Airway Pressure Ventilation , COPD, former smoker,  ?  ?Pulmonary exam normal ? ?+ decreased breath sounds ? ? ? ? ? Cardiovascular ?Exercise Tolerance: Good ?hypertension, + angina with exertion + CAD  ?negative cardio ROS ?Normal cardiovascular exam ?Rhythm:Regular Rate:Normal ? ? ?  ?Neuro/Psych ?Anxiety Depression negative neurological ROS ? negative psych ROS  ? GI/Hepatic ?negative GI ROS, Neg liver ROS, GERD  ,  ?Endo/Other  ?negative endocrine ROS ? Renal/GU ?negative Renal ROS  ?negative genitourinary ?  ?Musculoskeletal ?negative musculoskeletal ROS ?(+)  ? Abdominal ?(+) + obese,   ?Peds ?negative pediatric ROS ?(+)  Hematology ?negative hematology ROS ?(+)   ?Anesthesia Other Findings ?Past Medical History: ?No date: Anxiety ?No date: Arthritis ?No date: CAD (coronary artery disease) ?No date: Cancer [facility]) ?    Comment:  SKIN ?No date: Colon polyps ?No date: COPD (chronic obstructive pulmonary disease) ([place]) ?No date: Depression ?No date: Depression ?No date: Fibrocystic breast disease ?No date: GERD (gastroesophageal reflux disease) ?No date: Heart murmur ?No date: High cholesterol ?No date: Hyperlipidemia ?No date: Hypertension ?No date: IBS (irritable bowel syndrome) ?No date: IBS (irritable bowel syndrome) ?No date: Obesity ?No date: Osteoporosis ?No date: Overactive bladder ?No date: Sleep apnea ?No date: Sleep apnea ? ?Past Surgical History: ?No date: ABDOMINAL HYSTERECTOMY ?    Comment:  total ?No date: APPENDECTOMY ?YRS AGO: BREAST EXCISIONAL BIOPSY; Right ?    Comment:  NEG ?02/01/2016: CATARACT EXTRACTION  W/PHACO; Right ?    Comment:  Procedure: CATARACT EXTRACTION PHACO AND INTRAOCULAR  ?             LENS PLACEMENT ([place]);  Surgeon: Birder Robson, MD;   ?             Location: [facility] ORS;  Service: Ophthalmology;  Laterality: ?             Right;  [place]    00:37.2 ?AP%  20.7% ?CDE  7.69 ?fluid pack  ?             lot # [id number] ?02/22/2016: CATARACT EXTRACTION W/PHACO; Left ?    Comment:  Procedure: CATARACT EXTRACTION PHACO AND INTRAOCULAR  ?             LENS PLACEMENT ([place]);  Surgeon: Birder Robson, MD;   ?             Location: [facility] ORS;  Service: Ophthalmology;  Laterality: ?             Left;  [place] 00:40 ?AP% 19.9 ?CDE 8.06 ?fluid pack lot #  ?             [id number] ?No date: CHOLECYSTECTOMY ?01/13/2016: COLONOSCOPY WITH PROPOFOL; N/A ?    Comment:  Procedure: COLONOSCOPY WITH PROPOFOL;  Surgeon: Herbie Baltimore T ?             Vira Agar, MD;  Location: [place];  Service:  ?             Endoscopy;  Laterality: N/A; ?No date: FOOT SURGERY; Left ?    Comment:  for breakdown of arch ?No date: OVARIAN CYST SURGERY ?05/16/2017: SKIN  CANCER EXCISION; Right ?    Comment:  Right Shin ? ?BMI   ? Body Mass Index: 38.11 kg/m?  ?  ? ? Reproductive/Obstetrics ?negative OB ROS ? ?  ? ? ? ? ? ? ? ? ? ? ? ? ? ?  ?  ? ? ? ? ? ? ? ? ?Anesthesia Physical ?Anesthesia Plan ? ?ASA: 3 ? ?Anesthesia Plan: General  ? ?Post-op Pain Management:   ? ?Induction: Intravenous ? ?PONV Risk Score and Plan: Propofol infusion and TIVA ? ?Airway Management Planned: Natural Airway and Nasal Cannula ? ?Additional Equipment:  ? ?Intra-op Plan:  ? ?Post-operative Plan:  ? ?Informed Consent: I have reviewed the patients History and Physical, chart, labs and discussed the procedure including the risks, benefits and alternatives for the proposed anesthesia with the patient or authorized representative who has indicated his/her understanding and acceptance.  ? ? ? ?Dental Advisory Given ? ?Plan Discussed with: Anesthesiologist, CRNA and Surgeon ? ?Anesthesia Plan  Comments:   ? ? ? ? ? ? ?Anesthesia Quick Evaluation ? ?

## 2021-12-14 NOTE — Op Note (Signed)
[facility] ?Gastroenterology ?Patient Name: Marcia Livingston ?Procedure Date: 12/14/2021 2:06 PM ?MRN: [id number] ?Account #: [id number] ?Date of Birth: July 31, 1946 ?Admit Type: Outpatient ?Age: 76 ?Room: [facility] ENDO ROOM 2 ?Gender: Female ?Note Status: Finalized ?Instrument Name: Colonoscope [id number] ?Procedure:             Colonoscopy ?Indications:           Surveillance: Personal history of adenomatous polyps  ?                       on last colonoscopy > 5 years ago ?Providers:             Benay Pike. Alice Reichert MD, MD ?Referring MD:          Baxter Hire, MD (Referring MD) ?Medicines:             Propofol per Anesthesia ?Complications:         No immediate complications. ?Procedure:             Pre-Anesthesia Assessment: ?                       - The risks and benefits of the procedure and the  ?                       sedation options and risks were discussed with the  ?                       patient. All questions were answered and informed  ?                       consent was obtained. ?                       - Patient identification and proposed procedure were  ?                       verified prior to the procedure by the nurse. The  ?                       procedure was verified in the procedure room. ?                       - ASA Grade Assessment: III - A patient with severe  ?                       systemic disease. ?                       - After reviewing the risks and benefits, the patient  ?                       was deemed in satisfactory condition to undergo the  ?                       procedure. ?                       After obtaining informed consent, the colonoscope was  ?  passed under direct vision. Throughout the procedure,  ?                       the patient's blood pressure, pulse, and oxygen  ?                       saturations were monitored continuously. The  ?                       Colonoscope was introduced through the anus and  ?                        advanced to the the cecum, identified by appendiceal  ?                       orifice and ileocecal valve. The colonoscopy was  ?                       performed without difficulty. The patient tolerated  ?                       the procedure well. The quality of the bowel  ?                       preparation was good. The ileocecal valve, appendiceal  ?                       orifice, and rectum were photographed. ?Findings: ?     The perianal and digital rectal examinations were normal. Pertinent  ?     negatives include normal sphincter tone and no palpable rectal lesions. ?     Non-bleeding internal hemorrhoids were found during retroflexion. The  ?     hemorrhoids were Grade I (internal hemorrhoids that do not prolapse). ?     Many small-mouthed diverticula were found in the sigmoid colon. ?     A 4 mm polyp was found in the distal transverse colon. The polyp was  ?     sessile. The polyp was removed with a jumbo cold forceps. Resection and  ?     retrieval were complete. ?     The exam was otherwise without abnormality. ?Impression:            - Non-bleeding internal hemorrhoids. ?                       - Diverticulosis in the sigmoid colon. ?                       - One 4 mm polyp in the distal transverse colon,  ?                       removed with a jumbo cold forceps. Resected and  ?                       retrieved. ?                       - The examination was otherwise normal. ?Recommendation:        - Patient has a contact number available for  ?  emergencies. The signs and symptoms of potential  ?                       delayed complications were discussed with the patient.  ?                       Return to normal activities tomorrow. Written  ?                       discharge instructions were provided to the patient. ?                       - Resume previous diet. ?                       - Continue present medications. ?                       - Repeat colonoscopy in 5 years for  surveillance. ?                       - Return to GI office PRN. ?                       - The findings and recommendations were discussed with  ?                       the patient. ?Procedure Code(s):     --- Professional --- ?                       [phone], Colonoscopy, flexible; with biopsy, single or  ?                       multiple ?Diagnosis Code(s):     --- Professional --- ?                       K57.30, Diverticulosis of large intestine without  ?                       perforation or abscess without bleeding ?                       K63.5, Polyp of colon ?                       K64.0, First degree hemorrhoids ?                       Z86.010, Personal history of colonic polyps ?CPT copyright 2019 American Medical Association. All rights reserved. ?The codes documented in this report are preliminary and upon coder review may  ?be revised to meet current compliance requirements. ?Efrain Sella MD, MD ?12/14/2021 2:30:48 PM ?This report has been signed electronically. ?Number of Addenda: 0 ?Note Initiated On: 12/14/2021 2:06 PM ?Scope Withdrawal Time: 0 hours 5 minutes 49 seconds  ?Total Procedure Duration: 0 hours 9 minutes 57 seconds  ?Estimated Blood Loss:  Estimated blood loss: none. ?     [facility] ?

## 2021-12-14 NOTE — H&P (Signed)
Outpatient short stay form Pre-procedure ?12/14/2021 2:07 PM ?Lorie Apley K. Alice Reichert, M.D. ? ?Primary Physician: Harrel Lemon, M.D. ? ?Reason for visit:  Personal history of adenomatous colon polyps ? ?History of present illness:  Ms. Kennerson presents to the [facility] at the request of her PCP for chief complaint of high-risk colon cancer surveillance. Last colonoscopy performed 12/2015 by Dr. Vira Agar removed two diminutive tubular adenomas in proximal transverse colon. There is a family history of colon cancer in her mother diagnosed at age 44. ? ? ? ?Current Facility-Administered Medications:  ?  0.9 %  sodium chloride infusion, , Intravenous, Continuous, [place], Benay Pike, MD, Last Rate: 20 mL/hr at 12/14/21 1357, Continued from Pre-op at 12/14/21 1357 ? ?Medications Prior to Admission  ?Medication Sig Dispense Refill Last Dose  ? aspirin EC 81 MG tablet Take 81 mg by mouth daily.   Past Week  ? buPROPion (WELLBUTRIN XL) 150 MG 24 hr tablet TAKE 1 TABLET BY MOUTH  DAILY 90 tablet 1 12/13/2021  ? busPIRone (BUSPAR) 15 MG tablet Take 15 mg by mouth 3 (three) times daily.   12/13/2021  ? calcium carbonate (TUMS - DOSED IN MG ELEMENTAL CALCIUM) 500 MG chewable tablet Chew 1 tablet by mouth 3 (three) times daily with meals.   12/13/2021  ? cholecalciferol (VITAMIN D) 1000 units tablet Take 1,000 Units by mouth daily.   12/13/2021  ? colestipol (COLESTID) 1 g tablet TAKE 1 TABLET BY MOUTH  DAILY 90 tablet 0 12/13/2021  ? doxycycline (VIBRA-TABS) 100 MG tablet Take 1 tablet (100 mg total) by mouth 2 (two) times daily. 20 tablet 0 12/13/2021  ? DULoxetine (CYMBALTA) 60 MG capsule TAKE 1 CAPSULE BY MOUTH  DAILY 90 capsule 1 12/13/2021  ? Fluticasone-Umeclidin-Vilant (TRELEGY ELLIPTA) 100-62.5-25 MCG/INH AEPB Inhale 1 puff into the lungs daily.    Past Week  ? gabapentin (NEURONTIN) 300 MG capsule Take 300 mg by mouth 3 (three) times daily.   12/13/2021  ? hydrochlorothiazide (HYDRODIURIL) 25 MG tablet TAKE 1 TABLET BY MOUTH  DAILY  90 tablet 3 12/13/2021  ? losartan (COZAAR) 50 MG tablet TAKE 1 TABLET BY MOUTH  DAILY 90 tablet 3 12/13/2021  ? meclizine (ANTIVERT) 25 MG tablet Take 25 mg by mouth 3 (three) times daily as needed for dizziness.   Past Month  ? mirabegron ER (MYRBETRIQ) 50 MG TB24 tablet Take 1 tablet (50 mg total) by mouth daily. 90 tablet 1 Past Month  ? rosuvastatin (CRESTOR) 5 MG tablet Take 5 mg by mouth 3 (three) times a week.   12/13/2021  ? traZODone (DESYREL) 50 MG tablet Take 1-2 tablets at bedtime for sleep. 180 tablet 1 12/13/2021  ? ? ? ?Allergies  ?Allergen Reactions  ? Penicillins Rash  ?  Has patient had a PCN reaction causing immediate rash, facial/tongue/throat swelling, SOB or lightheadedness with hypotension: [id number]} ?Has patient had a PCN reaction causing severe rash involving mucus membranes or skin necrosis: no:[id number]} ?Has patient had a PCN reaction that required hospitalization no:[id number]} ?Has patient had a PCN reaction occurring within the last 10 years: [id number]} ?If all of the above answers are "NO", then may proceed with Cephalosporin use. ?  ? Codeine Hives  ? Sulfa Antibiotics Hives  ? ? ? ?Past Medical History:  ?Diagnosis Date  ? Anxiety   ? Arthritis   ? CAD (coronary artery disease)   ? Cancer [facility])   ? SKIN  ? Colon polyps   ? COPD (chronic obstructive pulmonary disease) ([place])   ?  Depression   ? Depression   ? Fibrocystic breast disease   ? GERD (gastroesophageal reflux disease)   ? Heart murmur   ? High cholesterol   ? Hyperlipidemia   ? Hypertension   ? IBS (irritable bowel syndrome)   ? IBS (irritable bowel syndrome)   ? Obesity   ? Osteoporosis   ? Overactive bladder   ? Sleep apnea   ? Sleep apnea   ? ? ?Review of systems:  Otherwise negative.  ? ? ?Physical Exam ? ?Gen: Alert, oriented. Appears stated age.  ?HEENT: [place]/AT. PERRLA. ?Lungs: CTA, no wheezes. ?CV: RR nl S1, S2. ?Abd: soft, benign, no masses. BS+ ?Ext: No edema. Pulses 2+ ? ? ? ?Planned procedures: Proceed with  colonoscopy. The patient understands the nature of the planned procedure, indications, risks, alternatives and potential complications including but not limited to bleeding, infection, perforation, damage to internal organs and possible oversedation/side effects from anesthesia. The patient agrees and gives consent to proceed.  ?Please refer to procedure notes for findings, recommendations and patient disposition/instructions.  ? ? ? ?Raymon Schlarb K. Alice Reichert, M.D. ?Gastroenterology ?12/14/2021  2:07 PM ? ? ? ? ? ? ?

## 2021-12-14 NOTE — Interval H&P Note (Signed)
History and Physical Interval Note: ? ?12/14/2021 ?2:08 PM ? ?Marcia Livingston  has presented today for surgery, with the diagnosis of History of adenomatous colonic pollyps  Z86.010.  The various methods of treatment have been discussed with the patient and family. After consideration of risks, benefits and other options for treatment, the patient has consented to  Procedure(s): ?COLONOSCOPY WITH PROPOFOL (N/A) as a surgical intervention.  The patient's history has been reviewed, patient examined, no change in status, stable for surgery.  I have reviewed the patient's chart and labs.  Questions were answered to the patient's satisfaction.   ? ? ?[place], [place] ? ? ?

## 2021-12-15 ENCOUNTER — Encounter: Payer: Self-pay | Admitting: Internal Medicine

## 2021-12-15 NOTE — Anesthesia Postprocedure Evaluation (Signed)
Anesthesia Post Note ? ?Patient: AUSTINE KELSAY ? ?Procedure(s) Performed: COLONOSCOPY WITH PROPOFOL ? ?Patient location during evaluation: PACU ?Anesthesia Type: General ?Level of consciousness: awake and awake and alert ?Pain management: satisfactory to patient ?Vital Signs Assessment: post-procedure vital signs reviewed and stable ?Respiratory status: spontaneous breathing ?Cardiovascular status: stable ?Anesthetic complications: no ? ? ?No notable events documented. ? ? ?Last Vitals:  ?Vitals:  ? 12/14/21 1432 12/14/21 1442  ?BP: (!) 120/46 (!) 127/58  ?Pulse: 65 64  ?Resp: 15 17  ?Temp: (!) 36.1 ?C   ?SpO2: 96% 100%  ?  ?Last Pain:  ?Vitals:  ? 12/14/21 1442  ?TempSrc:   ?PainSc: 0-No pain  ? ? ?  ?  ?  ?  ?  ?  ? ?VAN STAVEREN,Emelda Kohlbeck ? ? ? ? ?

## 2021-12-16 LAB — SURGICAL PATHOLOGY

## 2022-01-27 ENCOUNTER — Emergency Department: Payer: Medicare Other

## 2022-01-27 ENCOUNTER — Other Ambulatory Visit: Payer: Self-pay

## 2022-01-27 ENCOUNTER — Inpatient Hospital Stay
Admission: EM | Admit: 2022-01-27 | Discharge: 2022-02-03 | DRG: 444 | Disposition: A | Discharge status: Home / Self Care | Payer: Medicare Other | Attending: Internal Medicine | Admitting: Internal Medicine

## 2022-01-27 DIAGNOSIS — T40605A Adverse effect of unspecified narcotics, initial encounter: Secondary | ICD-10-CM | POA: Diagnosis not present

## 2022-01-27 DIAGNOSIS — R7401 Elevation of levels of liver transaminase levels: Secondary | ICD-10-CM

## 2022-01-27 DIAGNOSIS — Z79899 Other long term (current) drug therapy: Secondary | ICD-10-CM

## 2022-01-27 DIAGNOSIS — G4733 Obstructive sleep apnea (adult) (pediatric): Secondary | ICD-10-CM | POA: Diagnosis present

## 2022-01-27 DIAGNOSIS — K8689 Other specified diseases of pancreas: Secondary | ICD-10-CM

## 2022-01-27 DIAGNOSIS — E278 Other specified disorders of adrenal gland: Secondary | ICD-10-CM

## 2022-01-27 DIAGNOSIS — Z823 Family history of stroke: Secondary | ICD-10-CM

## 2022-01-27 DIAGNOSIS — J449 Chronic obstructive pulmonary disease, unspecified: Secondary | ICD-10-CM | POA: Diagnosis present

## 2022-01-27 DIAGNOSIS — F32A Depression, unspecified: Secondary | ICD-10-CM | POA: Diagnosis present

## 2022-01-27 DIAGNOSIS — E669 Obesity, unspecified: Secondary | ICD-10-CM | POA: Diagnosis present

## 2022-01-27 DIAGNOSIS — Z880 Allergy status to penicillin: Secondary | ICD-10-CM

## 2022-01-27 DIAGNOSIS — Z808 Family history of malignant neoplasm of other organs or systems: Secondary | ICD-10-CM

## 2022-01-27 DIAGNOSIS — Y848 Other medical procedures as the cause of abnormal reaction of the patient, or of later complication, without mention of misadventure at the time of the procedure: Secondary | ICD-10-CM | POA: Diagnosis not present

## 2022-01-27 DIAGNOSIS — K831 Obstruction of bile duct: Principal | ICD-10-CM

## 2022-01-27 DIAGNOSIS — F419 Anxiety disorder, unspecified: Secondary | ICD-10-CM | POA: Diagnosis not present

## 2022-01-27 DIAGNOSIS — M1990 Unspecified osteoarthritis, unspecified site: Secondary | ICD-10-CM | POA: Diagnosis present

## 2022-01-27 DIAGNOSIS — R17 Unspecified jaundice: Principal | ICD-10-CM

## 2022-01-27 DIAGNOSIS — E119 Type 2 diabetes mellitus without complications: Secondary | ICD-10-CM | POA: Diagnosis present

## 2022-01-27 DIAGNOSIS — R935 Abnormal findings on diagnostic imaging of other abdominal regions, including retroperitoneum: Secondary | ICD-10-CM

## 2022-01-27 DIAGNOSIS — E7800 Pure hypercholesterolemia, unspecified: Secondary | ICD-10-CM | POA: Diagnosis present

## 2022-01-27 DIAGNOSIS — K760 Fatty (change of) liver, not elsewhere classified: Secondary | ICD-10-CM | POA: Diagnosis present

## 2022-01-27 DIAGNOSIS — Z8042 Family history of malignant neoplasm of prostate: Secondary | ICD-10-CM

## 2022-01-27 DIAGNOSIS — K859 Acute pancreatitis without necrosis or infection, unspecified: Secondary | ICD-10-CM | POA: Diagnosis not present

## 2022-01-27 DIAGNOSIS — Z800 Family history of malignant neoplasm of digestive organs: Secondary | ICD-10-CM

## 2022-01-27 DIAGNOSIS — Z803 Family history of malignant neoplasm of breast: Secondary | ICD-10-CM

## 2022-01-27 DIAGNOSIS — Z882 Allergy status to sulfonamides status: Secondary | ICD-10-CM

## 2022-01-27 DIAGNOSIS — Z7982 Long term (current) use of aspirin: Secondary | ICD-10-CM

## 2022-01-27 DIAGNOSIS — Z8249 Family history of ischemic heart disease and other diseases of the circulatory system: Secondary | ICD-10-CM

## 2022-01-27 DIAGNOSIS — Z885 Allergy status to narcotic agent status: Secondary | ICD-10-CM

## 2022-01-27 DIAGNOSIS — M810 Age-related osteoporosis without current pathological fracture: Secondary | ICD-10-CM | POA: Diagnosis present

## 2022-01-27 DIAGNOSIS — Z825 Family history of asthma and other chronic lower respiratory diseases: Secondary | ICD-10-CM

## 2022-01-27 DIAGNOSIS — Z6837 Body mass index (BMI) 37.0-37.9, adult: Secondary | ICD-10-CM

## 2022-01-27 DIAGNOSIS — K219 Gastro-esophageal reflux disease without esophagitis: Secondary | ICD-10-CM | POA: Diagnosis present

## 2022-01-27 DIAGNOSIS — I350 Nonrheumatic aortic (valve) stenosis: Secondary | ICD-10-CM | POA: Diagnosis present

## 2022-01-27 DIAGNOSIS — E279 Disorder of adrenal gland, unspecified: Secondary | ICD-10-CM

## 2022-01-27 DIAGNOSIS — I10 Essential (primary) hypertension: Secondary | ICD-10-CM | POA: Diagnosis present

## 2022-01-27 DIAGNOSIS — I2510 Atherosclerotic heart disease of native coronary artery without angina pectoris: Secondary | ICD-10-CM | POA: Diagnosis present

## 2022-01-27 DIAGNOSIS — C221 Intrahepatic bile duct carcinoma: Secondary | ICD-10-CM | POA: Diagnosis present

## 2022-01-27 DIAGNOSIS — G928 Other toxic encephalopathy: Secondary | ICD-10-CM | POA: Diagnosis not present

## 2022-01-27 DIAGNOSIS — Z7951 Long term (current) use of inhaled steroids: Secondary | ICD-10-CM

## 2022-01-27 DIAGNOSIS — Z85828 Personal history of other malignant neoplasm of skin: Secondary | ICD-10-CM

## 2022-01-27 DIAGNOSIS — Z87891 Personal history of nicotine dependence: Secondary | ICD-10-CM

## 2022-01-27 DIAGNOSIS — R112 Nausea with vomiting, unspecified: Secondary | ICD-10-CM

## 2022-01-27 DIAGNOSIS — R443 Hallucinations, unspecified: Secondary | ICD-10-CM | POA: Diagnosis not present

## 2022-01-27 LAB — URINALYSIS, ROUTINE W REFLEX MICROSCOPIC
Bilirubin Urine: NEGATIVE
Glucose, UA: NEGATIVE mg/dL
Ketones, ur: NEGATIVE mg/dL
Nitrite: NEGATIVE
Protein, ur: NEGATIVE mg/dL
Specific Gravity, Urine: 1.012 (ref 1.005–1.030)
pH: 5 (ref 5.0–8.0)

## 2022-01-27 LAB — TROPONIN I (HIGH SENSITIVITY)
Troponin I (High Sensitivity): 8 ng/L (ref <18)
Troponin I (High Sensitivity): 9 ng/L (ref <18)

## 2022-01-27 LAB — MAGNESIUM: Magnesium: 2.1 mg/dL (ref 1.7–2.4)

## 2022-01-27 MED ORDER — IOHEXOL 300 MG/ML  SOLN
100.0000 mL | Freq: Once | INTRAMUSCULAR | Status: AC | PRN
  Administered 2022-01-27: 100 mL via INTRAVENOUS

## 2022-01-27 MED ORDER — LORAZEPAM 2 MG/ML IJ SOLN
1.0000 mg | Freq: Once | INTRAMUSCULAR | Status: AC | PRN
  Administered 2022-01-28: 1 mg via INTRAVENOUS
  Filled 2022-01-27: qty 1

## 2022-01-27 MED ORDER — PIPERACILLIN-TAZOBACTAM 3.375 G IVPB 30 MIN
3.3750 g | Freq: Once | INTRAVENOUS | Status: AC
  Administered 2022-01-28: 3.375 g via INTRAVENOUS
  Filled 2022-01-27: qty 50

## 2022-01-27 NOTE — ED Notes (Signed)
Ultrasound tech is at bedside. Will start INT and obtain additional blood specimens when she completes her tests ?

## 2022-01-27 NOTE — ED Notes (Signed)
Provider at bedside to evaluate pt.

## 2022-01-27 NOTE — ED Triage Notes (Signed)
Pt to ED for nausea and weakness since Tuesday. States was seen at PCP today and had labs. Reports liver enzymes were elevated and might recommend [place].  ?Denies pain.  ?States has lost 8 pounds since Tuesday ?Reports dark yellow urine.  ?Labs drawn today, visible in chart.  ?

## 2022-01-27 NOTE — ED Notes (Addendum)
Called [facility] for GI consult ?

## 2022-01-27 NOTE — ED Provider Notes (Signed)
? ?[facility] ?Provider Note ? ? ? Event Date/Time  ? First MD Initiated Contact with Patient 01/27/22 2120   ?  (approximate) ? ? ?History  ? ?Nausea and Weakness ? ? ?HPI ? ?Marcia Livingston is a 76 y.o. female  PMH of HTN, HLD, COPD, DM 2, osteoporosis, OSA, anxiety/depression who presents to the emergency room after being referred from PCPs office where she had some routine labs drawn today for further evaluation of some elevated liver enzymes.  Patient went to the PCP to be evaluated for about 2 weeks of very low appetite and nausea whenever she tries to eat any food as well as some weight loss.  She states she has not any food well and has been able to keep fluids down has not been nauseous in a day or 2.  No abdominal pain, diarrhea, urinary symptoms other than some dark urine.  She denies any chest pain, cough, shortness of breath, back pain, headache and earache or sore throat.  Endorses remote tobacco abuse but no recent tobacco abuse or EtOH use.  Denies any illicit drug use.  Denies any recent Tylenol use.  Denies any other acute symptoms at this time.  States the only liver disease she is aware of as she was told 1, that she may have a fatty liver. ? ?  ? ? ?Physical Exam  ?Triage Vital Signs: ?ED Triage Vitals  ?Enc Vitals Group  ?   BP 01/27/22 1727 (!) 148/58  ?   Pulse Rate 01/27/22 1727 79  ?   Resp 01/27/22 1727 18  ?   Temp 01/27/22 1727 98 ?F (36.7 ?C)  ?   Temp src --   ?   SpO2 01/27/22 1727 97 %  ?   Weight 01/27/22 1730 201 lb (91.2 kg)  ?   Height 01/27/22 1730 5' 1" (1.549 m)  ?   Head Circumference --   ?   Peak Flow --   ?   Pain Score 01/27/22 1730 0  ?   Pain Loc --   ?   Pain Edu? --   ?   Excl. in [place]? --   ? ? ?Most recent vital signs: ?Vitals:  ? 01/27/22 2330 01/28/22 0000  ?BP: (!) 175/69 (!) 146/51  ?Pulse: 72 65  ?Resp:    ?Temp:    ?SpO2: 94% 94%  ? ? ?General: Awake, no distress.  ?CV:  Good peripheral perfusion.  2+ radial pulses. ?Resp:  Normal effort.   Clear bilaterally. ?Abd:  No distention.  Soft. ?Other:  Patient to slightly jaundiced. ? ? ?ED Results / Procedures / Treatments  ?Labs ?(all labs ordered are listed, but only abnormal results are displayed) ?Labs Reviewed  ?URINALYSIS, ROUTINE W REFLEX MICROSCOPIC - Abnormal; Notable for the following components:  ?    Result Value  ? Color, Urine AMBER (*)   ? APPearance CLEAR (*)   ? Hgb urine dipstick SMALL (*)   ? Leukocytes,Ua SMALL (*)   ? Bacteria, UA RARE (*)   ? All other components within normal limits  ?CULTURE, BLOOD (ROUTINE X 2)  ?CULTURE, BLOOD (ROUTINE X 2)  ?MAGNESIUM  ?HEPATITIS PANEL, ACUTE  ?TROPONIN I (HIGH SENSITIVITY)  ?TROPONIN I (HIGH SENSITIVITY)  ? ? ? ?EKG ? ?ECG is remarkable for sinus rhythm with ventricular rate of 78, normal axis, without clear evidence of acute ischemia or significant arrhythmia. ? ? ?RADIOLOGY ?Right upper quadrant ultrasound shows patient is s/p cholecystectomy  with evidence of hepatic steatosis but no other clear acute process occurring.  CBD is measured at 10 mm.  Also reviewed radiologist interpretation. ? ?CT abdomen pelvis on my interpretation shows dilation of the intrahepatic and extrahepatic biliary ducts.  Also reviewed radiology interpretation and agree with the findings of same in addition to notation of a low-density U-shaped structure possibly arising from the pancreatic head representing a pancreatic mass possibly compressing biliary ducts.  There is evidence of some adenopathy retroperitoneally and a 11 mm left adrenal nodule. ? ?PROCEDURES: ? ?Critical Care performed: No ? ?.1-3 Lead EKG Interpretation ?Performed by: Lucrezia Starch, MD ?Authorized by: Lucrezia Starch, MD  ? ?  Interpretation: normal   ?  ECG rate assessment: normal   ?  Rhythm: sinus rhythm   ?  Ectopy: none   ?  Conduction: normal   ? ?The patient is on the cardiac monitor to evaluate for evidence of arrhythmia and/or significant heart rate changes. ? ? ?MEDICATIONS ORDERED IN  ED: ?Medications  ?LORazepam (ATIVAN) injection 1 mg (has no administration in time range)  ?piperacillin-tazobactam (ZOSYN) IVPB 3.375 g (has no administration in time range)  ?iohexol (OMNIPAQUE) 300 MG/ML solution 100 mL (100 mLs Intravenous Contrast Given 01/27/22 2249)  ? ? ? ?IMPRESSION / MDM / ASSESSMENT AND PLAN / ED COURSE  ?I reviewed the triage vital signs and the nursing notes. ?             ?               ? ?Reviewed labs obtained earlier today including hepatic function panel that showed T. bili of 2.9 with a conjugated bilirubin of 1.67, alk phos of 331, AST of 79 and ALT of 121.  This is compared to a CMP from 4/11 that showed an AST of 57, ALT of 85 and alk phos of 282 with bilirubin of 3.1.  The most recent prior to this was from 9/22 that had normal LFTs and bilirubin with alk phos of 110.  BMP earlier today shows a K of 3.5 but no other significant electrolyte or metabolic derangements.  Lipase 19.  CBC without leukocytosis or acute anemia.  INR 1. ? ? ?Differential diagnosis includes, but is not limited to gallstone, malignancy versus other biliary tract obstruction, infectious hepatitis, pancreatitis.  Patient does not have any history of recent EtOH or Tylenol usage. ? ?EKG and nonelevated troponin x2 today are not suggestive of atypical ACS.  Magnesium 2.1.  UA has small leukocyte esterase and some rare bacteria as well as 11-20 WBCs but otherwise unremarkable. ? ?Right upper quadrant ultrasound shows patient is s/p cholecystectomy with evidence of hepatic steatosis but no other clear acute process occurring.  CBD is measured at 10 mm.  Also reviewed radiologist interpretation. ? ?CT abdomen pelvis on my interpretation shows dilation of the intrahepatic and extrahepatic biliary ducts.  Also reviewed radiology interpretation and agree with the findings of same in addition to notation of a low-density U-shaped structure possibly arising from the pancreatic head representing a pancreatic mass  possibly compressing biliary ducts.  There is evidence of some adenopathy retroperitoneally and a 11 mm left adrenal nodule. ? ?Discussed with on-call gastroenterologist Dr. Virgina Jock recommends starting patient on antibiotics and MRCP with plan for likely ASAP sometime early this coming week versus if she decompensates acutely develops cholangeitis she will require transfer. ? ?Care of patient signed over to assuming provider approximately 2300.  Plan is to admit to hospitalist  service.  Orders for antibiotics and MRCP were placed. ? ?Prior to signout I did discuss with patient and daughter over the phone my concerns for possible pancreatic cancer versus other possible malignancy as cause of her symptoms she states she understands this. ? ?  ? ? ?FINAL CLINICAL IMPRESSION(S) / ED DIAGNOSES  ? ?Final diagnoses:  ?Elevated bilirubin  ?Transaminitis  ?Biliary obstruction  ?Adrenal nodule ([place])  ? ? ? ?Rx / DC Orders  ? ?ED Discharge Orders   ? ? None  ? ?  ? ? ? ?Note:  This document was prepared using Dragon voice recognition software and may include unintentional dictation errors. ?  ?Lucrezia Starch, MD ?01/28/22 0018 ? ?

## 2022-01-28 ENCOUNTER — Encounter: Payer: Self-pay | Admitting: Internal Medicine

## 2022-01-28 ENCOUNTER — Inpatient Hospital Stay: Payer: Medicare Other

## 2022-01-28 DIAGNOSIS — Z85828 Personal history of other malignant neoplasm of skin: Secondary | ICD-10-CM | POA: Diagnosis not present

## 2022-01-28 DIAGNOSIS — Z885 Allergy status to narcotic agent status: Secondary | ICD-10-CM | POA: Diagnosis not present

## 2022-01-28 DIAGNOSIS — J449 Chronic obstructive pulmonary disease, unspecified: Secondary | ICD-10-CM | POA: Diagnosis present

## 2022-01-28 DIAGNOSIS — T40605A Adverse effect of unspecified narcotics, initial encounter: Secondary | ICD-10-CM | POA: Diagnosis not present

## 2022-01-28 DIAGNOSIS — F32A Depression, unspecified: Secondary | ICD-10-CM | POA: Diagnosis present

## 2022-01-28 DIAGNOSIS — M1990 Unspecified osteoarthritis, unspecified site: Secondary | ICD-10-CM | POA: Diagnosis present

## 2022-01-28 DIAGNOSIS — M810 Age-related osteoporosis without current pathological fracture: Secondary | ICD-10-CM | POA: Diagnosis present

## 2022-01-28 DIAGNOSIS — I10 Essential (primary) hypertension: Secondary | ICD-10-CM | POA: Diagnosis present

## 2022-01-28 DIAGNOSIS — K859 Acute pancreatitis without necrosis or infection, unspecified: Secondary | ICD-10-CM | POA: Diagnosis not present

## 2022-01-28 DIAGNOSIS — K831 Obstruction of bile duct: Secondary | ICD-10-CM | POA: Diagnosis present

## 2022-01-28 DIAGNOSIS — Y848 Other medical procedures as the cause of abnormal reaction of the patient, or of later complication, without mention of misadventure at the time of the procedure: Secondary | ICD-10-CM | POA: Diagnosis not present

## 2022-01-28 DIAGNOSIS — R935 Abnormal findings on diagnostic imaging of other abdominal regions, including retroperitoneum: Secondary | ICD-10-CM

## 2022-01-28 DIAGNOSIS — I350 Nonrheumatic aortic (valve) stenosis: Secondary | ICD-10-CM | POA: Diagnosis present

## 2022-01-28 DIAGNOSIS — G928 Other toxic encephalopathy: Secondary | ICD-10-CM | POA: Diagnosis not present

## 2022-01-28 DIAGNOSIS — C221 Intrahepatic bile duct carcinoma: Secondary | ICD-10-CM | POA: Diagnosis present

## 2022-01-28 DIAGNOSIS — G4733 Obstructive sleep apnea (adult) (pediatric): Secondary | ICD-10-CM | POA: Diagnosis present

## 2022-01-28 DIAGNOSIS — K8689 Other specified diseases of pancreas: Secondary | ICD-10-CM | POA: Diagnosis not present

## 2022-01-28 DIAGNOSIS — Z882 Allergy status to sulfonamides status: Secondary | ICD-10-CM | POA: Diagnosis not present

## 2022-01-28 DIAGNOSIS — E7800 Pure hypercholesterolemia, unspecified: Secondary | ICD-10-CM | POA: Diagnosis present

## 2022-01-28 DIAGNOSIS — I2510 Atherosclerotic heart disease of native coronary artery without angina pectoris: Secondary | ICD-10-CM | POA: Diagnosis present

## 2022-01-28 DIAGNOSIS — Z880 Allergy status to penicillin: Secondary | ICD-10-CM | POA: Diagnosis not present

## 2022-01-28 DIAGNOSIS — R443 Hallucinations, unspecified: Secondary | ICD-10-CM | POA: Diagnosis not present

## 2022-01-28 DIAGNOSIS — R112 Nausea with vomiting, unspecified: Secondary | ICD-10-CM

## 2022-01-28 DIAGNOSIS — K760 Fatty (change of) liver, not elsewhere classified: Secondary | ICD-10-CM | POA: Diagnosis present

## 2022-01-28 DIAGNOSIS — F419 Anxiety disorder, unspecified: Secondary | ICD-10-CM | POA: Diagnosis not present

## 2022-01-28 DIAGNOSIS — K219 Gastro-esophageal reflux disease without esophagitis: Secondary | ICD-10-CM | POA: Diagnosis present

## 2022-01-28 DIAGNOSIS — E119 Type 2 diabetes mellitus without complications: Secondary | ICD-10-CM | POA: Diagnosis present

## 2022-01-28 DIAGNOSIS — E669 Obesity, unspecified: Secondary | ICD-10-CM | POA: Diagnosis present

## 2022-01-28 LAB — CBC
HCT: 41.4 % (ref 36.0–46.0)
Hemoglobin: 13.4 g/dL (ref 12.0–15.0)
MCH: 29.6 pg (ref 26.0–34.0)
MCHC: 32.4 g/dL (ref 30.0–36.0)
MCV: 91.4 fL (ref 80.0–100.0)
Platelets: 241 10*3/uL (ref 150–400)
RBC: 4.53 MIL/uL (ref 3.87–5.11)
RDW: 13.2 % (ref 11.5–15.5)
WBC: 7.6 10*3/uL (ref 4.0–10.5)
nRBC: 0 % (ref 0.0–0.2)

## 2022-01-28 LAB — BASIC METABOLIC PANEL
Anion gap: 9 (ref 5–15)
BUN: 13 mg/dL (ref 8–23)
CO2: 29 mmol/L (ref 22–32)
Calcium: 9.5 mg/dL (ref 8.9–10.3)
Chloride: 99 mmol/L (ref 98–111)
Creatinine, Ser: 0.84 mg/dL (ref 0.44–1.00)
GFR, Estimated: >60 mL/min (ref >60)
Glucose, Bld: 102 mg/dL — ABNORMAL HIGH (ref 70–99)
Potassium: 3.5 mmol/L (ref 3.5–5.1)
Sodium: 137 mmol/L (ref 135–145)

## 2022-01-28 LAB — HEPATITIS PANEL, ACUTE
HCV Ab: NONREACTIVE
Hep A IgM: NONREACTIVE
Hep B C IgM: NONREACTIVE
Hepatitis B Surface Ag: NONREACTIVE

## 2022-01-28 LAB — HEPATIC FUNCTION PANEL
ALT: 111 U/L — ABNORMAL HIGH (ref 0–44)
AST: 78 U/L — ABNORMAL HIGH (ref 15–41)
Albumin: 3.6 g/dL (ref 3.5–5.0)
Alkaline Phosphatase: 266 U/L — ABNORMAL HIGH (ref 38–126)
Bilirubin, Direct: 2 mg/dL — ABNORMAL HIGH (ref 0.0–0.2)
Indirect Bilirubin: 1.5 mg/dL — ABNORMAL HIGH (ref 0.3–0.9)
Total Bilirubin: 3.5 mg/dL — ABNORMAL HIGH (ref 0.3–1.2)
Total Protein: 7.5 g/dL (ref 6.5–8.1)

## 2022-01-28 MED ORDER — CAMPHOR-MENTHOL 0.5-0.5 % EX LOTN
TOPICAL_LOTION | CUTANEOUS | Status: DC | PRN
  Filled 2022-01-28: qty 222

## 2022-01-28 MED ORDER — ACETAMINOPHEN 325 MG PO TABS
650.0000 mg | ORAL_TABLET | Freq: Four times a day (QID) | ORAL | Status: DC | PRN
  Administered 2022-01-29: 650 mg via ORAL
  Filled 2022-01-28 (×2): qty 2

## 2022-01-28 MED ORDER — ONDANSETRON HCL 4 MG/2ML IJ SOLN
4.0000 mg | Freq: Four times a day (QID) | INTRAMUSCULAR | Status: DC | PRN
  Administered 2022-02-01: 4 mg via INTRAVENOUS
  Filled 2022-01-28: qty 2

## 2022-01-28 MED ORDER — DIPHENHYDRAMINE HCL 25 MG PO CAPS
25.0000 mg | ORAL_CAPSULE | Freq: Four times a day (QID) | ORAL | Status: DC | PRN
  Administered 2022-01-28 – 2022-02-02 (×7): 25 mg via ORAL
  Filled 2022-01-28 (×7): qty 1

## 2022-01-28 MED ORDER — ONDANSETRON HCL 4 MG PO TABS: 4.0000 mg | ORAL_TABLET | Freq: Four times a day (QID) | ORAL | Status: DC | PRN

## 2022-01-28 MED ORDER — ROSUVASTATIN CALCIUM 10 MG PO TABS
5.0000 mg | ORAL_TABLET | ORAL | Status: DC
  Administered 2022-02-01 – 2022-02-03 (×2): 5 mg via ORAL
  Filled 2022-01-28: qty 1

## 2022-01-28 MED ORDER — BUPROPION HCL ER (XL) 150 MG PO TB24
150.0000 mg | ORAL_TABLET | Freq: Every day | ORAL | Status: DC
  Administered 2022-01-29 – 2022-02-03 (×4): 150 mg via ORAL
  Filled 2022-01-28 (×4): qty 1

## 2022-01-28 MED ORDER — LORAZEPAM 2 MG/ML IJ SOLN
0.5000 mg | Freq: Once | INTRAMUSCULAR | Status: DC | PRN
Start: 2022-01-28 — End: 2022-02-03

## 2022-01-28 MED ORDER — GABAPENTIN 300 MG PO CAPS
300.0000 mg | ORAL_CAPSULE | Freq: Three times a day (TID) | ORAL | Status: DC
  Administered 2022-01-28 – 2022-01-29 (×2): 300 mg via ORAL
  Filled 2022-01-28 (×2): qty 1

## 2022-01-28 MED ORDER — TRAZODONE HCL 50 MG PO TABS
50.0000 mg | ORAL_TABLET | Freq: Every evening | ORAL | Status: DC | PRN
  Administered 2022-01-28 – 2022-02-02 (×3): 50 mg via ORAL
  Filled 2022-01-28 (×4): qty 1

## 2022-01-28 MED ORDER — FLUTICASONE FUROATE-VILANTEROL 100-25 MCG/ACT IN AEPB
1.0000 | INHALATION_SPRAY | Freq: Every day | RESPIRATORY_TRACT | Status: DC
  Filled 2022-01-28: qty 28

## 2022-01-28 MED ORDER — DULOXETINE HCL 30 MG PO CPEP
60.0000 mg | ORAL_CAPSULE | Freq: Every day | ORAL | Status: DC
  Administered 2022-01-29 – 2022-02-03 (×4): 60 mg via ORAL
  Filled 2022-01-28 (×4): qty 2

## 2022-01-28 MED ORDER — LOSARTAN POTASSIUM 50 MG PO TABS
50.0000 mg | ORAL_TABLET | Freq: Every day | ORAL | Status: DC
  Administered 2022-01-29 – 2022-02-03 (×5): 50 mg via ORAL
  Filled 2022-01-28 (×5): qty 1

## 2022-01-28 MED ORDER — GADOBUTROL 1 MMOL/ML IV SOLN
9.0000 mL | Freq: Once | INTRAVENOUS | Status: AC | PRN
  Administered 2022-01-28: 9 mL via INTRAVENOUS

## 2022-01-28 MED ORDER — ACETAMINOPHEN 650 MG RE SUPP: 650.0000 mg | Freq: Four times a day (QID) | RECTAL | Status: DC | PRN

## 2022-01-28 MED ORDER — UMECLIDINIUM BROMIDE 62.5 MCG/ACT IN AEPB
1.0000 | INHALATION_SPRAY | Freq: Every day | RESPIRATORY_TRACT | Status: DC
  Filled 2022-01-28: qty 7

## 2022-01-28 MED ORDER — LACTATED RINGERS IV SOLN: INTRAVENOUS | Status: DC

## 2022-01-28 MED ORDER — PIPERACILLIN-TAZOBACTAM 3.375 G IVPB
3.3750 g | Freq: Three times a day (TID) | INTRAVENOUS | Status: AC
  Administered 2022-01-28 – 2022-02-01 (×14): 3.375 g via INTRAVENOUS
  Filled 2022-01-28 (×14): qty 50

## 2022-01-28 NOTE — Progress Notes (Signed)
Patient transferred to  room 228 via stretcher from the ED.  Patient able to get off stretcher and asissted patient to Oriented patient to the room and room equipment. Pt complains of no pain or discomfort and currently says she has no nausea.  Will continue to monitor to end of shift. ?

## 2022-01-28 NOTE — Assessment & Plan Note (Addendum)
Continue losartan and hydrochlorothiazide ?

## 2022-01-28 NOTE — ED Notes (Signed)
Patient resting comfortably on stretcher in room. RR even and unlabored. Patient verbalizes no needs or complaints at this time. Patient updated on plan of care. Blood cultures x 2 drawn and sent to lab prior to initiation of antibiotic. Updating vital signs now for floor admisssion. ?

## 2022-01-28 NOTE — Consult Note (Addendum)
? ? ?GI Inpatient Consult Note ? ?Reason for Consult: Obstructive jaundice, elevated LFTs ?  ?Attending Requesting Consult: Dr. Tina Lai ? ?History of Present Illness: ?Marcia Livingston is a 76 y.o. female seen for evaluation of obstructive jaundice at the request of hospitalist - Dr. Tina Lai. Patient has a PMH of HTN, HLD, COPD, OSA on CPAP, T2DM, anxiety, depression, and osteoporosis. She presented to the [facility] ED yesterday evening after seeing Rob Tumey, PA-C at the [facility] yesterday with labs showing worsening LFTs. She reports over the past two weeks she has been having significant GI distress with no appetite, persistent and pervasive nausea with non-bloody and non-bilious emesis, and weight loss of 10-lbs. She also noticed tea colored urine and white, chalky stools. She was seen by her primary care doctor, Dr. Johnston, on 4/11 this week for these symptoms and was given IV fluid bolus and labs drawn which showed elevated LFTs with AST 57, ALT 85, alk phos 282, and total bilirubin 3.1. She went for re-check yesterday which showed worsening LFTs with AST 79, ALT 121, alk phos 331, and total bilirubin 2.9 (direct component 1.67). She was advised to come to the ED for further evaluation. Upon presentation to the ED, she was mildly hypertensive with BP 148/58 and otherwise normal vital signs. CBC showed no evidence of leukocytosis, anemia, or thrombocytopenia. EKG showed no evidence of ischemia. Acute hepatitis panel pending. RUQ US showed hepatic steatosis and prior cholecystectomy with no intrahepatic dilatation. CT abd/pelvis with contrast commented on left > right intrahepatic biliary dilatation with dilated CBD, with low-density U-shaped structure that may arise from pancreatic head as well as upper retroperitoneal adenopathy. 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 There was also note of extensive pathologic hilar and periportal lymphadenopathy with obstruction of mid common duct 2/2 extrinsic compression. She was admitted to the hospital and started on IV Zosyn. GI consulted for further evaluation and management.  ? ?Patient seen and examined this morning resting comfortably in hospital bed. No acute events overnight. She denies any fevers, chills, night sweats, or altered mental status. She is not in any significant abdominal discomfort or pain. She was actually seen by myself in outpatient setting back before Christmas to schedule a survellance colonoscopy 2/2 personal hx of adenomatous colon polyps. She had colonoscopy performed by Dr. Toledo about six weeks ago which showed sigmoid diverticulosis and one subcentimeter tubular adenoma removed from distal transverse colon. She reports BMs the past week have been brown in color without any frank hematochezia or melena. Last week she did report some white-colored and chalky stools. She takes omeprazole 20 mg daily for GERD prevention and these symptoms have been stable. She denies any esophageal dysphagia, odynophagia, or atypical chest pain symptoms. No previous endoscopy.  ? ? ?Past Medical History:  ?Past Medical History:  ?Diagnosis Date  ? Anxiety   ? Arthritis   ? CAD (coronary artery disease)   ? Cancer (HCC)   ? SKIN  ? Colon polyps   ? COPD (chronic obstructive pulmonary disease) (HCC)   ? Depression   ? Depression   ? Fibrocystic breast disease   ? GERD (gastroesophageal reflux disease)   ? Heart murmur   ? High cholesterol   ? Hyperlipidemia   ? Hypertension   ? IBS (irritable bowel syndrome)   ?   IBS (irritable bowel syndrome)   ? Obesity   ? Osteoporosis   ? Overactive bladder   ? Sleep apnea   ? Sleep apnea   ?  ?Problem List: ?Patient Active Problem List  ? Diagnosis Date Noted  ? Abnormal CT of pancreatic head, suspicious for  malignancy 01/28/2022  ? Nausea and vomiting 01/28/2022  ? COPD (chronic obstructive pulmonary disease) ([place])   ? Skin cancer 01/30/2020  ? Morbid obesity ([place]) 08/15/2019  ? Prediabetes 02/14/2019  ? Allergic rhinitis 07/04/2018  ? OSA (obstructive sleep apnea) 01/16/2018  ? GERD (gastroesophageal reflux disease) 01/16/2018  ? Insomnia 01/16/2018  ? Mild aortic stenosis 01/16/2018  ? Angina pectoris ([place]) 10/22/2015  ? HTN (hypertension) 10/22/2015  ? High cholesterol 10/22/2015  ? Anxiety 10/22/2015  ? Depression 10/22/2015  ? CAD (coronary artery disease) 10/22/2015  ? H/O cardiac catheterization 06/02/2014  ?  ?Past Surgical History: ?Past Surgical History:  ?Procedure Laterality Date  ? ABDOMINAL HYSTERECTOMY    ? total  ? APPENDECTOMY    ? BREAST EXCISIONAL BIOPSY Right YRS AGO  ? NEG  ? CATARACT EXTRACTION W/PHACO Right 02/01/2016  ? Procedure: CATARACT EXTRACTION PHACO AND INTRAOCULAR LENS PLACEMENT (IOC);  Surgeon: Birder Robson, MD;  Location: [facility] ORS;  Service: Ophthalmology;  Laterality: Right;  [place]    00:37.2 ?AP%  20.7% ?CDE  7.69 ?fluid pack lot # [id number]  ? CATARACT EXTRACTION W/PHACO Left 02/22/2016  ? Procedure: CATARACT EXTRACTION PHACO AND INTRAOCULAR LENS PLACEMENT (IOC);  Surgeon: Birder Robson, MD;  Location: [facility] ORS;  Service: Ophthalmology;  Laterality: Left;  [place] 00:40 ?AP% 19.9 ?CDE 8.06 ?fluid pack lot # [id number]  ? CHOLECYSTECTOMY    ? COLONOSCOPY WITH PROPOFOL N/A 01/13/2016  ? Procedure: COLONOSCOPY WITH PROPOFOL;  Surgeon: Manya Silvas, MD;  Location: [facility] ENDOSCOPY;  Service: Endoscopy;  Laterality: N/A;  ? COLONOSCOPY WITH PROPOFOL N/A 12/14/2021  ? Procedure: COLONOSCOPY WITH PROPOFOL;  Surgeon: Toledo, Benay Pike, MD;  Location: [facility] ENDOSCOPY;  Service: Gastroenterology;  Laterality: N/A;  ? FOOT SURGERY Left   ? for breakdown of arch  ? OVARIAN CYST SURGERY    ? 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 Right [place]  ?  ?Allergies: ?Allergies  ?Allergen Reactions  ? Penicillins  Rash  ?  Has patient had a PCN reaction causing immediate rash, facial/tongue/throat swelling, SOB or lightheadedness with hypotension: [id number]} ?Has patient had a PCN reaction causing severe rash involving mucus membranes or skin necrosis: no:[id number]} ?Has patient had a PCN reaction that required hospitalization no:[id number]} ?Has patient had a PCN reaction occurring within the last 10 years: ON:[id number]} ?If all of the above answers are "NO", then may proceed with Cephalosporin use. ?  ? Codeine Hives  ? Sulfa Antibiotics Hives  ?  ?Home Medications: ?Medications Prior to Admission  ?Medication Sig Dispense Refill Last Dose  ? aspirin EC 81 MG tablet Take 81 mg by mouth daily.   01/27/2022  ? buPROPion (WELLBUTRIN XL) 150 MG 24 hr tablet TAKE 1 TABLET BY MOUTH  DAILY 90 tablet 1 01/27/2022  ? busPIRone (BUSPAR) 15 MG tablet Take 15 mg by mouth 3 (three) times daily.   01/27/2022  ? calcium carbonate (TUMS - DOSED IN MG ELEMENTAL CALCIUM) 500 MG chewable tablet Chew 1 tablet by mouth 3 (three) times daily with meals.   Past Week  ? cholecalciferol (VITAMIN D) 1000 units tablet Take 1,000 Units by mouth daily.   01/27/2022  ? colestipol (COLESTID) 1 g tablet TAKE 1  TABLET BY MOUTH  DAILY 90 tablet 0 Past Week  ? DULoxetine (CYMBALTA) 60 MG capsule TAKE 1 CAPSULE BY MOUTH  DAILY 90 capsule 1 01/27/2022  ? Fluticasone-Umeclidin-Vilant (TRELEGY ELLIPTA) 100-62.5-25 MCG/INH AEPB Inhale 1 puff into the lungs daily.    Past Month  ? gabapentin (NEURONTIN) 300 MG capsule Take 300 mg by mouth 3 (three) times daily.   01/27/2022  ? hydrochlorothiazide (HYDRODIURIL) 25 MG tablet TAKE 1 TABLET BY MOUTH  DAILY 90 tablet 3 01/27/2022  ? losartan (COZAAR) 50 MG tablet TAKE 1 TABLET BY MOUTH  DAILY 90 tablet 3 01/27/2022  ? ondansetron (ZOFRAN-ODT) 4 MG disintegrating tablet Take 4 mg by mouth every 8 (eight) hours as needed.   01/27/2022  ? traZODone (DESYREL) 50 MG tablet Take 1-2 tablets at bedtime for sleep. 180 tablet 1 01/26/2022   ? vitamin B-12 (CYANOCOBALAMIN) 500 MCG tablet Take 500 mcg by mouth daily.   01/27/2022  ? doxycycline (VIBRA-TABS) 100 MG tablet Take 1 tablet (100 mg total) by mouth 2 (two) times daily. (Patient not taking: Repor

## 2022-01-28 NOTE — Assessment & Plan Note (Signed)
Continue Wellbutrin and Cymbalta and trazodone ?

## 2022-01-28 NOTE — ED Notes (Signed)
Patient medicated for her claustrophobia and then taken to MRI. Patient will be sent to the floor after MRI completed. ?

## 2022-01-28 NOTE — Assessment & Plan Note (Signed)
Follow-up MRCP ?GI consult for further recommendations ?Keep n.p.o. tonight for possible ERCP in the a.m. ?

## 2022-01-28 NOTE — Plan of Care (Signed)

## 2022-01-28 NOTE — Assessment & Plan Note (Addendum)
Secondary to suspected malignancy with obstructive jaundice ?--improved, able to tolerate oral hydration ?

## 2022-01-28 NOTE — Assessment & Plan Note (Signed)
Not acutely exacerbated ?Continue Trelegy Ellipta with DuoNebs as needed ?

## 2022-01-28 NOTE — Assessment & Plan Note (Addendum)
No complaints of chest pain ?Continue rosuvastatin ?

## 2022-01-28 NOTE — H&P (Signed)
?History and Physical  ? ? ?Patient: Marcia Livingston:[id number] DOB: Dec 30, 1945 ?DOA: 01/27/2022 ?DOS: the patient was seen and examined on 01/28/2022 ?PCP: Baxter Hire, MD  ?Patient coming from: Home ? ?Chief Complaint:  ?Chief Complaint  ?Patient presents with  ? Nausea  ? Weakness  ? ? ?HPI: Marcia Livingston is a 76 y.o. female with medical history significant for OSA on CPAP, IBS, DM, COPD, anxiety and depression, colon polyps with recent colonoscopy on 3/1, who was sent by her PCP for evaluation of elevated LFTs.  Patient was seen by her PCP on 4/11 with a 1 week complaint of nausea and vomiting with decreased oral intake, generalized weakness.  On her follow-up on 4/14, she had ongoing symptoms and blood work revealed elevated bilirubin of 3.1.  During this time patient developed an orange color to her urine and she was noted to be jaundiced and was sent in for evaluation. ?ED course and data review: On arrival BP 148/58 with otherwise normal vitals.  Blood work from PCP reviewed and shows AST/ALT of 79/121 with alk phos of 331, bilirubin 2.9.  CBC was within normal limits.  Urine was amber in color.  Magnesium 2.1 and troponin 9.  Acute hepatitis panel pending.  EKG, personally viewed and interpreted: NSR at 78 with nonspecific ST-T wave changes CT abdomen and pelvis showed the following findings ?IMPRESSION: ?1. Left greater than right intrahepatic biliary ductal dilatation ?with dilated common bile duct. ?2. Source of obstruction appears to be a low-density U-shaped ?structure that may rise from the pancreatic head, possible ?pancreatic mass. While the next step for assessment would be MRCP, ?this should only be considered if this patient is able to tolerate ?breath hold technique. ERCP with endoscopic ultrasound alternatively ?could be considered. Alternatively this may represent a ring of ?adenopathy. ?3. Upper retroperitoneal adenopathy. ?4. Nonspecific 11 mm left adrenal nodule ? ?Right upper quadrant  ultrasound showed increased liver echotexture consistent with hepatic steatosis.  Cholecystectomy status. ?The ED provider spoke with gastroenterologist, Dr. Virgina Jock who recommended MRCP and advised to start Zosyn and admitted this facility.  They did consult with GI over at: [facility] however transfer was not found to be necessary.  Hospitalist consulted for admission.  ? ?Review of Systems: As mentioned in the history of present illness. All other systems reviewed and are negative. ?Past Medical History:  ?Diagnosis Date  ? Anxiety   ? Arthritis   ? CAD (coronary artery disease)   ? Cancer [facility])   ? SKIN  ? Colon polyps   ? COPD (chronic obstructive pulmonary disease) ([place])   ? Depression   ? Depression   ? Fibrocystic breast disease   ? GERD (gastroesophageal reflux disease)   ? Heart murmur   ? High cholesterol   ? Hyperlipidemia   ? Hypertension   ? IBS (irritable bowel syndrome)   ? IBS (irritable bowel syndrome)   ? Obesity   ? Osteoporosis   ? Overactive bladder   ? Sleep apnea   ? Sleep apnea   ? ?Past Surgical History:  ?Procedure Laterality Date  ? ABDOMINAL HYSTERECTOMY    ? total  ? APPENDECTOMY    ? BREAST EXCISIONAL BIOPSY Right YRS AGO  ? NEG  ? CATARACT EXTRACTION W/PHACO Right 02/01/2016  ? Procedure: CATARACT EXTRACTION PHACO AND INTRAOCULAR LENS PLACEMENT (IOC);  Surgeon: Birder Robson, MD;  Location: [facility] ORS;  Service: Ophthalmology;  Laterality: Right;  [place]    00:37.2 ?AP%  20.7% ?CDE  7.69 ?fluid pack lot # [id number]  ? CATARACT EXTRACTION W/PHACO Left 02/22/2016  ? Procedure: CATARACT EXTRACTION PHACO AND INTRAOCULAR LENS PLACEMENT (IOC);  Surgeon: Birder Robson, MD;  Location: [facility] ORS;  Service: Ophthalmology;  Laterality: Left;  [place] 00:40 ?AP% 19.9 ?CDE 8.06 ?fluid pack lot # [id number]  ? CHOLECYSTECTOMY    ? COLONOSCOPY WITH PROPOFOL N/A 01/13/2016  ? Procedure: COLONOSCOPY WITH PROPOFOL;  Surgeon: Manya Silvas, MD;  Location: [facility] ENDOSCOPY;  Service: Endoscopy;  Laterality: N/A;  ?  COLONOSCOPY WITH PROPOFOL N/A 12/14/2021  ? Procedure: COLONOSCOPY WITH PROPOFOL;  Surgeon: Toledo, Benay Pike, MD;  Location: [facility] ENDOSCOPY;  Service: Gastroenterology;  Laterality: N/A;  ? FOOT SURGERY Left   ? for breakdown of arch  ? OVARIAN CYST SURGERY    ? 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 Right [place]  ? ?Social History:  reports that she quit smoking about 25 years ago. Her smoking use included cigarettes. She has a 30.00 pack-year smoking history. She has never used smokeless tobacco. She reports current alcohol use. She reports that she does not use drugs. ? ?Allergies  ?Allergen Reactions  ? Penicillins Rash  ?  Has patient had a PCN reaction causing immediate rash, facial/tongue/throat swelling, SOB or lightheadedness with hypotension: [id number]} ?Has patient had a PCN reaction causing severe rash involving mucus membranes or skin necrosis: no:[id number]} ?Has patient had a PCN reaction that required hospitalization no:[id number]} ?Has patient had a PCN reaction occurring within the last 10 years: [id number]} ?If all of the above answers are "NO", then may proceed with Cephalosporin use. ?  ? Codeine Hives  ? Sulfa Antibiotics Hives  ? ? ?Family History  ?Problem Relation Age of Onset  ? Colon cancer Mother   ?     thought to be mets from breast cancer  ? Stroke Mother   ? Hypertension Mother   ? Heart attack Mother   ? Breast cancer Mother 81  ? Emphysema Father   ? Prostate cancer Father   ? Throat cancer Daughter   ? Breast cancer Maternal Aunt   ?     81's  ? Rheum arthritis Daughter   ? Hypertension Daughter   ? Reye's syndrome Daughter   ?     history of  ? Hepatitis C Daughter   ? Ovarian cancer Neg Hx   ? ? ?Prior to Admission medications   ?Medication Sig Start Date End Date Taking? Authorizing Provider  ?aspirin EC 81 MG tablet Take 81 mg by mouth daily.    [provider]  ?buPROPion (WELLBUTRIN XL) 150 MG 24 hr tablet TAKE 1 TABLET BY MOUTH  DAILY 06/24/20   Bacigalupo, Dionne Bucy,  MD  ?busPIRone (BUSPAR) 15 MG tablet Take 15 mg by mouth 3 (three) times daily.    [provider]  ?calcium carbonate (TUMS - DOSED IN MG ELEMENTAL CALCIUM) 500 MG chewable tablet Chew 1 tablet by mouth 3 (three) times daily with meals.    [provider]  ?cholecalciferol (VITAMIN D) 1000 units tablet Take 1,000 Units by mouth daily.    [provider]  ?colestipol (COLESTID) 1 g tablet TAKE 1 TABLET BY MOUTH  DAILY 11/07/20   Virginia Crews, MD  ?doxycycline (VIBRA-TABS) 100 MG tablet Take 1 tablet (100 mg total) by mouth 2 (two) times daily. 10/10/20   Kennyth Arnold, FNP  ?DULoxetine (CYMBALTA) 60 MG capsule TAKE 1 CAPSULE BY MOUTH  DAILY 06/02/20   Bacigalupo, Dionne Bucy, MD  ?  Fluticasone-Umeclidin-Vilant (TRELEGY ELLIPTA) 100-62.5-25 MCG/INH AEPB Inhale 1 puff into the lungs daily.  08/05/19   [provider]  ?gabapentin (NEURONTIN) 300 MG capsule Take 300 mg by mouth 3 (three) times daily.    [provider]  ?hydrochlorothiazide (HYDRODIURIL) 25 MG tablet TAKE 1 TABLET BY MOUTH  DAILY 01/30/20   Bacigalupo, Dionne Bucy, MD  ?losartan (COZAAR) 50 MG tablet TAKE 1 TABLET BY MOUTH  DAILY 01/30/20   Virginia Crews, MD  ?meclizine (ANTIVERT) 25 MG tablet Take 25 mg by mouth 3 (three) times daily as needed for dizziness.    [provider]  ?mirabegron ER (MYRBETRIQ) 50 MG TB24 tablet Take 1 tablet (50 mg total) by mouth daily. 01/06/21   Stoioff, Ronda Fairly, MD  ?rosuvastatin (CRESTOR) 5 MG tablet Take 5 mg by mouth 3 (three) times a week.    [provider]  ?traZODone (DESYREL) 50 MG tablet Take 1-2 tablets at bedtime for sleep. 02/13/20   Virginia Crews, MD  ? ? ?Physical Exam: ?Vitals:  ? 01/27/22 2000 01/27/22 2200 01/27/22 2330 01/28/22 0000  ?BP: (!) 158/62 (!) 160/65 (!) 175/69 (!) 146/51  ?Pulse: 75 76 72 65  ?Resp: 16 16    ?Temp:      ?SpO2: 95% 95% 94% 94%  ?Weight:      ?Height:      ? ?Physical Exam ?Vitals and nursing note  reviewed.  ?Constitutional:   ?   General: She is not in acute distress. ?HENT:  ?   Head: Normocephalic and atraumatic.  ?Cardiovascular:  ?   Rate and Rhythm: Normal rate and regular rhythm.  ?   Heart sounds: No

## 2022-01-28 NOTE — Assessment & Plan Note (Signed)
CPAP nightly if desired °

## 2022-01-29 ENCOUNTER — Inpatient Hospital Stay: Payer: Medicare Other

## 2022-01-29 DIAGNOSIS — C221 Intrahepatic bile duct carcinoma: Secondary | ICD-10-CM | POA: Diagnosis not present

## 2022-01-29 DIAGNOSIS — K831 Obstruction of bile duct: Secondary | ICD-10-CM

## 2022-01-29 LAB — COMPREHENSIVE METABOLIC PANEL
ALT: 119 U/L — ABNORMAL HIGH (ref 0–44)
AST: 87 U/L — ABNORMAL HIGH (ref 15–41)
Albumin: 3.3 g/dL — ABNORMAL LOW (ref 3.5–5.0)
Alkaline Phosphatase: 255 U/L — ABNORMAL HIGH (ref 38–126)
Anion gap: 9 (ref 5–15)
BUN: 12 mg/dL (ref 8–23)
CO2: 28 mmol/L (ref 22–32)
Calcium: 8.9 mg/dL (ref 8.9–10.3)
Chloride: 104 mmol/L (ref 98–111)
Creatinine, Ser: 0.87 mg/dL (ref 0.44–1.00)
GFR, Estimated: >60 mL/min (ref >60)
Glucose, Bld: 119 mg/dL — ABNORMAL HIGH (ref 70–99)
Potassium: 3.3 mmol/L — ABNORMAL LOW (ref 3.5–5.1)
Sodium: 141 mmol/L (ref 135–145)
Total Bilirubin: 3.3 mg/dL — ABNORMAL HIGH (ref 0.3–1.2)
Total Protein: 7.2 g/dL (ref 6.5–8.1)

## 2022-01-29 LAB — CBC
HCT: 40.1 % (ref 36.0–46.0)
Hemoglobin: 13 g/dL (ref 12.0–15.0)
MCH: 30.1 pg (ref 26.0–34.0)
MCHC: 32.4 g/dL (ref 30.0–36.0)
MCV: 92.8 fL (ref 80.0–100.0)
Platelets: 229 10*3/uL (ref 150–400)
RBC: 4.32 MIL/uL (ref 3.87–5.11)
RDW: 13.2 % (ref 11.5–15.5)
WBC: 7.2 10*3/uL (ref 4.0–10.5)
nRBC: 0 % (ref 0.0–0.2)

## 2022-01-29 LAB — URINE CULTURE: Culture: NO GROWTH

## 2022-01-29 LAB — MAGNESIUM: Magnesium: 2.1 mg/dL (ref 1.7–2.4)

## 2022-01-29 MED ORDER — ADULT MULTIVITAMIN W/MINERALS CH
1.0000 | ORAL_TABLET | Freq: Every day | ORAL | Status: DC
  Administered 2022-01-29 – 2022-02-03 (×4): 1 via ORAL
  Filled 2022-01-29 (×4): qty 1

## 2022-01-29 MED ORDER — ENSURE ENLIVE PO LIQD: 237.0000 mL | Freq: Two times a day (BID) | ORAL | Status: DC

## 2022-01-29 MED ORDER — GABAPENTIN 300 MG PO CAPS
300.0000 mg | ORAL_CAPSULE | Freq: Every day | ORAL | Status: DC
  Administered 2022-01-29 – 2022-01-31 (×3): 300 mg via ORAL
  Filled 2022-01-29 (×3): qty 1

## 2022-01-29 MED ORDER — POTASSIUM CHLORIDE CRYS ER 20 MEQ PO TBCR
40.0000 meq | EXTENDED_RELEASE_TABLET | Freq: Once | ORAL | Status: AC
  Administered 2022-01-29: 40 meq via ORAL
  Filled 2022-01-29: qty 2

## 2022-01-29 MED ORDER — ALPRAZOLAM 0.5 MG PO TABS
0.5000 mg | ORAL_TABLET | Freq: Two times a day (BID) | ORAL | Status: DC | PRN
  Administered 2022-01-29 – 2022-02-03 (×6): 0.5 mg via ORAL
  Filled 2022-01-29 (×6): qty 1

## 2022-01-29 NOTE — Consult Note (Signed)
? ?Hematology/Oncology Consult note ?[facility] ?[phone] [phone] ? ?Patient Care Team: ?Baxter Hire, MD as PCP - General (Internal Medicine) ?Birder Robson, MD as Referring Physician (Ophthalmology) ?Isaias Cowman, MD as Consulting Physician (Cardiology) ?Ottie Glazier, MD as Consulting Physician (Pulmonary Disease)  ? ?Name of the patient: Marcia Livingston  ?[id number]  ?[birth year]  ? ? 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  ?Requesting physician: Dr. Enzo Bi ? ?Date of visit:01/29/2022 ? ? ? ?History of presenting illness- Patient is a 76 year old female with a past medical history significant for obstructive sleep apnea, COPD anxiety depression type 2 diabetes who was seen by PCP for abnormal LFTs along with symptoms of nausea vomiting and generalized weakness and decreased oral intake.  Routine blood work 01/27/2022 showed an elevated bilirubin of 3.3 and patient was noted to be jaundiced and therefore sent to the ER.  On admission total bilirubin elevated at 3.5 with a direct bilirubin of 2.  AST ALT elevated at 78 and 111 respectively with an elevated alkaline phosphatase of 266.   ? ?CT abdomen and pelvis with contrast as well as MRI with MRCP showed limited examination due to motion artifact.  Enhancing infiltrative mass in the central left hepatic lobe which abuts the confluence of hepatic ducts measuring 3.5 x 4.5 cm.  Extensive associated pathologic hilar and periportal adenopathy and subsequent obstruction of the mid common duct secondary to extrinsic compression.  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  Pathologic retroperitoneal adenopathy with aortocaval and left periaortic lymph nodes. ? ?GI is on board with plans for ERCP tomorrow.  Tumor markers including CA 19-9 and CEA andAFP are currently pending. ? ?ECOG PS- 1 ? ?Pain scale- 0 ? ? ?Review of systems- Review of Systems  ?Constitutional:   Positive for malaise/fatigue and weight loss. Negative for chills and fever.  ?HENT:  Negative for congestion, ear discharge and nosebleeds.   ?Eyes:  Negative for blurred vision.  ?Respiratory:  Negative for cough, hemoptysis, sputum production, shortness of breath and wheezing.   ?Cardiovascular:  Negative for chest pain, palpitations, orthopnea and claudication.  ?Gastrointestinal:  Negative for abdominal pain, blood in stool, constipation, diarrhea, heartburn, melena, nausea and vomiting.  ?Genitourinary:  Negative for dysuria, flank pain, frequency, hematuria and urgency.  ?Musculoskeletal:  Negative for back pain, joint pain and myalgias.  ?Skin:  Negative for rash.  ?Neurological:  Negative for dizziness, tingling, focal weakness, seizures, weakness and headaches.  ?Endo/Heme/Allergies:  Does not bruise/bleed easily.  ?Psychiatric/Behavioral:  Negative for depression and suicidal ideas. The patient does not have insomnia.   ? ?Allergies  ?Allergen Reactions  ? Penicillins Rash  ?  Has patient had a PCN reaction causing immediate rash, facial/tongue/throat swelling, SOB or lightheadedness with hypotension: [id number]} ?Has patient had a PCN reaction causing severe rash involving mucus membranes or skin necrosis: no:[id number]} ?Has patient had a PCN reaction that required hospitalization no:[id number]} ?Has patient had a PCN reaction occurring within the last 10 years: [id number]} ?If all of the above answers are "NO", then may proceed with Cephalosporin use. ?  ? Codeine Hives  ? Sulfa Antibiotics Hives  ? ? ?Patient Active Problem List  ? Diagnosis Date Noted  ? Abnormal CT of pancreatic head, suspicious for malignancy 01/28/2022  ? Nausea and vomiting 01/28/2022  ? COPD (chronic obstructive pulmonary disease) ([place])   ? Skin cancer 01/30/2020  ? Morbid obesity ([place]) 08/15/2019  ? Prediabetes 02/14/2019  ? Allergic rhinitis 07/04/2018  ?  OSA (obstructive sleep apnea) 01/16/2018  ? GERD (gastroesophageal reflux  disease) 01/16/2018  ? Insomnia 01/16/2018  ? Mild aortic stenosis 01/16/2018  ? Angina pectoris ([place]) 10/22/2015  ? HTN (hypertension) 10/22/2015  ? High cholesterol 10/22/2015  ? Anxiety 10/22/2015  ? Depression 10/22/2015  ? CAD (coronary artery disease) 10/22/2015  ? H/O cardiac catheterization 06/02/2014  ? ? ? ?Past Medical History:  ?Diagnosis Date  ? Anxiety   ? Arthritis   ? CAD (coronary artery disease)   ? Cancer [facility])   ? SKIN  ? Colon polyps   ? COPD (chronic obstructive pulmonary disease) ([place])   ? Depression   ? Depression   ? Fibrocystic breast disease   ? GERD (gastroesophageal reflux disease)   ? Heart murmur   ? High cholesterol   ? Hyperlipidemia   ? Hypertension   ? IBS (irritable bowel syndrome)   ? IBS (irritable bowel syndrome)   ? Obesity   ? Osteoporosis   ? Overactive bladder   ? Sleep apnea   ? Sleep apnea   ? ? ? ?Past Surgical History:  ?Procedure Laterality Date  ? ABDOMINAL HYSTERECTOMY    ? total  ? APPENDECTOMY    ? BREAST EXCISIONAL BIOPSY Right YRS AGO  ? NEG  ? CATARACT EXTRACTION W/PHACO Right 02/01/2016  ? Procedure: CATARACT EXTRACTION PHACO AND INTRAOCULAR LENS PLACEMENT (IOC);  Surgeon: Birder Robson, MD;  Location: [facility] ORS;  Service: Ophthalmology;  Laterality: Right;  [place]    00:37.2 ?AP%  20.7% ?CDE  7.69 ?fluid pack lot # [id number]  ? CATARACT EXTRACTION W/PHACO Left 02/22/2016  ? Procedure: CATARACT EXTRACTION PHACO AND INTRAOCULAR LENS PLACEMENT (IOC);  Surgeon: Birder Robson, MD;  Location: [facility] ORS;  Service: Ophthalmology;  Laterality: Left;  [place] 00:40 ?AP% 19.9 ?CDE 8.06 ?fluid pack lot # [id number]  ? CHOLECYSTECTOMY    ? COLONOSCOPY WITH PROPOFOL N/A 01/13/2016  ? Procedure: COLONOSCOPY WITH PROPOFOL;  Surgeon: Manya Silvas, MD;  Location: [facility] ENDOSCOPY;  Service: Endoscopy;  Laterality: N/A;  ? COLONOSCOPY WITH PROPOFOL N/A 12/14/2021  ? Procedure: COLONOSCOPY WITH PROPOFOL;  Surgeon: Toledo, Benay Pike, MD;  Location: [facility] ENDOSCOPY;  Service: Gastroenterology;   Laterality: N/A;  ? FOOT SURGERY Left   ? for breakdown of arch  ? OVARIAN CYST SURGERY    ? 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 Right Vernard Gambles  ? ? ?Social History  ? ?Socioeconomic History  ? Marital status: Married  ?  Spouse name: Orpah Cobb. [place]  ? Number of children: 2  ? Years of education: 23  ? Highest education level: High school graduate  ?Occupational History  ? 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 Smoking status: Former  ?  Packs/day: 1.00  ?  Years: 30.00  ?  Pack years: 30.00  ?  Types: Cigarettes  ?  Quit date: 10/15/1996  ?  Years since quitting: 25.3  ? Smokeless tobacco: Never  ?Vaping Use  ? Vaping Use: Never used  ?Substance and Sexual Activity  ? Alcohol use: Yes  ?  Comment: wine rarely  ? Drug use: No  ? Sexual activity: Not Currently  ?  Partners: Male  ?  Birth control/protection: Surgical  ?Other Topics Concern  ? Not on file  ?Social History Narrative  ? Pt states she has raised her granddaughter, and considers the granddaughter her child.  ? ?Social Determinants of Health  ? ?Financial Resource Strain: Not on file  ?Food  Insecurity: Not on file  ?Transportation Needs: Not on file  ?Physical Activity: Not on file  ?Stress: Not on file  ?Social Connections: Not on file  ?Intimate Partner Violence: Not on file  ? ?  ?Family History  ?Problem Relation Age of Onset  ? Colon cancer Mother   ?     thought to be mets from breast cancer  ? Stroke Mother   ? Hypertension Mother   ? Heart attack Mother   ? Breast cancer Mother 43  ? Emphysema Father   ? Prostate cancer Father   ? Throat cancer Daughter   ? Breast cancer Maternal Aunt   ?     12's  ? Rheum arthritis Daughter   ? Hypertension Daughter   ? Reye's syndrome Daughter   ?     history of  ? Hepatitis C Daughter   ? Ovarian cancer Neg Hx   ? ? ? ?Current Facility-Administered Medications:  ?  acetaminophen (TYLENOL) tablet 650 mg, 650 mg, Oral, Q6H PRN **OR** acetaminophen (TYLENOL) suppository 650 mg, 650 mg, Rectal,  Q6H PRN, Athena Masse, MD ?  buPROPion (WELLBUTRIN XL) 24 hr tablet 150 mg, 150 mg, Oral, Daily, Sharion Settler, NP, 150 mg at 01/29/22 8341 ?  camphor-menthol (SARNA) lotion, , Topical, PRN, Mor

## 2022-01-29 NOTE — Assessment & Plan Note (Addendum)
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

## 2022-01-29 NOTE — Assessment & Plan Note (Addendum)
--  ECRP today.  A biliary sphincterotomy was performed and One covered metal stent was placed into the common bile duct. ?-Continue IV zosyn for now ?--oxycodone 10 mg q4h PRN for pain ?--monitor for pancreatitis, bleeding, perforation, and cholangitis. ?--clear liquid diet now, advance as tolerated ?

## 2022-01-29 NOTE — Progress Notes (Signed)
? Inpatient Follow-up/Progress Note ?  ?Patient ID: Marcia Livingston is a 76 y.o. female. ? ?Overnight Events / Subjective Findings ?NAEON. Only c/o some itching relieved by benadryl. No abdominal pain/n/v. Tolerating PO. LFTs relatively unchanged today. Daughter is at bedside today. Pt otherwise resting comfortably. Awaiting ERCP tomorrow. ? ?Review of Systems  ?Constitutional:  Positive for appetite change and unexpected weight change. Negative for activity change, chills and fatigue.  ?HENT:  Negative for trouble swallowing.   ?Respiratory:  Negative for shortness of breath and wheezing.   ?Cardiovascular:  Negative for chest pain and leg swelling.  ?Gastrointestinal:  Negative for abdominal distention, abdominal pain, blood in stool, constipation, diarrhea, nausea and vomiting.  ?Skin:  Positive for color change. Negative for pallor.  ?Neurological:  Negative for dizziness, syncope, weakness and light-headedness.  ?Psychiatric/Behavioral:  Negative for confusion and decreased concentration.    ? ?Medications ? ?Current Facility-Administered Medications:  ?  acetaminophen (TYLENOL) tablet 650 mg, 650 mg, Oral, Q6H PRN **OR** acetaminophen (TYLENOL) suppository 650 mg, 650 mg, Rectal, Q6H PRN, Athena Masse, MD ?  buPROPion (WELLBUTRIN XL) 24 hr tablet 150 mg, 150 mg, Oral, Daily, Sharion Settler, NP ?  camphor-menthol Timoteo Ace) lotion, , Topical, PRN, Sharion Settler, NP, Given at 01/28/22 2240 ?  diphenhydrAMINE (BENADRYL) capsule 25 mg, 25 mg, Oral, Q6H PRN, Enzo Bi, MD, 25 mg at 01/28/22 1828 ?  DULoxetine (CYMBALTA) DR capsule 60 mg, 60 mg, Oral, Daily, Sharion Settler, NP ?  fluticasone furoate-vilanterol (BREO ELLIPTA) 100-25 MCG/ACT 1 puff, 1 puff, Inhalation, Daily **AND** umeclidinium bromide (INCRUSE ELLIPTA) 62.5 MCG/ACT 1 puff, 1 puff, Inhalation, Daily, Sharion Settler, NP ?  gabapentin (NEURONTIN) capsule 300 mg, 300 mg, Oral, TID, Sharion Settler, NP, 300 mg at 01/28/22 2156 ?  LORazepam  (ATIVAN) injection 0.5 mg, 0.5 mg, Intravenous, Once PRN, Athena Masse, MD ?  losartan (COZAAR) tablet 50 mg, 50 mg, Oral, Daily, Sharion Settler, NP ?  ondansetron (ZOFRAN) tablet 4 mg, 4 mg, Oral, Q6H PRN **OR** ondansetron (ZOFRAN) injection 4 mg, 4 mg, Intravenous, Q6H PRN, Athena Masse, MD ?  piperacillin-tazobactam (ZOSYN) IVPB 3.375 g, 3.375 g, Intravenous, Q8H, Enzo Bi, MD, Last Rate: 12.5 mL/hr at 01/29/22 0516, 3.375 g at 01/29/22 0516 ?  [START ON 01/30/2022] rosuvastatin (CRESTOR) tablet 5 mg, 5 mg, Oral, Once per day on Mon Wed Fri, Morrison, Brenda, NP ?  traZODone (DESYREL) tablet 50 mg, 50 mg, Oral, QHS PRN, Sharion Settler, NP, 50 mg at 01/28/22 2155 ? piperacillin-tazobactam (ZOSYN)  IV 3.375 g (01/29/22 0516)  ?  ?acetaminophen **OR** acetaminophen, camphor-menthol, diphenhydrAMINE, LORazepam, ondansetron **OR** ondansetron (ZOFRAN) IV, traZODone  ? ?Objective  ? ? ?Vitals:  ? 01/28/22 0828 01/28/22 1653 01/28/22 2027 01/29/22 0736  ?BP: (!) 142/53 (!) 152/68 (!) 154/56 (!) 144/57  ?Pulse: 66 70 71 62  ?Resp: '18 18 20 18  '$ ?Temp: 97.7 ?F (36.5 ?C) 97.9 ?F (36.6 ?C) 98.5 ?F (36.9 ?C) 98.2 ?F (36.8 ?C)  ?TempSrc: Oral Oral Oral Oral  ?SpO2: 97% 97% 97% 95%  ?Weight:      ?Height:      ? ? ? ?Physical Exam ?Vitals and nursing note reviewed.  ?Constitutional:   ?   General: She is not in acute distress. ?   Appearance: She is obese. She is not toxic-appearing or diaphoretic.  ?HENT:  ?   Head: Normocephalic and atraumatic.  ?   Nose: Nose normal.  ?   Mouth/Throat:  ?   Mouth: Mucous membranes  are moist.  ?   Pharynx: Oropharynx is clear.  ?   Comments: Sublingual jaundice ?Eyes:  ?   General: No scleral icterus. ?   Extraocular Movements: Extraocular movements intact.  ?Cardiovascular:  ?   Rate and Rhythm: Normal rate and regular rhythm.  ?   Heart sounds: Murmur heard.  ?  No friction rub. No gallop.  ?Pulmonary:  ?   Effort: Pulmonary effort is normal. No respiratory distress.  ?   Breath  sounds: Normal breath sounds. No wheezing, rhonchi or rales.  ?Abdominal:  ?   General: Bowel sounds are normal. There is no distension.  ?   Palpations: Abdomen is soft.  ?   Tenderness: There is no abdominal tenderness. There is no guarding or rebound.  ?Musculoskeletal:  ?   Cervical back: Neck supple.  ?   Right lower leg: No edema.  ?   Left lower leg: No edema.  ?Skin: ?   General: Skin is warm and dry.  ?   Coloration: Skin is jaundiced (mild, if any). Skin is not pale.  ?Neurological:  ?   General: No focal deficit present.  ?   Mental Status: She is alert and oriented to person, place, and time. Mental status is at baseline.  ?Psychiatric:     ?   Mood and Affect: Mood normal.     ?   Behavior: Behavior normal.     ?   Thought Content: Thought content normal.     ?   Judgment: Judgment normal.  ? ? ? ?Laboratory Data ?Recent Labs  ?Lab 01/28/22 ?1610 01/29/22 ?9604  ?WBC 7.6 7.2  ?HGB 13.4 13.0  ?HCT 41.4 40.1  ?PLT 241 229  ? ?Recent Labs  ?Lab 01/28/22 ?5409 01/29/22 ?0442  ?NA 137 141  ?K 3.5 3.3*  ?CL 99 104  ?CO2 29 28  ?BUN 13 12  ?CREATININE 0.84 0.87  ?CALCIUM 9.5 8.9  ?PROT 7.5 7.2  ?BILITOT 3.5* 3.3*  ?ALKPHOS 266* 255*  ?ALT 111* 119*  ?AST 78* 87*  ?GLUCOSE 102* 119*  ? ?No results for input(s): INR in the last 168 hours. ?  ? ?Imaging Studies: ?CT ABDOMEN PELVIS W CONTRAST ? ?Result Date: 01/27/2022 ?CLINICAL DATA:  Transaminitis with elevated bilirubin. Nausea and weakness. EXAM: CT ABDOMEN AND PELVIS WITH CONTRAST TECHNIQUE: Multidetector CT imaging of the abdomen and pelvis was performed using the standard protocol following bolus administration of intravenous contrast. RADIATION DOSE REDUCTION: This exam was performed according to the departmental dose-optimization program which includes automated exposure control, adjustment of the mA and/or kV according to patient size and/or use of iterative reconstruction technique. CONTRAST:  138m OMNIPAQUE IOHEXOL 300 MG/ML  SOLN COMPARISON:  Right  upper quadrant ultrasound earlier today. FINDINGS: Lower chest: Subsegmental atelectasis in the lower lobes. No pleural effusion. No basilar pulmonary nodule. Hepatobiliary: There is intrahepatic biliary ductal dilatation of the left greater than right hepatic lobe. Mild left hepatic lobe atrophy. Dilated common bile duct, 12 mm at the porta hepatis. No discrete hepatic lesion. Post cholecystectomy. Pancreas: There is a U shaped low-density adjacent to the pancreatic head that courses over the portal vein, possibly pancreatic in origin. Given the irregular nature measurements are difficult, however this measures at least 5.4 cm in AP dimension, series 2, image 26 inferior component measures 2.4 cm transverse series 2, image 29, craniocaudal dimension of at least 3.4 cm, series 6, image 58. there is no associated pancreatic ductal dilatation or pancreatic atrophy. No peripancreatic  fat stranding. Alternatively this may represent a ring of adenopathy. Spleen: Normal in size without focal abnormality. Pancreatic cleft posteriorly. Adrenals/Urinary Tract: 11 mm low-density left adrenal nodule rising from the medial limb. No right adrenal nodule. No hydronephrosis. There are small water density lesions are is Ng from both kidneys typical of cysts, needing no further characterization or follow-up. No solid renal lesion. The urinary bladder is near completely empty. Stomach/Bowel: Small hiatal hernia. The stomach is decompressed. No small bowel obstruction. No small bowel inflammation. The appendix is not seen. Mild descending and sigmoid colonic diverticulosis without diverticulitis. No colonic wall thickening or evidence colonic mass. Vascular/Lymphatic: 12 mm low-density lymph node at the aortocaval station at the level of the pancreatic head, series 2, image 37, and series 5, image 53. 12 mm periceliac lymph node series 2, image 24. Additional smaller upper retroperitoneal lymph nodes are not enlarged by size criteria.  There is no pelvic adenopathy. Aortic atherosclerosis. The portal, splenic, and mesenteric veins are patent. Reproductive: Hysterectomy.  No adnexal mass. Other: No ascites. No free air. No omental thickening

## 2022-01-29 NOTE — Progress Notes (Signed)
Mobility Specialist - Progress Note ? ? 01/29/22 1200  ?Mobility  ?Activity Ambulated independently in hallway;Stood at bedside;Dangled on edge of bed  ?Level of Assistance Independent  ?Assistive Device None  ?Distance Ambulated (ft) 400 ft  ?Activity Response Tolerated well  ?$Mobility charge 1 Mobility  ? ? ? ?During mobility: 75 HR, 95% SpO2 ? ?Pt supine upon arrival with family at bedside using RA. Pt completes bed mobility ModI and STS indep. Completes ambulation with SUPERVISION voicing no complaints. Pt returns to bed with needs in reach, RN present. ? ?Merrily Brittle ?Mobility Specialist ?01/29/22, 12:37 PM ? ? ?

## 2022-01-29 NOTE — Progress Notes (Signed)
Initial Nutrition Assessment ? ?DOCUMENTATION CODES:  ? ?Obesity unspecified ? ?INTERVENTION:  ?- Encourage PO intake ?- Ensure Enlive po BID, each supplement provides 350 kcal and 20 grams of protein. ?- MVI with minerals daily ? ?NUTRITION DIAGNOSIS:  ? ?Increased nutrient needs related to acute illness as evidenced by estimated needs. ? ?GOAL:  ? ?Patient will meet greater than or equal to 90% of their needs ? ?MONITOR:  ? ?PO intake, Supplement acceptance, Labs, Weight trends ? ?REASON FOR ASSESSMENT:  ? ?Malnutrition Screening Tool ?  ? ?ASSESSMENT:  ? ?Pt admitted with nausea, weakness and elevated LFT's. PMH significant for OSA on CPAP, IBS, DM, COPD, anxiety and depression, colon polyps with recent colonoscopy on 3/1. ? ?RD working remotely. Unsuccessful attempts to reach pt via phone call to room x2. Information obtained by chart review. Noted pt was seen by her PCP on 4/11 with a 1 week complaint of nausea and vomiting with decreased oral intake and generalized weakness. Given poor PO intake, pt would benefit from the addition of nutrition supplement and MVI. Will these, continue to follow up with pt and adjust supplements as necessary.  ? ?CT abdomen and pelvis showed possible pancreatic mass. 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 ? ?Per GI, plans for ERCP tomorrow with stent placement for improved biliary drainage pending pt condition. Also noted plans for biopsy of mass. ? ?Reviewed weight history. Pt noted to have a 3% weight loss within the last year which is not clinically significant for time frame.  ? ?Medications: IV abx ? ?Labs: potassium 3.3, alkaline phosphatase 255, AST 87, ALT 119 ? ?NUTRITION - FOCUSED PHYSICAL EXAM: ?RD working remotely. Deferred to follow up. ? ?Diet Order:   ?Diet Order    ? ?       ?  Diet NPO time specified  Diet effective midnight       ?  ?  Diet regular Room service appropriate? Yes; Fluid consistency: Thin  Diet effective now       ?  ? ?  ?  ? ?  ? ? ?EDUCATION NEEDS:  ? ?No education needs have been identified at this time ? ?Skin:  Skin Assessment: Reviewed RN Assessment ? ?Last BM:  4/15 ? ?Height:  ? ?Ht Readings from Last 1 Encounters:  ?01/27/22 '5\' 1"'$  (1.549 m)  ? ? ?Weight:  ? ?Wt Readings from Last 1 Encounters:  ?01/27/22 91.2 kg  ? ? ?Ideal Body Weight:  47.7 kg ? ?BMI:  Body mass index is 37.98 kg/m?. ? ?Estimated Nutritional Needs:  ? ?Kcal:  1400-1600 ? ?Protein:  70-85g ? ?Fluid:  >/=1.5 ? ?Clayborne Dana, RDN, LDN ?Clinical Nutrition ?

## 2022-01-29 NOTE — Progress Notes (Addendum)
?  Progress Note ? ? ?Patient: Marcia Livingston JIR:[id number] DOB: 04/04/1946 DOA: 01/27/2022     1 ?DOS: the patient was seen and examined on 01/29/2022 ?  ?Brief hospital course: ?No notes on file ? 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 ?Nausea and vomiting ?Secondary to suspected malignancy with obstructive jaundice ?--improved, able to tolerate oral hydration ? ?CAD (coronary artery disease) ?No complaints of chest pain ?Continue rosuvastatin ? ?Common bile duct obstruction ?- lymphadenopathy causing obstruction at CBD leading to dilation (cbd 78m) ?--GI consulted ?Plan: ?--ERCP tomorrow with possible intervention for improved biliary drainage ?-Continue IV zosyn at this time until biliary decompression occurs ? ?COPD (chronic obstructive pulmonary disease) ([place] ?Not acutely exacerbated ?Continue Trelegy Ellipta with DuoNebs as needed ? ?OSA (obstructive sleep apnea) ?CPAP nightly if desired ? ?Depression ?Continue Wellbutrin and Cymbalta and trazodone ? ?HTN (hypertension) ?BP controlled.   ?Continue losartan.   ?hold hydrochlorothiazide ? ? ? ? ?  ? ?Subjective:  ?Pt complained of whole-body itchiness which improved with Benadryl.  Nausea improved, able to eat a little.  No abdominal pain. ? ? ?Physical Exam: ? ?Constitutional: NAD, AAOx3 ?HEENT: conjunctivae and lids normal, EOMI ?CV: No cyanosis.   ?RESP: normal respiratory effort, on RA ?SKIN: warm, dry ?Neuro: II - XII grossly intact.   ?Psych: Normal mood and affect.  Appropriate judgement and reason ? ? ?Data Reviewed: ? ?Family Communication: family updated at bedside today ? ?Disposition: ?Status is: Inpatient ? ? Planned Discharge Destination: Home ? ? ? ?Time spent: 50 minutes ? ?Author: ?TEnzo Bi MD ?01/29/2022  2:04 PM ? ?For on call review [email or web address]  ?

## 2022-01-30 DIAGNOSIS — C221 Intrahepatic bile duct carcinoma: Secondary | ICD-10-CM | POA: Diagnosis not present

## 2022-01-30 LAB — CBC
HCT: 37.9 % (ref 36.0–46.0)
Hemoglobin: 12 g/dL (ref 12.0–15.0)
MCH: 29.5 pg (ref 26.0–34.0)
MCHC: 31.7 g/dL (ref 30.0–36.0)
MCV: 93.1 fL (ref 80.0–100.0)
Platelets: 192 10*3/uL (ref 150–400)
RBC: 4.07 MIL/uL (ref 3.87–5.11)
RDW: 13.2 % (ref 11.5–15.5)
WBC: 6 10*3/uL (ref 4.0–10.5)
nRBC: 0 % (ref 0.0–0.2)

## 2022-01-30 LAB — COMPREHENSIVE METABOLIC PANEL
ALT: 119 U/L — ABNORMAL HIGH (ref 0–44)
AST: 89 U/L — ABNORMAL HIGH (ref 15–41)
Albumin: 3.2 g/dL — ABNORMAL LOW (ref 3.5–5.0)
Alkaline Phosphatase: 233 U/L — ABNORMAL HIGH (ref 38–126)
Anion gap: 7 (ref 5–15)
BUN: 9 mg/dL (ref 8–23)
CO2: 28 mmol/L (ref 22–32)
Calcium: 8.8 mg/dL — ABNORMAL LOW (ref 8.9–10.3)
Chloride: 105 mmol/L (ref 98–111)
Creatinine, Ser: 0.86 mg/dL (ref 0.44–1.00)
GFR, Estimated: >60 mL/min (ref >60)
Glucose, Bld: 116 mg/dL — ABNORMAL HIGH (ref 70–99)
Potassium: 3.7 mmol/L (ref 3.5–5.1)
Sodium: 140 mmol/L (ref 135–145)
Total Bilirubin: 3.8 mg/dL — ABNORMAL HIGH (ref 0.3–1.2)
Total Protein: 6.7 g/dL (ref 6.5–8.1)

## 2022-01-30 LAB — MAGNESIUM: Magnesium: 2.2 mg/dL (ref 1.7–2.4)

## 2022-01-30 LAB — AFP TUMOR MARKER: AFP, Serum, Tumor Marker: 3.9 ng/mL (ref 0.0–9.2)

## 2022-01-30 LAB — CA 19-9 (SERIAL): CA 19-9: 5587 U/mL — ABNORMAL HIGH (ref 0–35)

## 2022-01-30 LAB — CEA: CEA: 7.6 ng/mL — ABNORMAL HIGH (ref 0.0–4.7)

## 2022-01-30 MED ORDER — ALUM & MAG HYDROXIDE-SIMETH 200-200-20 MG/5ML PO SUSP
30.0000 mL | Freq: Four times a day (QID) | ORAL | Status: DC | PRN
  Administered 2022-01-30 – 2022-02-03 (×3): 30 mL via ORAL
  Filled 2022-01-30 (×3): qty 30

## 2022-01-30 MED ORDER — HYDROCHLOROTHIAZIDE 25 MG PO TABS
25.0000 mg | ORAL_TABLET | Freq: Every day | ORAL | Status: DC
  Administered 2022-01-30 – 2022-02-03 (×5): 25 mg via ORAL
  Filled 2022-01-30 (×5): qty 1

## 2022-01-30 NOTE — Progress Notes (Signed)
? Inpatient Follow-up/Progress Note ?  ?Patient ID: Marcia Livingston is a 76 y.o. female. ? ?Overnight Events / Subjective Findings ?NAEON. Resting in comfortably in bed with daughter at bedside. No n/v/d. ?ERCP unfortunately to be delayed until tomorrow. ?No other acute gi complaints. ? ?Review of Systems  ?Constitutional:  Positive for appetite change and unexpected weight change. Negative for activity change, chills and fatigue.  ?HENT:  Negative for trouble swallowing.   ?Respiratory:  Negative for shortness of breath and wheezing.   ?Cardiovascular:  Negative for chest pain and leg swelling.  ?Gastrointestinal:  Negative for abdominal distention, abdominal pain, blood in stool, constipation, diarrhea, nausea and vomiting.  ?Skin:  Positive for color change. Negative for pallor.  ?Neurological:  Negative for dizziness, syncope, weakness and light-headedness.  ?Psychiatric/Behavioral:  Negative for confusion and decreased concentration.    ? ?Medications ? ?Current Facility-Administered Medications:  ?  acetaminophen (TYLENOL) tablet 650 mg, 650 mg, Oral, Q6H PRN, 650 mg at 01/29/22 1523 **OR** acetaminophen (TYLENOL) suppository 650 mg, 650 mg, Rectal, Q6H PRN, Athena Masse, MD ?  ALPRAZolam Duanne Moron) tablet 0.5 mg, 0.5 mg, Oral, BID PRN, Enzo Bi, MD, 0.5 mg at 01/29/22 1523 ?  buPROPion (WELLBUTRIN XL) 24 hr tablet 150 mg, 150 mg, Oral, Daily, Sharion Settler, NP, 150 mg at 01/29/22 4650 ?  camphor-menthol (SARNA) lotion, , Topical, PRN, Sharion Settler, NP, Given at 01/28/22 2240 ?  diphenhydrAMINE (BENADRYL) capsule 25 mg, 25 mg, Oral, Q6H PRN, Enzo Bi, MD, 25 mg at 01/29/22 2158 ?  DULoxetine (CYMBALTA) DR capsule 60 mg, 60 mg, Oral, Daily, Sharion Settler, NP, 60 mg at 01/29/22 3546 ?  feeding supplement (ENSURE ENLIVE / ENSURE PLUS) liquid 237 mL, 237 mL, Oral, BID BM, Enzo Bi, MD ?  fluticasone furoate-vilanterol (BREO ELLIPTA) 100-25 MCG/ACT 1 puff, 1 puff, Inhalation, Daily **AND** umeclidinium  bromide (INCRUSE ELLIPTA) 62.5 MCG/ACT 1 puff, 1 puff, Inhalation, Daily, Sharion Settler, NP ?  gabapentin (NEURONTIN) capsule 300 mg, 300 mg, Oral, QHS, Enzo Bi, MD, 300 mg at 01/29/22 2147 ?  LORazepam (ATIVAN) injection 0.5 mg, 0.5 mg, Intravenous, Once PRN, Athena Masse, MD ?  losartan (COZAAR) tablet 50 mg, 50 mg, Oral, Daily, Sharion Settler, NP, 50 mg at 01/29/22 5681 ?  multivitamin with minerals tablet 1 tablet, 1 tablet, Oral, Daily, Enzo Bi, MD, 1 tablet at 01/29/22 1218 ?  ondansetron (ZOFRAN) tablet 4 mg, 4 mg, Oral, Q6H PRN **OR** ondansetron (ZOFRAN) injection 4 mg, 4 mg, Intravenous, Q6H PRN, Athena Masse, MD ?  piperacillin-tazobactam (ZOSYN) IVPB 3.375 g, 3.375 g, Intravenous, Q8H, Enzo Bi, MD, Last Rate: 12.5 mL/hr at 01/30/22 1353, 3.375 g at 01/30/22 1353 ?  rosuvastatin (CRESTOR) tablet 5 mg, 5 mg, Oral, Once per day on Mon Wed Fri, Morrison, Brenda, NP ?  traZODone (DESYREL) tablet 50 mg, 50 mg, Oral, QHS PRN, Sharion Settler, NP, 50 mg at 01/29/22 2147 ? piperacillin-tazobactam (ZOSYN)  IV 3.375 g (01/30/22 1353)  ?  ?acetaminophen **OR** acetaminophen, ALPRAZolam, camphor-menthol, diphenhydrAMINE, LORazepam, ondansetron **OR** ondansetron (ZOFRAN) IV, traZODone  ? ?Objective  ? ? ?Vitals:  ? 01/29/22 1956 01/30/22 0337 01/30/22 0834 01/30/22 1520  ?BP: (!) 151/66 (!) 146/67 (!) 143/60 (!) 153/57  ?Pulse: 65 60 (!) 56 60  ?Resp: 16 16    ?Temp: 97.9 ?F (36.6 ?C) 98.4 ?F (36.9 ?C) 97.9 ?F (36.6 ?C) 98 ?F (36.7 ?C)  ?TempSrc:      ?SpO2: 97% 99% 96% 98%  ?Weight:      ?  Height:      ? ? ? ?Physical Exam ?Vitals and nursing note reviewed.  ?Constitutional:   ?   General: She is not in acute distress. ?   Appearance: She is obese. She is not toxic-appearing or diaphoretic.  ?HENT:  ?   Head: Normocephalic and atraumatic.  ?   Nose: Nose normal.  ?   Mouth/Throat:  ?   Mouth: Mucous membranes are moist.  ?   Pharynx: Oropharynx is clear.  ?   Comments: Sublingual jaundice ?Eyes:  ?    General: No scleral icterus. ?   Extraocular Movements: Extraocular movements intact.  ?Cardiovascular:  ?   Rate and Rhythm: Normal rate and regular rhythm.  ?   Heart sounds: Murmur heard.  ?  No friction rub. No gallop.  ?Pulmonary:  ?   Effort: Pulmonary effort is normal. No respiratory distress.  ?   Breath sounds: Normal breath sounds. No wheezing, rhonchi or rales.  ?Abdominal:  ?   General: Bowel sounds are normal. There is no distension.  ?   Palpations: Abdomen is soft.  ?   Tenderness: There is no abdominal tenderness. There is no guarding or rebound.  ?Musculoskeletal:  ?   Cervical back: Neck supple.  ?   Right lower leg: No edema.  ?   Left lower leg: No edema.  ?Skin: ?   General: Skin is warm and dry.  ?   Coloration: Skin is jaundiced. Skin is not pale.  ?Neurological:  ?   General: No focal deficit present.  ?   Mental Status: She is alert and oriented to person, place, and time. Mental status is at baseline.  ?Psychiatric:     ?   Mood and Affect: Mood normal.     ?   Behavior: Behavior normal.     ?   Thought Content: Thought content normal.     ?   Judgment: Judgment normal.  ? ? ? ?Laboratory Data ?Recent Labs  ?Lab 01/28/22 ?0981 01/29/22 ?1914 01/30/22 ?0323  ?WBC 7.6 7.2 6.0  ?HGB 13.4 13.0 12.0  ?HCT 41.4 40.1 37.9  ?PLT 241 229 192  ? ?Recent Labs  ?Lab 01/28/22 ?7829 01/29/22 ?5621 01/30/22 ?3086  ?NA 137 141 140  ?K 3.5 3.3* 3.7  ?CL 99 104 105  ?CO2 '29 28 28  '$ ?BUN '13 12 9  '$ ?CREATININE 0.84 0.87 0.86  ?CALCIUM 9.5 8.9 8.8*  ?PROT 7.5 7.2 6.7  ?BILITOT 3.5* 3.3* 3.8*  ?ALKPHOS 266* 255* 233*  ?ALT 111* 119* 119*  ?AST 78* 87* 89*  ?GLUCOSE 102* 119* 116*  ? ?No results for input(s): INR in the last 168 hours. ?  ? ?Imaging Studies: ?CT CHEST WO CONTRAST ? ?Result Date: 01/29/2022 ?CLINICAL DATA:  Pancreatic carcinoma EXAM: CT CHEST WITHOUT CONTRAST TECHNIQUE: Multidetector CT imaging of the chest was performed following the standard protocol without IV contrast. RADIATION DOSE REDUCTION:  This exam was performed according to the departmental dose-optimization program which includes automated exposure control, adjustment of the mA and/or kV according to patient size and/or use of iterative reconstruction technique. COMPARISON:  Previous studies including the CT done on Feb 20, 2022 FINDINGS: Cardiovascular: There are scattered coronary artery calcifications. Calcifications are seen in the region of aortic annulus. Minimal pericardial effusion is seen. Mediastinum/Nodes: There are enlarged lymph nodes in the superior mediastinum with largest of the nodes to the left of aortic arch measures 2.4 x 1.6 cm. There is a subcarinal node measuring 11 mm in short  axis. Without intravenous contrast hilar lymphadenopathy could not be evaluated. There is inhomogeneous attenuation in the thyroid. Lungs/Pleura: There is no focal pulmonary consolidation. Small linear densities in the lower lung fields may suggest minimal scarring or subsegmental atelectasis. There are no discrete lung nodules. There is small calcified granuloma in the lingula. There is no pleural effusion or pneumothorax. Upper Abdomen: There is slight prominence of bile ducts. Head of pancreas is not adequately visualized for evaluation. There is mild nodularity in the both adrenals. Musculoskeletal: Unremarkable. IMPRESSION: There are enlarged lymph nodes in the mediastinum. Largest of the nodes measures 2.4 x 1.6 cm. Possibility of metastatic lymphadenopathy should be considered. Follow-up PET-CT and biopsy as warranted may be considered. There is no focal pulmonary consolidation. There are no discrete lung nodules. There is no pleural effusion. Coronary artery calcifications are seen. Minimal pericardial effusion is present. Other findings as described in the body of the report. Electronically Signed   By: Elmer Picker M.D.   On: 01/29/2022 15:26   ? ?Assessment:  ? 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 ?# Cholestatic transaminitis  ?# Aortic Stenosis ?# nausea wit

## 2022-01-30 NOTE — Plan of Care (Signed)
?  Problem: Pain Managment: Goal: General experience of comfort will improve Outcome: Not Progressing   Problem: Coping: Goal: Level of anxiety will decrease Outcome: Not Progressing   

## 2022-01-30 NOTE — Progress Notes (Signed)
?  Progress Note ? ? ?Patient: Marcia Livingston DDU:[id number] DOB: 08-Oct-1946 DOA: 01/27/2022     2 ?DOS: the patient was seen and examined on 01/30/2022 ?  ?Brief hospital course: ?No notes on file ? 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 ?Nausea and vomiting ?Secondary to suspected malignancy with obstructive jaundice ?--improved, able to tolerate oral hydration ? ?CAD (coronary artery disease) ?No complaints of chest pain ?Continue rosuvastatin ? ?Common bile duct obstruction ?- lymphadenopathy causing obstruction at CBD leading to dilation (cbd 58m) ?--GI consulted ?Plan: ?--ERCP tomorrow with possible intervention for improved biliary drainage ?-Continue IV zosyn at this time until biliary decompression occurs ? ?COPD [place]ch[place] obstructive pulmonary disease[place] ?Not acutely exacerbated ?Continue Trelegy Ellipta with DuoNebs as needed ? ?OSA (obstructive sleep apnea) ?CPAP nightly if desired ? ?Depression ?Continue Wellbutrin and Cymbalta and trazodone ? ?HTN (hypertension) ?Continue losartan.   ?Resume home hydrochlorothiazide ? ? ? ? ?  ? ?Subjective:  ?Pt continued to have no pain.  Had increased anxiety that was helped by Xanax. ? ?ERCP postponed until tomorrow.  IR determined no safe window for biopsy. ? ? ?Physical Exam: ? ?Constitutional: NAD, AAOx3 ?HEENT: conjunctivae and lids normal, EOMI ?CV: No cyanosis.   ?RESP: normal respiratory effort, on RA ?SKIN: warm, dry ?Neuro: II - XII grossly intact.   ?Psych: Normal mood and affect.  Appropriate judgement and reason ? ? ?Data Reviewed: ? ?Family Communication: granddaughter updated at bedside today ? ?Disposition: ?Status is: Inpatient ? ? Planned Discharge Destination: Home ? ? ? ?Time spent: 50 minutes ? ?Author: ?TEnzo Bi MD ?01/30/2022  6:22 PM ? ?For on call review [email or web address]  ?

## 2022-01-31 ENCOUNTER — Inpatient Hospital Stay: Payer: Medicare Other | Admitting: Anesthesiology

## 2022-01-31 ENCOUNTER — Encounter
Admission: EM | Disposition: A | Discharge status: Home / Self Care | Payer: Self-pay | Source: Home / Self Care | Attending: Hospitalist

## 2022-01-31 ENCOUNTER — Inpatient Hospital Stay: Payer: Medicare Other

## 2022-01-31 ENCOUNTER — Encounter: Payer: Self-pay | Admitting: Internal Medicine

## 2022-01-31 DIAGNOSIS — K8689 Other specified diseases of pancreas: Secondary | ICD-10-CM

## 2022-01-31 DIAGNOSIS — C221 Intrahepatic bile duct carcinoma: Secondary | ICD-10-CM | POA: Diagnosis not present

## 2022-01-31 HISTORY — PX: ERCP: SHX5425

## 2022-01-31 LAB — CBC
HCT: 41.2 % (ref 36.0–46.0)
Hemoglobin: 12.9 g/dL (ref 12.0–15.0)
MCH: 29.1 pg (ref 26.0–34.0)
MCHC: 31.3 g/dL (ref 30.0–36.0)
MCV: 92.8 fL (ref 80.0–100.0)
Platelets: 230 10*3/uL (ref 150–400)
RBC: 4.44 MIL/uL (ref 3.87–5.11)
RDW: 13.2 % (ref 11.5–15.5)
WBC: 7.3 10*3/uL (ref 4.0–10.5)
nRBC: 0 % (ref 0.0–0.2)

## 2022-01-31 LAB — COMPREHENSIVE METABOLIC PANEL
ALT: 126 U/L — ABNORMAL HIGH (ref 0–44)
AST: 87 U/L — ABNORMAL HIGH (ref 15–41)
Albumin: 3.5 g/dL (ref 3.5–5.0)
Alkaline Phosphatase: 283 U/L — ABNORMAL HIGH (ref 38–126)
Anion gap: 7 (ref 5–15)
BUN: 10 mg/dL (ref 8–23)
CO2: 28 mmol/L (ref 22–32)
Calcium: 9.4 mg/dL (ref 8.9–10.3)
Chloride: 104 mmol/L (ref 98–111)
Creatinine, Ser: 0.73 mg/dL (ref 0.44–1.00)
GFR, Estimated: >60 mL/min (ref >60)
Glucose, Bld: 117 mg/dL — ABNORMAL HIGH (ref 70–99)
Potassium: 3.8 mmol/L (ref 3.5–5.1)
Sodium: 139 mmol/L (ref 135–145)
Total Bilirubin: 4.9 mg/dL — ABNORMAL HIGH (ref 0.3–1.2)
Total Protein: 7.5 g/dL (ref 6.5–8.1)

## 2022-01-31 LAB — MAGNESIUM: Magnesium: 2.2 mg/dL (ref 1.7–2.4)

## 2022-01-31 SURGERY — ERCP, WITH INTERVENTION IF INDICATED
Anesthesia: General

## 2022-01-31 MED ORDER — OXYCODONE HCL 5 MG PO TABS
5.0000 mg | ORAL_TABLET | ORAL | Status: DC | PRN
  Administered 2022-01-31 – 2022-02-01 (×6): 10 mg via ORAL
  Administered 2022-02-02 (×2): 5 mg via ORAL
  Filled 2022-01-31: qty 2
  Filled 2022-01-31: qty 1
  Filled 2022-01-31 (×2): qty 2
  Filled 2022-01-31: qty 1
  Filled 2022-01-31 (×3): qty 2

## 2022-01-31 MED ORDER — FENTANYL CITRATE PF 50 MCG/ML IJ SOSY
PREFILLED_SYRINGE | INTRAMUSCULAR | Status: AC
  Filled 2022-01-31: qty 1

## 2022-01-31 MED ORDER — OXYCODONE HCL 5 MG PO TABS
5.0000 mg | ORAL_TABLET | Freq: Four times a day (QID) | ORAL | Status: DC | PRN
  Administered 2022-01-31: 5 mg via ORAL
  Filled 2022-01-31: qty 1

## 2022-01-31 MED ORDER — LIDOCAINE HCL (CARDIAC) PF 100 MG/5ML IV SOSY
PREFILLED_SYRINGE | INTRAVENOUS | Status: DC | PRN
  Administered 2022-01-31: 100 mg via INTRAVENOUS

## 2022-01-31 MED ORDER — SUCCINYLCHOLINE CHLORIDE 200 MG/10ML IV SOSY
PREFILLED_SYRINGE | INTRAVENOUS | Status: DC | PRN
  Administered 2022-01-31: 100 mg via INTRAVENOUS

## 2022-01-31 MED ORDER — PHENOL 1.4 % MT LIQD
1.0000 | OROMUCOSAL | Status: DC | PRN
  Filled 2022-01-31: qty 177

## 2022-01-31 MED ORDER — PROPOFOL 10 MG/ML IV BOLUS
INTRAVENOUS | Status: DC | PRN
  Administered 2022-01-31: 30 mg via INTRAVENOUS
  Administered 2022-01-31: 100 mg via INTRAVENOUS

## 2022-01-31 MED ORDER — SODIUM CHLORIDE 0.9 % IV SOLN: INTRAVENOUS | Status: DC | PRN

## 2022-01-31 MED ORDER — PHENYLEPHRINE HCL (PRESSORS) 10 MG/ML IV SOLN
INTRAVENOUS | Status: DC | PRN
  Administered 2022-01-31: 80 ug via INTRAVENOUS
  Administered 2022-01-31: 40 ug via INTRAVENOUS

## 2022-01-31 MED ORDER — HYDROCODONE-ACETAMINOPHEN 5-325 MG PO TABS: 1.0000 | ORAL_TABLET | ORAL | Status: DC | PRN

## 2022-01-31 MED ORDER — INDOMETHACIN 50 MG RE SUPP: 100.0000 mg | Freq: Once | RECTAL | Status: DC

## 2022-01-31 MED ORDER — SUGAMMADEX SODIUM 200 MG/2ML IV SOLN
INTRAVENOUS | Status: DC | PRN
Start: 2022-01-31 — End: 2022-01-31
  Administered 2022-01-31: 500 mg via INTRAVENOUS

## 2022-01-31 MED ORDER — ROCURONIUM BROMIDE 100 MG/10ML IV SOLN
INTRAVENOUS | Status: DC | PRN
  Administered 2022-01-31: 10 mg via INTRAVENOUS
  Administered 2022-01-31: 30 mg via INTRAVENOUS

## 2022-01-31 MED ORDER — BOOST / RESOURCE BREEZE PO LIQD CUSTOM
1.0000 | Freq: Three times a day (TID) | ORAL | Status: DC
  Administered 2022-01-31 – 2022-02-02 (×2): 1 via ORAL

## 2022-01-31 MED ORDER — INDOMETHACIN 50 MG RE SUPP
RECTAL | Status: AC
  Filled 2022-01-31: qty 2

## 2022-01-31 MED ORDER — FENTANYL CITRATE PF 50 MCG/ML IJ SOSY
50.0000 ug | PREFILLED_SYRINGE | Freq: Once | INTRAMUSCULAR | Status: AC
  Administered 2022-01-31: 50 ug via INTRAVENOUS

## 2022-01-31 MED ORDER — PANTOPRAZOLE SODIUM 40 MG PO TBEC
40.0000 mg | DELAYED_RELEASE_TABLET | Freq: Every day | ORAL | Status: DC
Start: 2022-01-31 — End: 2022-02-03
  Administered 2022-01-31 – 2022-02-03 (×4): 40 mg via ORAL
  Filled 2022-01-31 (×4): qty 1

## 2022-01-31 MED ORDER — ESMOLOL HCL 100 MG/10ML IV SOLN
INTRAVENOUS | Status: DC | PRN
Start: 2022-01-31 — End: 2022-01-31
  Administered 2022-01-31: 20 mg via INTRAVENOUS

## 2022-01-31 NOTE — Care Plan (Signed)
Brief GI Note ? ?Pt scheduled for ERCP with possible intervention today ?CA19-9 is grossly elevated >5500 ?Abx can likely be discontinued tomorrow ?Will need outpatient arrangement for endoscopic ultrasound for biopsy ?Ok to eat after ERCP ?Hold dvt ppx and nsaids after ercp if sphincterotomy performed ?Total bili increasing to 4.9 today. Other transaminases remain stable. CBC remains wnl ?CT Chest concerning for other LN involvement /mets ?Appreciate Oncology assistance ?Will continue to follow ?Please see ERCP note for further information post operatively ? ?Annamaria Helling, DO ?[facility]  ?

## 2022-01-31 NOTE — Transfer of Care (Signed)
Immediate Anesthesia Transfer of Care Note ? ?Patient: Marcia Livingston ? ?Procedure(s) Performed: ENDOSCOPIC RETROGRADE CHOLANGIOPANCREATOGRAPHY (ERCP) ? ?Patient Location: Endoscopy Unit ? ?Anesthesia Type:General ? ?Level of Consciousness: awake, drowsy and patient cooperative ? ?Airway & Oxygen Therapy: Patient Spontanous Breathing and Patient connected to face mask oxygen ? ?Post-op Assessment: Report given to RN and Post -op Vital signs reviewed and stable ? ?Post vital signs: Reviewed and stable ? ?Last Vitals:  ?Vitals Value Taken Time  ?BP    ?Temp    ?Pulse 72 01/31/22 1255  ?Resp 18 01/31/22 1255  ?SpO2 100 % 01/31/22 1255  ?Vitals shown include unvalidated device data. ? ?Last Pain:  ?Vitals:  ? 01/31/22 1139  ?TempSrc: Temporal  ?PainSc: 0-No pain  ?   ? ?  ? ?Complications: No notable events documented. ?

## 2022-01-31 NOTE — Anesthesia Preprocedure Evaluation (Addendum)
Anesthesia Evaluation  ?Patient identified by MRN, date of birth, ID band ?Patient awake ? ? ? ?Reviewed: ?Allergy & Precautions, H&P , NPO status , Patient's Chart, lab work & pertinent test results, reviewed documented beta blocker date and time  ? ?History of Anesthesia Complications ?Negative for: history of anesthetic complications ? ?Airway ?Mallampati: IV ? ? ?Neck ROM: full ? ? ? Dental ? ?(+) Caps ?  ?Pulmonary ?sleep apnea and Continuous Positive Airway Pressure Ventilation , COPD, former smoker (quit 1997),  ?  ?Pulmonary exam normal ? ? ? ? ? ? ? Cardiovascular ?Exercise Tolerance: Poor ?hypertension, + CAD  ?+ Valvular Problems/Murmurs (mild-mod AS)  ?Rhythm:Regular Rate:Normal ?+ Systolic murmurs ?ECG 01/27/22:  ?Normal sinus rhythm ?Low voltage QRS ?Cannot rule out Anterior infarct , age undetermined ? 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 ?Myocardial perfusion 12/30/20:  ?1. ?Normal left ventricular function  ?2. ?No regional wall motion maladies  ?3.  No evidence for scar or ischemia ?  ?Neuro/Psych ?PSYCHIATRIC DISORDERS Anxiety Depression negative neurological ROS ?   ? GI/Hepatic ?Neg liver ROS, GERD  Medicated,Cholangiocarcinoma  ?  ?Endo/Other  ?Obesity; prediabetes ? Renal/GU ?negative Renal ROS  ? ?  ?Musculoskeletal ? ?(+) Arthritis ,  ? Abdominal ?  ?Peds ? Hematology ?negative hematology ROS ?(+)   ?Anesthesia Other Findings ? ? Reproductive/Obstetrics ? ?  ? ? ? ? ? ? ? ? ? ? ? ? ? ?  ?  ? ? ? ? ? ? ?Anesthesia Physical ?Anesthesia Plan ? ?ASA: 3 ? ?Anesthesia Plan: General  ? ?Post-op Pain Management:   ? ?Induction: Intravenous ? ?PONV Risk Score and Plan: 3 and Ondansetron, Dexamethasone and Treatment may vary due to age or  medical condition ? ?Airway Management Planned: Oral ETT ? ?Additional Equipment:  ? ?Intra-op Plan:  ? ?Post-operative Plan: Extubation in OR ? ?Informed Consent: I have reviewed the patients History and Physical, chart, labs and discussed the procedure including the risks, benefits and alternatives for the proposed anesthesia with the patient or authorized representative who has indicated his/her understanding and acceptance.  ? ? ? ?Dental Advisory Given ? ?Plan Discussed with: CRNA ? ?Anesthesia Plan Comments: (Patient consented for risks of anesthesia including but not limited to:  ?- adverse reactions to medications ?- damage to eyes, teeth, lips or other oral mucosa ?- nerve damage due to positioning  ?- sore throat or hoarseness ?- damage to heart, brain, nerves, lungs, other parts of body or loss of life ? ?Informed patient about role of CRNA in peri- and intra-operative care.  Patient voiced understanding.)  ? ? ? ? ? ?Anesthesia Quick Evaluation ? ?

## 2022-01-31 NOTE — Anesthesia Procedure Notes (Addendum)
Procedure Name: General with mask airway ?Date/Time: 01/31/2022 12:14 PM ?Performed by: Kelton Pillar, CRNA ?Pre-anesthesia Checklist: Patient identified, Emergency Drugs available, Suction available and Patient being monitored ?Patient Re-evaluated:Patient Re-evaluated prior to induction ?Oxygen Delivery Method: Circle system utilized ?Preoxygenation: Pre-oxygenation with 100% oxygen ?Induction Type: IV induction ?Ventilation: Mask ventilation without difficulty ?Laryngoscope Size: Glidescope and 3 ?Grade View: Grade I ?Tube type: Oral ?Tube size: 6.5 mm ?Number of attempts: 1 ?Airway Equipment and Method: Stylet and Oral airway ?Placement Confirmation: positive ETCO2, CO2 detector, breath sounds checked- equal and bilateral and ETT inserted through vocal cords under direct vision ?Secured at: 21 cm ?Tube secured with: Tape ?Dental Injury: Teeth and Oropharynx as per pre-operative assessment  ? ? ? ? ?

## 2022-01-31 NOTE — Progress Notes (Signed)
?  Progress Note ? ? ?Patient: Marcia Livingston RXV:[id number] DOB: Apr 21, 1946 DOA: 01/27/2022     3 ?DOS: the patient was seen and examined on 01/31/2022 ?  ?Brief hospital course: ?No notes on file ? 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 ?Nausea and vomiting ?Secondary to suspected malignancy with obstructive jaundice ?--improved, able to tolerate oral hydration ? ?CAD (coronary artery disease) ?No complaints of chest pain ?Continue rosuvastatin ? ?Common bile duct obstruction ?--ECRP today.  A biliary sphincterotomy was performed and One covered metal stent was placed into the common bile duct. ?-Continue IV zosyn for now ?--oxycodone 10 mg q4h PRN for pain ?--monitor for pancreatitis, bleeding, perforation, and cholangitis. ?--clear liquid diet now, advance as tolerated ? ?COPD (chronic obstructive pulmonary disease) ([place]) ?Not acutely exacerbated ?Continue Trelegy Ellipta with DuoNebs as needed ? ?OSA (obstructive sleep apnea) ?CPAP nightly if desired ? ?Depression ?Continue Wellbutrin and Cymbalta and trazodone ? ?HTN (hypertension) ?Continue losartan and hydrochlorothiazide ? ? ? ? ?  ? ?Subjective:  ?Pt went for ECRP today.  A biliary sphincterotomy was performed and One covered metal stent was placed into the common bile duct. ? ?After the procedure, pt had severe pain.  Oxycodone 10 mg q4h PRN ordered. ? ? ?Physical Exam: ? ?Constitutional: NAD, AAOx3 ?HEENT: conjunctivae and lids normal, EOMI ?CV: No cyanosis.   ?RESP: normal respiratory effort, on RA ?SKIN: warm, dry ?Neuro: II - XII grossly intact.   ?Psych: Normal mood and affect.  Appropriate judgement and reason ? ? ? ?Data Reviewed: ? ?Family Communication: granddaughter and family updated at bedside today ? ?Disposition: ?Status is:  Inpatient ? ? Planned Discharge Destination: Home possibly tomorrow if GI clears and no complication from ERCP  ? ? ? ? ?Time spent: 50 minutes ? ?Author: ?Enzo Bi, MD ?01/31/2022 7:08 PM ? ?For on call review [email or web address].  ?

## 2022-01-31 NOTE — Op Note (Signed)
[facility] ?Gastroenterology ?Patient Name: Marcia Livingston ?Procedure Date: 01/31/2022 12:06 PM ?MRN: [id number] ?Account #: [id number] ?Date of Birth: November 18, 1945 ?Admit Type: Inpatient ?Age: 76 ?Room: [facility] ENDO ROOM 4 ?Gender: Female ?Note Status: Finalized ?Instrument Name: EXALT Ercp scope ?Procedure:             ERCP ?Indications:           Malignant tumor of the head of pancreas ?Providers:             Lucilla Lame MD, MD ?Referring MD:          Biagio Borg, MD (Referring MD) ?Medicines:             General Anesthesia ?Complications:         No immediate complications. ?Procedure:             Pre-Anesthesia Assessment: ?                       - Prior to the procedure, a History and Physical was  ?                       performed, and patient medications and allergies were  ?                       reviewed. The patient's tolerance of previous  ?                       anesthesia was also reviewed. The risks and benefits  ?                       of the procedure and the sedation options and risks  ?                       were discussed with the patient. All questions were  ?                       answered, and informed consent was obtained. Prior  ?                       Anticoagulants: The patient has taken no previous  ?                       anticoagulant or antiplatelet agents. ASA Grade  ?                       Assessment: III - A patient with severe systemic  ?                       disease. After reviewing the risks and benefits, the  ?                       patient was deemed in satisfactory condition to  ?                       undergo the procedure. ?                       After obtaining informed consent, the scope was passed  ?  under direct vision. Throughout the procedure, the  ?                       patient's blood pressure, pulse, and oxygen  ?                       saturations were monitored continuously. The [place]  ?                       [employer]ific [employer]  D single use duodenoscope was  ?                       introduced through the mouth, and used to inject  ?                       contrast into and used to inject contrast into the  ?                       bile duct. The ERCP was accomplished without  ?                       difficulty. The patient tolerated the procedure well. ?Findings: ?     The scout film was normal. The esophagus was successfully intubated  ?     under direct vision. The scope was advanced to a normal major papilla in  ?     the descending duodenum without detailed examination of the pharynx,  ?     larynx and associated structures, and upper GI tract. The upper GI tract  ?     was grossly normal. The bile duct was deeply cannulated with the  ?     short-nosed traction sphincterotome. Contrast was injected. I personally  ?     interpreted the bile duct images. There was brisk flow of contrast  ?     through the ducts. Image quality was excellent. Contrast extended to the  ?     main bile duct. The upper third of the main bile duct was diffusely  ?     dilated. The middle third of the main bile duct contained a single  ?     segmental stenosis. A wire was passed into the biliary tree. An 8 mm  ?     biliary sphincterotomy was made with a traction (standard)  ?     sphincterotome using ERBE electrocautery. There was no  ?     post-sphincterotomy bleeding. One 10 mm by 8 cm covered metal stent was  ?     placed 6 cm into the common bile duct. Bile flowed through the stent.  ?     The stent was in good position. ?Impression:            - A single segmental biliary stricture was found in  ?                       the middle third of the main bile duct. The stricture  ?                       was malignant appearing. ?                       - The upper  third of the main bile duct was dilated. ?                       - A biliary sphincterotomy was performed. ?                       - One covered metal stent was placed into the common  ?                        bile duct. ?Recommendation:        - Return patient to hospital ward for ongoing care. ?                       - Clear liquid diet today. ?                       - Watch for pancreatitis, bleeding, perforation, and  ?                       cholangitis. ?Procedure Code(s):     --- Professional --- ?                       [phone], Endoscopic retrograde cholangiopancreatography  ?                       (ERCP); with placement of endoscopic stent into  ?                       biliary or pancreatic duct, including pre- and  ?                       post-dilation and guide wire passage, when performed,  ?                       including sphincterotomy, when performed, each stent ?                       [id number], Endoscopic catheterization of the biliary  ?                       ductal system, radiological supervision and  ?                       interpretation ?Diagnosis Code(s):     --- Professional --- ?                       C25.0, Malignant neoplasm of head of pancreas ?                       K83.8, Other specified diseases of biliary tract ?                       K83.1, Obstruction of bile duct ?CPT copyright 2019 American Medical Association. All rights reserved. ?The codes documented in this report are preliminary and upon coder review may  ?be revised to meet current compliance requirements. ?Lucilla Lame MD, MD ?01/31/2022 12:49:53 PM ?This report has been signed electronically. ?Number of Addenda: 0 ?Note Initiated On: 01/31/2022 12:06 PM ?Estimated Blood Loss:  Estimated blood loss: none. ?  [facility] ?

## 2022-01-31 NOTE — Progress Notes (Signed)
[name] complained of abdominal pain 8/10 while in the recovery area after ERCP. Anesthesiologist was contacted and an order for 50 mcg of fentanyl was provided. Fentanyl was administered per order. Ms Marcia Livingston rested more comfortably afterwards. Vital signs remained stable. Report was called to primary RN. Family was updated. [name] is currently being transported back to room 228. Hospitalist was also messaged for on-going pain medication needs. ?

## 2022-01-31 NOTE — H&P (View-Only) (Signed)
? ?CHIEF COMPLAINTS/PURPOSE OF CONSULTATION:  ?Liver mass/biliary ? ?Marcia Livingston 76 y.o.  female currently admitted to hospital for nausea jaundice.  Noted to have liver masses; elevated liver function. ? ?Patient continues to have abdominal discomfort nausea.  Appetite is poor.  Currently n.p.o. awaiting ERCP in the morning ? ? ?MEDICAL HISTORY:  ?Past Medical History:  ?Diagnosis Date  ? Anxiety   ? Arthritis   ? CAD (coronary artery disease)   ? Cancer [facility])   ? SKIN  ? Colon polyps   ? COPD (chronic obstructive pulmonary disease) ([place])   ? Depression   ? Depression   ? Fibrocystic breast disease   ? GERD (gastroesophageal reflux disease)   ? Heart murmur   ? High cholesterol   ? Hyperlipidemia   ? Hypertension   ? IBS (irritable bowel syndrome)   ? IBS (irritable bowel syndrome)   ? Obesity   ? Osteoporosis   ? Overactive bladder   ? Sleep apnea   ? Sleep apnea   ? ? ?SURGICAL HISTORY: ?Past Surgical History:  ?Procedure Laterality Date  ? ABDOMINAL HYSTERECTOMY    ? total  ? APPENDECTOMY    ? BREAST EXCISIONAL BIOPSY Right YRS AGO  ? NEG  ? CATARACT EXTRACTION W/PHACO Right 02/01/2016  ? Procedure: CATARACT EXTRACTION PHACO AND INTRAOCULAR LENS PLACEMENT (IOC);  Surgeon: Birder Robson, MD;  Location: [facility] ORS;  Service: Ophthalmology;  Laterality: Right;  [place]    00:37.2 ?AP%  20.7% ?CDE  7.69 ?fluid pack lot # [id number]  ? CATARACT EXTRACTION W/PHACO Left 02/22/2016  ? Procedure: CATARACT EXTRACTION PHACO AND INTRAOCULAR LENS PLACEMENT (IOC);  Surgeon: Birder Robson, MD;  Location: [facility] ORS;  Service: Ophthalmology;  Laterality: Left;  [place] 00:40 ?AP% 19.9 ?CDE 8.06 ?fluid pack lot # [id number]  ? CHOLECYSTECTOMY    ? COLONOSCOPY WITH PROPOFOL N/A 01/13/2016  ? Procedure: COLONOSCOPY WITH PROPOFOL;  Surgeon: Manya Silvas, MD;  Location: [facility] ENDOSCOPY;  Service: Endoscopy;  Laterality: N/A;  ? COLONOSCOPY WITH PROPOFOL N/A 12/14/2021  ? Procedure: COLONOSCOPY WITH PROPOFOL;  Surgeon: Toledo, Benay Pike, MD;   Location: [facility] ENDOSCOPY;  Service: Gastroenterology;  Laterality: N/A;  ? FOOT SURGERY Left   ? for breakdown of arch  ? OVARIAN CYST SURGERY    ? 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 Right [place]  ? ? ?SOCIAL HISTORY: ?Social History  ? ?Socioeconomic History  ? Marital status: Married  ?  Spouse name: Orpah Cobb. Musick  ? Number of children: 2  ? Years of education: 82  ? Highest education level: High school graduate  ?Occupational History  ? 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 Smoking status: Former  ?  Packs/day: 1.00  ?  Years: 30.00  ?  Pack years: 30.00  ?  Types: Cigarettes  ?  Quit date: 10/15/1996  ?  Years since quitting: 25.3  ? Smokeless tobacco: Never  ?Vaping Use  ? Vaping Use: Never used  ?Substance and Sexual Activity  ? Alcohol use: Yes  ?  Comment: wine rarely  ? Drug use: No  ? Sexual activity: Not Currently  ?  Partners: Male  ?  Birth control/protection: Surgical  ?Other Topics Concern  ? Not on file  ?Social History Narrative  ? Pt states she has raised her granddaughter, and considers the granddaughter her child.  ? ?Social Determinants of Health  ? ?Financial Resource Strain: Not on file  ?Food Insecurity: Not on file  ?  Transportation Needs: Not on file  ?Physical Activity: Not on file  ?Stress: Not on file  ?Social Connections: Not on file  ?Intimate Partner Violence: Not on file  ? ? ?FAMILY HISTORY: ?Family History  ?Problem Relation Age of Onset  ? Colon cancer Mother   ?     thought to be mets from breast cancer  ? Stroke Mother   ? Hypertension Mother   ? Heart attack Mother   ? Breast cancer Mother 73  ? Emphysema Father   ? Prostate cancer Father   ? Throat cancer Daughter   ? Breast cancer Maternal Aunt   ?     51's  ? Rheum arthritis Daughter   ? Hypertension Daughter   ? Reye's syndrome Daughter   ?     history of  ? Hepatitis C Daughter   ? Ovarian cancer Neg Hx   ? ? ?ALLERGIES:  is allergic to penicillins, codeine, and sulfa antibiotics. ? ?MEDICATIONS:   ?Current Facility-Administered Medications  ?Medication Dose Route Frequency Provider Last Rate Last Admin  ? acetaminophen (TYLENOL) tablet 650 mg  650 mg Oral Q6H PRN Athena Masse, MD   650 mg at 01/29/22 1523  ? Or  ? acetaminophen (TYLENOL) suppository 650 mg  650 mg Rectal Q6H PRN Athena Masse, MD      ? ALPRAZolam Duanne Moron) tablet 0.5 mg  0.5 mg Oral BID PRN Enzo Bi, MD   0.5 mg at 01/31/22 0853  ? alum & mag hydroxide-simeth (MAALOX/MYLANTA) 200-200-20 MG/5ML suspension 30 mL  30 mL Oral Q6H PRN Enzo Bi, MD   30 mL at 01/30/22 2144  ? buPROPion (WELLBUTRIN XL) 24 hr tablet 150 mg  150 mg Oral Daily Sharion Settler, NP   150 mg at 01/29/22 1856  ? camphor-menthol (SARNA) lotion   Topical PRN Sharion Settler, NP   Given at 01/28/22 2240  ? diphenhydrAMINE (BENADRYL) capsule 25 mg  25 mg Oral Q6H PRN Enzo Bi, MD   25 mg at 01/31/22 1434  ? DULoxetine (CYMBALTA) DR capsule 60 mg  60 mg Oral Daily Sharion Settler, NP   60 mg at 01/29/22 3149  ? feeding supplement (BOOST / RESOURCE BREEZE) liquid 1 Container  1 Container Oral TID BM Enzo Bi, MD      ? fentaNYL (SUBLIMAZE) 50 MCG/ML injection           ? fluticasone furoate-vilanterol (BREO ELLIPTA) 100-25 MCG/ACT 1 puff  1 puff Inhalation Daily Sharion Settler, NP      ? And  ? umeclidinium bromide (INCRUSE ELLIPTA) 62.5 MCG/ACT 1 puff  1 puff Inhalation Daily Sharion Settler, NP      ? gabapentin (NEURONTIN) capsule 300 mg  300 mg Oral QHS Enzo Bi, MD   300 mg at 01/30/22 2115  ? hydrochlorothiazide (HYDRODIURIL) tablet 25 mg  25 mg Oral Daily Enzo Bi, MD   25 mg at 01/31/22 0845  ? indomethacin (INDOCIN) 50 MG suppository           ? LORazepam (ATIVAN) injection 0.5 mg  0.5 mg Intravenous Once PRN Athena Masse, MD      ? losartan (COZAAR) tablet 50 mg  50 mg Oral Daily Sharion Settler, NP   50 mg at 01/31/22 0845  ? multivitamin with minerals tablet 1 tablet  1 tablet Oral Daily Enzo Bi, MD   1 tablet at 01/29/22 1218  ? ondansetron  (ZOFRAN) tablet 4 mg  4 mg Oral Q6H PRN Athena Masse,  MD      ? Or  ? ondansetron (ZOFRAN) injection 4 mg  4 mg Intravenous Q6H PRN Athena Masse, MD      ? oxyCODONE (Oxy IR/ROXICODONE) immediate release tablet 5-10 mg  5-10 mg Oral Q4H PRN Enzo Bi, MD   10 mg at 01/31/22 1828  ? pantoprazole (PROTONIX) EC tablet 40 mg  40 mg Oral Daily Enzo Bi, MD   40 mg at 01/31/22 1402  ? phenol (CHLORASEPTIC) mouth spray 1 spray  1 spray Mouth/Throat PRN Enzo Bi, MD      ? piperacillin-tazobactam (ZOSYN) IVPB 3.375 g  3.375 g Intravenous Q8H Enzo Bi, MD 12.5 mL/hr at 01/31/22 1401 3.375 g at 01/31/22 1401  ? rosuvastatin (CRESTOR) tablet 5 mg  5 mg Oral Once per day on Mon Wed Fri Sharion Settler, NP      ? traZODone (DESYREL) tablet 50 mg  50 mg Oral QHS PRN Sharion Settler, NP   50 mg at 01/29/22 2147  ? ? ?  ?. ? ?PHYSICAL EXAMINATION: ? ?Vitals:  ? 01/31/22 1557 01/31/22 2006  ?BP: (!) 174/56 (!) 147/54  ?Pulse: (!) 58 65  ?Resp: 16 19  ?Temp: 98.7 ?F (37.1 ?C) 98.1 ?F (36.7 ?C)  ?SpO2: 94% 94%  ? ?Filed Weights  ? 01/27/22 1730 01/31/22 1139  ?Weight: 201 lb (91.2 kg) 201 lb (91.2 kg)  ? ? ?Physical Exam ?Vitals and nursing note reviewed.  ?HENT:  ?   Head: Normocephalic and atraumatic.  ?   Mouth/Throat:  ?   Pharynx: Oropharynx is clear.  ?Eyes:  ?   Extraocular Movements: Extraocular movements intact.  ?   Pupils: Pupils are equal, round, and reactive to light.  ?Cardiovascular:  ?   Rate and Rhythm: Normal rate and regular rhythm.  ?Pulmonary:  ?   Comments: Decreased breath sounds bilaterally.  ?Abdominal:  ?   Palpations: Abdomen is soft.  ?Musculoskeletal:     ?   General: Normal range of motion.  ?   Cervical back: Normal range of motion.  ?Skin: ?   General: Skin is warm.  ?Neurological:  ?   General: No focal deficit present.  ?   Mental Status: She is alert and oriented to person, place, and time.  ?Psychiatric:     ?   Behavior: Behavior normal.     ?   Judgment: Judgment normal.   ? ? ? ?LABORATORY DATA:  ?I have reviewed the data as listed ?Lab Results  ?Component Value Date  ? WBC 7.3 01/31/2022  ? HGB 12.9 01/31/2022  ? HCT 41.2 01/31/2022  ? MCV 92.8 01/31/2022  ? PLT 230 01/31/2022  ? ?Recent

## 2022-01-31 NOTE — Progress Notes (Addendum)
Nutrition Follow-up ? ?DOCUMENTATION CODES:  ? ?Obesity unspecified ? ?INTERVENTION:  ?- Encourage PO intake ?- Discontinue Ensure Enlive ?- Boost Breeze po TID, each supplement provides 250 kcal and 9 grams of protein ?- MVI with minerals daily ? ?NUTRITION DIAGNOSIS:  ? ?Increased nutrient needs related to acute illness as evidenced by estimated needs. ? ?Ongoing ? ?GOAL:  ? ?Patient will meet greater than or equal to 90% of their needs ? ?Addressing needs via meals and nutrition supplements ? ?MONITOR:  ? ?PO intake, Supplement acceptance, Labs, Weight trends ? ?REASON FOR ASSESSMENT:  ? ?Malnutrition Screening Tool ?  ? ?ASSESSMENT:  ? ?Pt admitted with nausea, weakness and elevated LFT's. PMH significant for OSA on CPAP, IBS, DM, COPD, anxiety and depression, colon polyps with recent colonoscopy on 3/1. ? ?04/18: s/p ERCP with stent placement in biliary/pancreatic duct ? ?Pt resting, just returning back to room from procedure. Spoke with family at bedside. They state that she typically eats 2 small meals per day at home and snacks often throughout the day on fruit. The family reports that within the past 3 weeks she has been eating a little more than normal and has eaten really well throughout admission which they attribute to medical management and IV fluids. She does not typically like milk type drinks and they state that she did not enjoy the Ensure supplement. Given she is on a clear liquid diet and reportedly eating well, will switch supplement to Boost Breeze to see if this is a more tolerable supplement.  ? ?Pt's family states that her usual weight is 211 lbs and reports a weight loss of ~10-12 lbs within the last 3 weeks. Per review of weight history, unable to confirm this weight loss however noted pt has had a 4% weight loss since 02/28/21 which is not clinically significant for time frame.  ? ?Medications: MVI, protonix, IV abx ? ?Labs: alkaline phosphatase 283, AST 87, ALT 126, total bilirubin  4.9 ? ?NUTRITION - FOCUSED PHYSICAL EXAM: ?Pt resting, recently returned to room from procedure. Family preferred to defer to follow up.  ? ?Diet Order:   ?Diet Order   ? ?       ?  Diet clear liquid Room service appropriate? Yes; Fluid consistency: Thin  Diet effective now       ?  ? ?  ?  ? ?  ? ?EDUCATION NEEDS:  ? ?No education needs have been identified at this time ? ?Skin:  Skin Assessment: Reviewed RN Assessment ? ?Last BM:  04/18 ? ?Height:  ? ?Ht Readings from Last 1 Encounters:  ?01/31/22 '5\' 1"'$  (1.549 m)  ? ? ?Weight:  ? ?Wt Readings from Last 1 Encounters:  ?01/31/22 91.2 kg  ? ? ?Ideal Body Weight:  47.7 kg ? ?BMI:  Body mass index is 37.98 kg/m?. ? ?Estimated Nutritional Needs:  ? ?Kcal:  1400-1600 ? ?Protein:  70-85g ? ?Fluid:  >/=1.5 ? ?Clayborne Dana, RDN, LDN ?Clinical Nutrition ?

## 2022-01-31 NOTE — Progress Notes (Signed)
? ?CHIEF COMPLAINTS/PURPOSE OF CONSULTATION:  ?Liver mass/biliary ? ?Marcia Livingston 76 y.o.  female currently admitted to hospital for nausea jaundice.  Noted to have liver masses; elevated liver function. ? ?Patient continues to have abdominal discomfort nausea.  Appetite is poor.  Currently n.p.o. awaiting ERCP in the morning ? ? ?MEDICAL HISTORY:  ?Past Medical History:  ?Diagnosis Date  ? Anxiety   ? Arthritis   ? CAD (coronary artery disease)   ? Cancer [facility])   ? SKIN  ? Colon polyps   ? COPD (chronic obstructive pulmonary disease) ([place])   ? Depression   ? Depression   ? Fibrocystic breast disease   ? GERD (gastroesophageal reflux disease)   ? Heart murmur   ? High cholesterol   ? Hyperlipidemia   ? Hypertension   ? IBS (irritable bowel syndrome)   ? IBS (irritable bowel syndrome)   ? Obesity   ? Osteoporosis   ? Overactive bladder   ? Sleep apnea   ? Sleep apnea   ? ? ?SURGICAL HISTORY: ?Past Surgical History:  ?Procedure Laterality Date  ? ABDOMINAL HYSTERECTOMY    ? total  ? APPENDECTOMY    ? BREAST EXCISIONAL BIOPSY Right YRS AGO  ? NEG  ? CATARACT EXTRACTION W/PHACO Right 02/01/2016  ? Procedure: CATARACT EXTRACTION PHACO AND INTRAOCULAR LENS PLACEMENT (IOC);  Surgeon: Birder Robson, MD;  Location: [facility] ORS;  Service: Ophthalmology;  Laterality: Right;  [place]    00:37.2 ?AP%  20.7% ?CDE  7.69 ?fluid pack lot # [id number]  ? CATARACT EXTRACTION W/PHACO Left 02/22/2016  ? Procedure: CATARACT EXTRACTION PHACO AND INTRAOCULAR LENS PLACEMENT (IOC);  Surgeon: Birder Robson, MD;  Location: [facility] ORS;  Service: Ophthalmology;  Laterality: Left;  [place] 00:40 ?AP% 19.9 ?CDE 8.06 ?fluid pack lot # [id number]  ? CHOLECYSTECTOMY    ? COLONOSCOPY WITH PROPOFOL N/A 01/13/2016  ? Procedure: COLONOSCOPY WITH PROPOFOL;  Surgeon: Manya Silvas, MD;  Location: [facility] ENDOSCOPY;  Service: Endoscopy;  Laterality: N/A;  ? COLONOSCOPY WITH PROPOFOL N/A 12/14/2021  ? Procedure: COLONOSCOPY WITH PROPOFOL;  Surgeon: Toledo, Benay Pike, MD;   Location: [facility] ENDOSCOPY;  Service: Gastroenterology;  Laterality: N/A;  ? FOOT SURGERY Left   ? for breakdown of arch  ? OVARIAN CYST SURGERY    ? 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 Right [place]  ? ? ?SOCIAL HISTORY: ?Social History  ? ?Socioeconomic History  ? Marital status: Married  ?  Spouse name: Orpah Cobb. Terrien  ? Number of children: 2  ? Years of education: 34  ? Highest education level: High school graduate  ?Occupational History  ? 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 Smoking status: Former  ?  Packs/day: 1.00  ?  Years: 30.00  ?  Pack years: 30.00  ?  Types: Cigarettes  ?  Quit date: 10/15/1996  ?  Years since quitting: 25.3  ? Smokeless tobacco: Never  ?Vaping Use  ? Vaping Use: Never used  ?Substance and Sexual Activity  ? Alcohol use: Yes  ?  Comment: wine rarely  ? Drug use: No  ? Sexual activity: Not Currently  ?  Partners: Male  ?  Birth control/protection: Surgical  ?Other Topics Concern  ? Not on file  ?Social History Narrative  ? Pt states she has raised her granddaughter, and considers the granddaughter her child.  ? ?Social Determinants of Health  ? ?Financial Resource Strain: Not on file  ?Food Insecurity: Not on file  ?  Transportation Needs: Not on file  ?Physical Activity: Not on file  ?Stress: Not on file  ?Social Connections: Not on file  ?Intimate Partner Violence: Not on file  ? ? ?FAMILY HISTORY: ?Family History  ?Problem Relation Age of Onset  ? Colon cancer Mother   ?     thought to be mets from breast cancer  ? Stroke Mother   ? Hypertension Mother   ? Heart attack Mother   ? Breast cancer Mother 19  ? Emphysema Father   ? Prostate cancer Father   ? Throat cancer Daughter   ? Breast cancer Maternal Aunt   ?     10's  ? Rheum arthritis Daughter   ? Hypertension Daughter   ? Reye's syndrome Daughter   ?     history of  ? Hepatitis C Daughter   ? Ovarian cancer Neg Hx   ? ? ?ALLERGIES:  is allergic to penicillins, codeine, and sulfa antibiotics. ? ?MEDICATIONS:   ?Current Facility-Administered Medications  ?Medication Dose Route Frequency Provider Last Rate Last Admin  ? acetaminophen (TYLENOL) tablet 650 mg  650 mg Oral Q6H PRN Athena Masse, MD   650 mg at 01/29/22 1523  ? Or  ? acetaminophen (TYLENOL) suppository 650 mg  650 mg Rectal Q6H PRN Athena Masse, MD      ? ALPRAZolam Duanne Moron) tablet 0.5 mg  0.5 mg Oral BID PRN Enzo Bi, MD   0.5 mg at 01/31/22 0853  ? alum & mag hydroxide-simeth (MAALOX/MYLANTA) 200-200-20 MG/5ML suspension 30 mL  30 mL Oral Q6H PRN Enzo Bi, MD   30 mL at 01/30/22 2144  ? buPROPion (WELLBUTRIN XL) 24 hr tablet 150 mg  150 mg Oral Daily Sharion Settler, NP   150 mg at 01/29/22 1423  ? camphor-menthol (SARNA) lotion   Topical PRN Sharion Settler, NP   Given at 01/28/22 2240  ? diphenhydrAMINE (BENADRYL) capsule 25 mg  25 mg Oral Q6H PRN Enzo Bi, MD   25 mg at 01/31/22 1434  ? DULoxetine (CYMBALTA) DR capsule 60 mg  60 mg Oral Daily Sharion Settler, NP   60 mg at 01/29/22 9532  ? feeding supplement (BOOST / RESOURCE BREEZE) liquid 1 Container  1 Container Oral TID BM Enzo Bi, MD      ? fentaNYL (SUBLIMAZE) 50 MCG/ML injection           ? fluticasone furoate-vilanterol (BREO ELLIPTA) 100-25 MCG/ACT 1 puff  1 puff Inhalation Daily Sharion Settler, NP      ? And  ? umeclidinium bromide (INCRUSE ELLIPTA) 62.5 MCG/ACT 1 puff  1 puff Inhalation Daily Sharion Settler, NP      ? gabapentin (NEURONTIN) capsule 300 mg  300 mg Oral QHS Enzo Bi, MD   300 mg at 01/30/22 2115  ? hydrochlorothiazide (HYDRODIURIL) tablet 25 mg  25 mg Oral Daily Enzo Bi, MD   25 mg at 01/31/22 0845  ? indomethacin (INDOCIN) 50 MG suppository           ? LORazepam (ATIVAN) injection 0.5 mg  0.5 mg Intravenous Once PRN Athena Masse, MD      ? losartan (COZAAR) tablet 50 mg  50 mg Oral Daily Sharion Settler, NP   50 mg at 01/31/22 0845  ? multivitamin with minerals tablet 1 tablet  1 tablet Oral Daily Enzo Bi, MD   1 tablet at 01/29/22 1218  ? ondansetron  (ZOFRAN) tablet 4 mg  4 mg Oral Q6H PRN Athena Masse,  MD      ? Or  ? ondansetron (ZOFRAN) injection 4 mg  4 mg Intravenous Q6H PRN Athena Masse, MD      ? oxyCODONE (Oxy IR/ROXICODONE) immediate release tablet 5-10 mg  5-10 mg Oral Q4H PRN Enzo Bi, MD   10 mg at 01/31/22 1828  ? pantoprazole (PROTONIX) EC tablet 40 mg  40 mg Oral Daily Enzo Bi, MD   40 mg at 01/31/22 1402  ? phenol (CHLORASEPTIC) mouth spray 1 spray  1 spray Mouth/Throat PRN Enzo Bi, MD      ? piperacillin-tazobactam (ZOSYN) IVPB 3.375 g  3.375 g Intravenous Q8H Enzo Bi, MD 12.5 mL/hr at 01/31/22 1401 3.375 g at 01/31/22 1401  ? rosuvastatin (CRESTOR) tablet 5 mg  5 mg Oral Once per day on Mon Wed Fri Sharion Settler, NP      ? traZODone (DESYREL) tablet 50 mg  50 mg Oral QHS PRN Sharion Settler, NP   50 mg at 01/29/22 2147  ? ? ?  ?. ? ?PHYSICAL EXAMINATION: ? ?Vitals:  ? 01/31/22 1557 01/31/22 2006  ?BP: (!) 174/56 (!) 147/54  ?Pulse: (!) 58 65  ?Resp: 16 19  ?Temp: 98.7 ?F (37.1 ?C) 98.1 ?F (36.7 ?C)  ?SpO2: 94% 94%  ? ?Filed Weights  ? 01/27/22 1730 01/31/22 1139  ?Weight: 201 lb (91.2 kg) 201 lb (91.2 kg)  ? ? ?Physical Exam ?Vitals and nursing note reviewed.  ?HENT:  ?   Head: Normocephalic and atraumatic.  ?   Mouth/Throat:  ?   Pharynx: Oropharynx is clear.  ?Eyes:  ?   Extraocular Movements: Extraocular movements intact.  ?   Pupils: Pupils are equal, round, and reactive to light.  ?Cardiovascular:  ?   Rate and Rhythm: Normal rate and regular rhythm.  ?Pulmonary:  ?   Comments: Decreased breath sounds bilaterally.  ?Abdominal:  ?   Palpations: Abdomen is soft.  ?Musculoskeletal:     ?   General: Normal range of motion.  ?   Cervical back: Normal range of motion.  ?Skin: ?   General: Skin is warm.  ?Neurological:  ?   General: No focal deficit present.  ?   Mental Status: She is alert and oriented to person, place, and time.  ?Psychiatric:     ?   Behavior: Behavior normal.     ?   Judgment: Judgment normal.   ? ? ? ?LABORATORY DATA:  ?I have reviewed the data as listed ?Lab Results  ?Component Value Date  ? WBC 7.3 01/31/2022  ? HGB 12.9 01/31/2022  ? HCT 41.2 01/31/2022  ? MCV 92.8 01/31/2022  ? PLT 230 01/31/2022  ? ?Recent

## 2022-01-31 NOTE — Anesthesia Postprocedure Evaluation (Signed)
Anesthesia Post Note ? ?Patient: Marcia Livingston ? ?Procedure(s) Performed: ENDOSCOPIC RETROGRADE CHOLANGIOPANCREATOGRAPHY (ERCP) ? ?Patient location during evaluation: PACU ?Anesthesia Type: General ?Level of consciousness: awake and alert, oriented and patient cooperative ?Pain management: pain level controlled ?Vital Signs Assessment: post-procedure vital signs reviewed and stable ?Respiratory status: spontaneous breathing, nonlabored ventilation and respiratory function stable ?Cardiovascular status: blood pressure returned to baseline and stable ?Postop Assessment: adequate PO intake ?Anesthetic complications: no ? ? ?No notable events documented. ? ? ?Last Vitals:  ?Vitals:  ? 01/31/22 1139 01/31/22 1252  ?BP: (!) 186/107 (!) 154/72  ?Pulse: 65 73  ?Resp: 16 14  ?Temp: (!) 36.2 ?C   ?SpO2: 96% 100%  ?  ?Last Pain:  ?Vitals:  ? 01/31/22 1252  ?TempSrc:   ?PainSc: 0-No pain  ? ? ?  ?  ?  ?  ?  ?  ? ?Darrin Nipper ? ? ? ? ?

## 2022-01-31 NOTE — Care Management Important Message (Signed)
Important Message ? ?Patient Details  ?Name: Marcia Livingston ?MRN: [id number] ?Date of Birth: Jun 29, 1946 ? ? ?Medicare Important Message Given:  Yes ? ? ? ? ?[name] ?01/31/2022, 2:44 PM ?

## 2022-02-01 ENCOUNTER — Encounter: Payer: Self-pay | Admitting: Gastroenterology

## 2022-02-01 DIAGNOSIS — C221 Intrahepatic bile duct carcinoma: Secondary | ICD-10-CM | POA: Diagnosis not present

## 2022-02-01 LAB — COMPREHENSIVE METABOLIC PANEL
ALT: 99 U/L — ABNORMAL HIGH (ref 0–44)
AST: 60 U/L — ABNORMAL HIGH (ref 15–41)
Albumin: 3.4 g/dL — ABNORMAL LOW (ref 3.5–5.0)
Alkaline Phosphatase: 285 U/L — ABNORMAL HIGH (ref 38–126)
Anion gap: 7 (ref 5–15)
BUN: 13 mg/dL (ref 8–23)
CO2: 26 mmol/L (ref 22–32)
Calcium: 9.1 mg/dL (ref 8.9–10.3)
Chloride: 102 mmol/L (ref 98–111)
Creatinine, Ser: 0.77 mg/dL (ref 0.44–1.00)
GFR, Estimated: >60 mL/min (ref >60)
Glucose, Bld: 125 mg/dL — ABNORMAL HIGH (ref 70–99)
Potassium: 3.5 mmol/L (ref 3.5–5.1)
Sodium: 135 mmol/L (ref 135–145)
Total Bilirubin: 3.7 mg/dL — ABNORMAL HIGH (ref 0.3–1.2)
Total Protein: 7.5 g/dL (ref 6.5–8.1)

## 2022-02-01 LAB — CBC
HCT: 43.8 % (ref 36.0–46.0)
Hemoglobin: 13.9 g/dL (ref 12.0–15.0)
MCH: 29.4 pg (ref 26.0–34.0)
MCHC: 31.7 g/dL (ref 30.0–36.0)
MCV: 92.8 fL (ref 80.0–100.0)
Platelets: 317 10*3/uL (ref 150–400)
RBC: 4.72 MIL/uL (ref 3.87–5.11)
RDW: 13 % (ref 11.5–15.5)
WBC: 13 10*3/uL — ABNORMAL HIGH (ref 4.0–10.5)
nRBC: 0 % (ref 0.0–0.2)

## 2022-02-01 LAB — LIPASE, BLOOD: Lipase: 3022 U/L — ABNORMAL HIGH (ref 11–51)

## 2022-02-01 LAB — MAGNESIUM: Magnesium: 2 mg/dL (ref 1.7–2.4)

## 2022-02-01 MED ORDER — LACTATED RINGERS IV SOLN: INTRAVENOUS | Status: DC

## 2022-02-01 MED ORDER — DICYCLOMINE HCL 20 MG PO TABS
20.0000 mg | ORAL_TABLET | Freq: Three times a day (TID) | ORAL | Status: DC
  Administered 2022-02-01: 20 mg via ORAL
  Filled 2022-02-01 (×2): qty 1

## 2022-02-01 MED ORDER — GABAPENTIN 300 MG PO CAPS
300.0000 mg | ORAL_CAPSULE | Freq: Three times a day (TID) | ORAL | Status: DC
Start: 2022-02-01 — End: 2022-02-03
  Administered 2022-02-01 – 2022-02-03 (×7): 300 mg via ORAL
  Filled 2022-02-01 (×7): qty 1

## 2022-02-01 MED ORDER — HYDROMORPHONE HCL 1 MG/ML IJ SOLN
0.5000 mg | INTRAMUSCULAR | Status: DC | PRN
  Administered 2022-02-01: 0.5 mg via INTRAVENOUS
  Filled 2022-02-01: qty 0.5

## 2022-02-01 MED ORDER — SODIUM CHLORIDE 0.9 % IV SOLN: INTRAVENOUS | Status: DC

## 2022-02-01 NOTE — TOC Initial Note (Signed)
Transition of Care (TOC) - Initial/Assessment Note  ? ? ?Patient Details  ?Name: Marcia Livingston ?MRN: [id number] ?Date of Birth: 1946-09-05 ? ?Transition of Care (TOC) CM/SW Contact:    ?Beverly Sessions, RN ?Phone Number: ?02/01/2022, 1:58 PM ? ?Clinical Narrative:                 ? ?Transition of Care (TOC) Screening Note ? ? ?Patient Details  ?Name: Marcia Livingston ?Date of Birth: 10-04-1946 ? ? ?Transition of Care (TOC) CM/SW Contact:    ?Beverly Sessions, RN ?Phone Number: ?02/01/2022, 1:58 PM ? ? ? ?Transition of Care Department [facility]) has reviewed patient and no TOC needs have been identified at this time. We will continue to monitor patient advancement through interdisciplinary progression rounds. If new patient transition needs arise, please place a TOC consult. ? ? ?  ?  ? ? ?Patient Goals and CMS Choice ?  ?  ?  ? ?Expected Discharge Plan and Services ?  ?  ?  ?  ?  ?                ?  ?  ?  ?  ?  ?  ?  ?  ?  ?  ? ?Prior Living Arrangements/Services ?  ?  ?  ?       ?  ?  ?  ?  ? ?Activities of Daily Living ?Home Assistive Devices/Equipment: Cane (specify quad or straight), Reacher, Built-in shower seat, Eyeglasses, Raised toilet seat with rails ?ADL Screening (condition at time of admission) ?Patient's cognitive ability adequate to safely complete daily activities?: Yes ?Is the patient deaf or have difficulty hearing?: No ?Does the patient have difficulty seeing, even when wearing glasses/contacts?: No ?Does the patient have difficulty concentrating, remembering, or making decisions?: No ?Patient able to express need for assistance with ADLs?: Yes ?Does the patient have difficulty dressing or bathing?: No ?Independently performs ADLs?: Yes (appropriate for developmental age) ?Does the patient have difficulty walking or climbing stairs?: No (Only difficulty when going down stairs) ?Weakness of Legs: None ?Weakness of Arms/Hands: None ? ?Permission Sought/Granted ?  ?  ?   ?   ?   ?   ? ?Emotional Assessment ?  ?  ?  ?   ?  ?  ? 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 Diagnosis Date Noted  ? Pancreatic mass   ? 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 Common bile duct obstruction 01/29/2022  ? Nausea and vomiting 01/28/2022  ? COPD (chronic obstructive pulmonary disease) ([place])   ? Skin cancer 01/30/2020  ? Morbid obesity ([place]) 08/15/2019  ? Prediabetes 02/14/2019  ? Allergic rhinitis 07/04/2018  ? OSA (obstructive sleep apnea) 01/16/2018  ? GERD (gastroesophageal reflux disease) 01/16/2018  ? Insomnia 01/16/2018  ? Mild aortic stenosis 01/16/2018  ? Angina pectoris ([place]) 10/22/2015  ? HTN (hypertension) 10/22/2015  ? High cholesterol 10/22/2015  ? Anxiety 10/22/2015  ? Depression 10/22/2015  ? CAD (coronary artery disease) 10/22/2015  ? H/O cardiac catheterization 06/02/2014  ? ?PCP:  Baxter Hire, MD ?Pharmacy:   ?[facility] [place] ?[address] ?[address] ?[place] [address] ?Phone: [phone] Fax: [phone] ? ?[id number] - [place], [place] [address] ?[place] [address] ?Phone: [phone] Fax: [phone] ? ? ? ? ?Social Determinants of Health (SDOH) Interventions ?  ? ?Readmission Risk Interventions ?   ? View : No data to display.  ?  ?  ?  ? ? ? ?

## 2022-02-01 NOTE — Progress Notes (Signed)
? ?GI Inpatient Follow-up Note ? ?Subjective: ? ?Patient seen in follow-up for metastatic cholangiocarcinoma s/p ERCP yesterday with Dr. Allen Norris for sphincterotomy and placement of one metal stent into the CBD. She reports she developed severe abdominal pain, mainly in RUQ, last night around 6-7 PM. There was no fevers, nausea, vomiting, or altered mental status. No acute events overnight. She had one BM last night which was small volume and without any hematochezia or melena. Pain level currently 4/10 in severity and reports pain mainly in RUQ with radiation to her back. She has needed oral oxycodone every 4 hours for her pain today. She had elevated WBC 13K this morning and lipase was also >3000 this afternoon. Daughter present at bedside.  ? ?Scheduled Inpatient Medications:  ? buPROPion  150 mg Oral Daily  ? dicyclomine  20 mg Oral TID AC  ? DULoxetine  60 mg Oral Daily  ? feeding supplement  1 Container Oral TID BM  ? fluticasone furoate-vilanterol  1 puff Inhalation Daily  ? And  ? umeclidinium bromide  1 puff Inhalation Daily  ? gabapentin  300 mg Oral TID  ? hydrochlorothiazide  25 mg Oral Daily  ? losartan  50 mg Oral Daily  ? multivitamin with minerals  1 tablet Oral Daily  ? pantoprazole  40 mg Oral Daily  ? rosuvastatin  5 mg Oral Once per day on Mon Wed Fri  ? ? ?Continuous Inpatient Infusions: ?  ? lactated ringers    ? piperacillin-tazobactam (ZOSYN)  IV 3.375 g (02/01/22 1328)  ? ? ?PRN Inpatient Medications:  ?acetaminophen **OR** acetaminophen, ALPRAZolam, alum & mag hydroxide-simeth, camphor-menthol, diphenhydrAMINE, HYDROmorphone (DILAUDID) injection, LORazepam, ondansetron **OR** ondansetron (ZOFRAN) IV, oxyCODONE, phenol, traZODone ? ?Review of Systems: ?Constitutional: +weight loss, + loss of appetite  ?Eyes: No changes in vision. ?ENT: No oral lesions, sore throat.  ?GI: see HPI.  ?Heme/Lymph: No easy bruising.  ?CV: No chest pain.  ?GU: No hematuria.  ?Integumentary: No rashes.  ?Neuro: No  headaches.  ?Psych: No depression/anxiety.  ?Endocrine: No heat/cold intolerance.  ?Allergic/Immunologic: No urticaria.  ?Resp: No cough, SOB.  ?Musculoskeletal: No joint swelling.  ?  ?Physical Examination: ?BP (!) 128/57 (BP Location: Right Arm)   Pulse 73   Temp 98.7 ?F (37.1 ?C) (Oral)   Resp 18   Ht '5\' 1"'$  (1.549 m)   Wt 91.2 kg   SpO2 92%   BMI 37.98 kg/m?  ?Gen: NAD, alert and oriented x 4 ?HEENT: PEERLA, EOMI, ?Neck: supple, no JVD or thyromegaly ?Chest: CTA bilaterally, no wheezes, crackles, or other adventitious sounds ?CV: RRR, no m/g/c/r ?Abd: soft, nondistended, bowel sounds present in all four quadrants, tenderness to deep palpation in RUQ, no HSM, guarding, ridigity, or rebound tenderness ?Ext: no edema, well perfused with 2+ pulses, ?Skin: no rash or lesions noted ?Lymph: no LAD ? ?Data: ?Lab Results  ?Component Value Date  ? WBC 13.0 (H) 02/01/2022  ? HGB 13.9 02/01/2022  ? HCT 43.8 02/01/2022  ? MCV 92.8 02/01/2022  ? PLT 317 02/01/2022  ? ?Recent Labs  ?Lab 01/30/22 ?0323 01/31/22 ?0448 02/01/22 ?4270  ?HGB 12.0 12.9 13.9  ? ?Lab Results  ?Component Value Date  ? NA 135 02/01/2022  ? K 3.5 02/01/2022  ? CL 102 02/01/2022  ? CO2 26 02/01/2022  ? BUN 13 02/01/2022  ? CREATININE 0.77 02/01/2022  ? ?Lab Results  ?Component Value Date  ? ALT 99 (H) 02/01/2022  ? AST 60 (H) 02/01/2022  ? ALKPHOS 285 (H)  02/01/2022  ? BILITOT 3.7 (H) 02/01/2022  ? ?No results for input(s): APTT, INR, PTT in the last 168 hours. ? ?ERCP 01/31/2022 (Dr. Lucilla Lame): ?- A single segmental biliary stricture was found in the middle third of the main bile duct. The stricture was malignant appearing. ?- The upper third of the main bile duct was dilated. ?- A biliary sphincterotomy was performed. ?- One covered metal stent was placed into the common bile duct. ? ?Assessment/Plan: ? 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 She is s/p ERCP yesterday afternoon with Dr. Allen Norris with biliary sphincterotomy and metal stent placement.  ? ?Post-ERCP pancreatitis - clinical presentation today c/w post-ERCP pancreatitis with mild leukocytosis (13K) and elevated lipase >3000 with RUQ and epigastric abdominal pain.  ? ?Metastatic cholangiocarcinoma - Oncology closely following ? ?Cholestatic transaminitis - bilirubin trending down after stenting of CBD  ? ?Nausea and vomiting - stable currently ? ?Recommendations: ? ?- Discussed diagnosis of post-ERCP pancreatitis with patient and daughter in room today and reviewed ERCP procedure report ?- Full liquid diet ?- Aggressive IV fluid hydration with LR 250 cc/hour for 2 bags then reduce to 125 cc/hour continuously  ?- Continue supportive care with schedule IV pain meds q4 hours and antiemetics prn ?- Continue serial abdominal examinations ?- Appreciate oncology assistance and recommendations ?- She will need outpatient EUS ?- Follow daily CBC and LFTs ?- Following along with you  ? ?Please call with questions or concerns. ? ? ? ?Octavia Bruckner, PA-C ?[facility] ?[phone] ?[phone] (Cell) ? ? ? ?

## 2022-02-01 NOTE — Progress Notes (Signed)
?PROGRESS NOTE ? ? ? ?Marcia Livingston  ALP:[id number] DOB: 1946-02-17 DOA: 01/27/2022 ?PCP: Baxter Hire, MD  ? ? ?Brief Narrative:  ?76 y.o. female with medical history significant for OSA on CPAP, IBS, DM, COPD, anxiety and depression, colon polyps with recent colonoscopy on 3/1, who was sent by her PCP for evaluation of elevated LFTs.  Patient was seen by her PCP on 4/11 with a 1 week complaint of nausea and vomiting with decreased oral intake, generalized weakness.  On her follow-up on 4/14, she had ongoing symptoms and blood work revealed elevated bilirubin of 3.1.  During this time patient developed an orange color to her urine and she was noted to be jaundiced and was sent in for evaluation. ? ?Patient has had extensive in-hospital work-up.  Investigation revealed biliary mass.  Suspicious for metastatic cholangiocarcinoma.  Guarded prognosis.  Oncology consulted.  Patient will need endoscopic ultrasound for biopsy however any treatment would be likely palliative in nature.  Patient underwent ERCP with bare-metal stent placement on 4/18 ? ?Post ERCP patient developed right upper quadrant pain associated with increased lipase to 3000.  Discussed with GI.  Suspect post ERCP pancreatitis.  Initiate high rate maintenance fluids and pain control ? ? ?Assessment & Plan: ?  ?Principal Problem: ?  Cholangiocarcinoma ([place]) ?Active Problems: ?  Nausea and vomiting ?  CAD (coronary artery disease) ?  HTN (hypertension) ?  Depression ?  OSA (obstructive sleep apnea) ?  COPD (chronic obstructive pulmonary disease) ([place]) ?  Common bile duct obstruction ?  Pancreatic mass ? ?Cholangiocarcinoma ([place]) ?--MRCP showed findings of infiltrative cholangiocarcinoma with significant lymphadenopathy ?--IR determined no safe window for CT-guided biopsy ?-- ERCP on 4/18, bare-metal stent placed ?Plan: ?--per GI, will need outpatient endoscopic ultrasound for biopsy ?--Oncology following and consulted in house ?--Any treatment would likely  be palliative in nature ? ?Post ERCP pancreatitis ?Lipase 3000 on 4/19 associated with leukocytosis and right upper quadrant pain ?Plan: ?Lactated Ringer's 250 cc/h x 2 L followed by 125 cc/h ?Pain control ?Full liquid diet ?  ?Nausea and vomiting ?Secondary to suspected malignancy with obstructive jaundice ?--improved, able to tolerate oral hydration ?  ?CAD (coronary artery disease) ?No complaints of chest pain ?Continue rosuvastatin ?  ?Common bile duct obstruction ?--ECRP 4/18.  A biliary sphincterotomy was performed and One covered metal stent was placed into the common bile duct. ?-We will post ERCP pancreatitis ?--Lipase elevated, LFTs and bilirubin downtrending ?  ?COPD (chronic obstructive pulmonary disease) ([place]) ?Not acutely exacerbated ?Continue Trelegy Ellipta with DuoNebs as needed ?  ?OSA (obstructive sleep apnea) ?CPAP nightly if desired ?  ?Depression ?Continue Wellbutrin and Cymbalta and trazodone ?  ?HTN (hypertension) ?Continue losartan and hydrochlorothiazide ? ? ? ?DVT prophylaxis: SCDs ?Code Status: Full ?Family Communication: Daughter at bedside 4/19 ?Disposition Plan: Status is: Inpatient ?Remains inpatient appropriate because: Post ERCP pancreatitis.  On IV fluids and IV pain control.  Possible discharge in 24 to 48 hours with resolution of serum lipase. ? ? ?Level of care: Med-Surg ? ?Consultants:  ?GI ?Oncology ? ?Procedures:  ?ERCP 4/18 ? ?Antimicrobials: ?Zosyn ? ? ?Subjective: ?Seen and examined.  Endorses right upper quadrant pain with radiation up into the scapula. ? ?Objective: ?Vitals:  ? 01/31/22 1331 01/31/22 1557 01/31/22 2006 02/01/22 0811  ?BP: (!) 145/63 (!) 174/56 (!) 147/54 (!) 128/57  ?Pulse:  (!) 58 65 73  ?Resp:  '16 19 18  '$ ?Temp:  98.7 ?F (37.1 ?C) 98.1 ?F (36.7 ?C) 98.7 ?F (37.1 ?  C)  ?TempSrc:  Oral  Oral  ?SpO2:  94% 94% 92%  ?Weight:      ?Height:      ? ? ?Intake/Output Summary (Last 24 hours) at 02/01/2022 1521 ?Last data filed at 02/01/2022 1411 ?Gross per 24 hour   ?Intake 480 ml  ?Output --  ?Net 480 ml  ? ?Filed Weights  ? 01/27/22 1730 01/31/22 1139  ?Weight: 91.2 kg 91.2 kg  ? ? ?Examination: ? ?General exam: Appears calm and comfortable  ?Respiratory system: Clear to auscultation. Respiratory effort normal. ?Cardiovascular system: S1-S2, RRR, no murmurs, no pedal edema ?Gastrointestinal system: Obese, soft, tender to palpation right upper quadrant, normal bowel sounds ?Central nervous system: Alert and oriented. No focal neurological deficits. ?Extremities: Symmetric 5 x 5 power. ?Skin: No rashes, lesions or ulcers ?Psychiatry: Judgement and insight appear normal. Mood & affect appropriate.  ? ? ? ?Data Reviewed: I have personally reviewed following labs and imaging studies ? ?CBC: ?Recent Labs  ?Lab 01/28/22 ?4008 01/29/22 ?6761 01/30/22 ?0323 01/31/22 ?0448 02/01/22 ?0444  ?WBC 7.6 7.2 6.0 7.3 13.0*  ?HGB 13.4 13.0 12.0 12.9 13.9  ?HCT 41.4 40.1 37.9 41.2 43.8  ?MCV 91.4 92.8 93.1 92.8 92.8  ?PLT 241 229 192 230 317  ? ?Basic Metabolic Panel: ?Recent Labs  ?Lab 01/27/22 ?2202 01/28/22 ?9509 01/29/22 ?3267 01/30/22 ?1245 01/31/22 ?0448 02/01/22 ?0444  ?NA  --  137 141 140 139 135  ?K  --  3.5 3.3* 3.7 3.8 3.5  ?CL  --  99 104 105 104 102  ?CO2  --  '29 28 28 28 26  '$ ?GLUCOSE  --  102* 119* 116* 117* 125*  ?BUN  --  '13 12 9 10 13  '$ ?CREATININE  --  0.84 0.87 0.86 0.73 0.77  ?CALCIUM  --  9.5 8.9 8.8* 9.4 9.1  ?MG 2.1  --  2.1 2.2 2.2 2.0  ? ?GFR: ?Estimated Creatinine Clearance: 60.6 mL/min (by C-G formula based on SCr of 0.77 mg/dL). ?Liver Function Tests: ?Recent Labs  ?Lab 01/28/22 ?8099 01/29/22 ?8338 01/30/22 ?2505 01/31/22 ?0448 02/01/22 ?0444  ?AST 78* 87* 89* 87* 60*  ?ALT 111* 119* 119* 126* 99*  ?ALKPHOS 266* 255* 233* 283* 285*  ?BILITOT 3.5* 3.3* 3.8* 4.9* 3.7*  ?PROT 7.5 7.2 6.7 7.5 7.5  ?ALBUMIN 3.6 3.3* 3.2* 3.5 3.4*  ? ?Recent Labs  ?Lab 02/01/22 ?0820  ?LIPASE 3,022*  ? ?No results for input(s): AMMONIA in the last 168 hours. ?Coagulation Profile: ?No  results for input(s): INR, PROTIME in the last 168 hours. ?Cardiac Enzymes: ?No results for input(s): CKTOTAL, CKMB, CKMBINDEX, TROPONINI in the last 168 hours. ?BNP (last 3 results) ?No results for input(s): PROBNP in the last 8760 hours. ?HbA1C: ?No results for input(s): HGBA1C in the last 72 hours. ?CBG: ?No results for input(s): GLUCAP in the last 168 hours. ?Lipid Profile: ?No results for input(s): CHOL, HDL, LDLCALC, TRIG, CHOLHDL, LDLDIRECT in the last 72 hours. ?Thyroid Function Tests: ?No results for input(s): TSH, T4TOTAL, FREET4, T3FREE, THYROIDAB in the last 72 hours. ?Anemia Panel: ?No results for input(s): VITAMINB12, FOLATE, FERRITIN, TIBC, IRON, RETICCTPCT in the last 72 hours. ?Sepsis Labs: ?No results for input(s): PROCALCITON, LATICACIDVEN in the last 168 hours. ? ?Recent Results (from the past 240 hour(s))  ?Urine Culture     Status: None  ? Collection Time: 01/27/22 10:04 PM  ? Specimen: Urine, Clean Catch  ?Result Value Ref Range Status  ? Specimen Description   Final  ?  URINE,  CLEAN CATCH ?Performed at [facility], [address] [place], [place] [address] ?  ? Special Requests   Final  ?  NONE ?Performed at [facility], [address] [place], [place] [address] ?  ? Culture   Final  ?  NO GROWTH ?Performed at [facility], [place] [address], [place], [place] [address] ?  ? Report Status 01/29/2022 FINAL  Final  ?Blood culture (routine x 2)     Status: None (Preliminary result)  ? Collection Time: 01/28/22 12:56 AM  ? Specimen: BLOOD  ?Result Value Ref Range Status  ? Specimen Description BLOOD BLOOD RIGHT FOREARM  Final  ? Special Requests   Final  ?  BOTTLES DRAWN AEROBIC AND ANAEROBIC Blood Culture results may not be optimal due to an inadequate volume of blood received in culture bottles  ? Culture   Final  ?  NO GROWTH 4 DAYS ?Performed at [facility], [address] [place],  [address] ?  ? Report Status PENDING  Incomplete  ?Blood culture  (routine x 2)     Status: None (Preliminary result)  ? Collection Time: 01/28/22 12:56 AM  ? Specimen: BLOOD  ?Result Value Ref Range Status  ? Specimen Description BLOOD RIGHT ANTECUBITAL  Final  ? Special

## 2022-02-01 NOTE — Progress Notes (Signed)
Marcia Livingston   DOB:1946-09-26   MR#:[id number]   ? ?Subjective: Status post ERCP on  4/18. patient resting in the bed.  Patient continues to complain of intermittent abdominal pain.  However family states intermittently drowsy.  No fevers or chills. ? ?Objective:  ?Vitals:  ? 01/31/22 2006 02/01/22 0811  ?BP: (!) 147/54 (!) 128/57  ?Pulse: 65 73  ?Resp: 19 18  ?Temp: 98.1 ?F (36.7 ?C) 98.7 ?F (37.1 ?C)  ?SpO2: 94% 92%  ?  ? ?Intake/Output Summary (Last 24 hours) at 02/01/2022 0820 ?Last data filed at 01/31/2022 1251 ?Gross per 24 hour  ?Intake 500 ml  ?Output --  ?Net 500 ml  ? ? ?Physical Exam ?Vitals and nursing note reviewed.  ?HENT:  ?   Head: Normocephalic and atraumatic.  ?Cardiovascular:  ?   Rate and Rhythm: Regular rhythm.  ?Pulmonary:  ?   Comments: Decreased breath sounds bilaterally.  Coarse breath sounds ?Abdominal:  ?   General: Abdomen is flat.  ?   Palpations: Abdomen is soft.  ?Skin: ?   General: Skin is warm and dry.  ? ?  ?Labs:  ?Lab Results  ?Component Value Date  ? WBC 13.0 (H) 02/01/2022  ? HGB 13.9 02/01/2022  ? HCT 43.8 02/01/2022  ? MCV 92.8 02/01/2022  ? PLT 317 02/01/2022  ? NEUTROABS 3.9 01/30/2020  ? ? ?Lab Results  ?Component Value Date  ? NA 135 02/01/2022  ? K 3.5 02/01/2022  ? CL 102 02/01/2022  ? CO2 26 02/01/2022  ? ? ?Studies:  ?DG C-Arm 1-60 Min-No Report ? ?Result Date: 01/31/2022 ?Fluoroscopy was utilized by the requesting physician.  No radiographic interpretation.   ? ?#75 year old female patient with liver masses and biliary obstruction ?  ?#Liver lesions-highly suspicious for malignancy based on imaging-clinically suggestive of cholangiocarcinoma.  CA 19-9 elevated.  CT scan chest concerning for enlarged mediastinal lymphadenopathy; concerning for metastatic disease. ?  ?#Biliary obstruction-s/p ERCP stenting  on 4/18.  Discussed with GI; no tissue diagnosis yet.  Recommend further evaluation with endoscopic ultrasound/biopsy.  Patient also need PET scan.  Will reach out to nurse  navigator regarding endoscopic ultrasound.  Discussed with family. ?  ?#Mild leukocytosis-no fevers.;  On antibiotics.  Defer to primary service for further recommendations. ? ?Cammie Sickle, MD ?02/01/2022  8:20 AM ?  ?

## 2022-02-01 NOTE — Progress Notes (Signed)
Mobility Specialist - Progress Note ? ? ? 02/01/22 1400  ?Mobility  ?Activity Refused mobility  ? ? ?Pt supine upon arrival using RA with family at bedside. Pt refuses mobility d/t pain and requests to try again tomorrow. Will attempt at another date and time. ? ?Marcia Livingston ?Mobility Specialist ?02/01/22, 2:19 PM ? ? ? ? ?

## 2022-02-02 ENCOUNTER — Inpatient Hospital Stay: Payer: Medicare Other

## 2022-02-02 DIAGNOSIS — C221 Intrahepatic bile duct carcinoma: Secondary | ICD-10-CM | POA: Diagnosis not present

## 2022-02-02 LAB — CBC
HCT: 40.9 % (ref 36.0–46.0)
Hemoglobin: 13 g/dL (ref 12.0–15.0)
MCH: 30 pg (ref 26.0–34.0)
MCHC: 31.8 g/dL (ref 30.0–36.0)
MCV: 94.2 fL (ref 80.0–100.0)
Platelets: 229 10*3/uL (ref 150–400)
RBC: 4.34 MIL/uL (ref 3.87–5.11)
RDW: 13 % (ref 11.5–15.5)
WBC: 15.3 10*3/uL — ABNORMAL HIGH (ref 4.0–10.5)
nRBC: 0 % (ref 0.0–0.2)

## 2022-02-02 LAB — COMPREHENSIVE METABOLIC PANEL
ALT: 69 U/L — ABNORMAL HIGH (ref 0–44)
AST: 51 U/L — ABNORMAL HIGH (ref 15–41)
Albumin: 3.2 g/dL — ABNORMAL LOW (ref 3.5–5.0)
Alkaline Phosphatase: 234 U/L — ABNORMAL HIGH (ref 38–126)
Anion gap: 8 (ref 5–15)
BUN: 10 mg/dL (ref 8–23)
CO2: 25 mmol/L (ref 22–32)
Calcium: 8.7 mg/dL — ABNORMAL LOW (ref 8.9–10.3)
Chloride: 98 mmol/L (ref 98–111)
Creatinine, Ser: 0.75 mg/dL (ref 0.44–1.00)
GFR, Estimated: >60 mL/min (ref >60)
Glucose, Bld: 91 mg/dL (ref 70–99)
Potassium: 4.1 mmol/L (ref 3.5–5.1)
Sodium: 131 mmol/L — ABNORMAL LOW (ref 135–145)
Total Bilirubin: 3.8 mg/dL — ABNORMAL HIGH (ref 0.3–1.2)
Total Protein: 7 g/dL (ref 6.5–8.1)

## 2022-02-02 LAB — CULTURE, BLOOD (ROUTINE X 2)
Culture: NO GROWTH
Culture: NO GROWTH
Special Requests: ADEQUATE

## 2022-02-02 LAB — MAGNESIUM: Magnesium: 2 mg/dL (ref 1.7–2.4)

## 2022-02-02 MED ORDER — ALBUTEROL SULFATE (2.5 MG/3ML) 0.083% IN NEBU: 2.5000 mg | INHALATION_SOLUTION | RESPIRATORY_TRACT | Status: DC | PRN

## 2022-02-02 NOTE — Progress Notes (Signed)
Marcia Livingston   DOB:10/03/1946   MR#:[id number]   ? ?Subjective: Status post ERCP on  4/18. patient resting in the bed.  Patient continues to complain of intermittent abdominal pain.  Family states that patient has had hallucinations confusion post narcotic pain medication yesterday.  No nausea no vomiting.  Patient tolerated liquid diet. ? ?Objective:  ?Vitals:  ? 02/02/22 0452 02/02/22 0828  ?BP: (!) 151/56 (!) 116/97  ?Pulse: 85 79  ?Resp: 18 16  ?Temp: 98 ?F (36.7 ?C) 98.1 ?F (36.7 ?C)  ?SpO2: 98% 100%  ?  ? ?Intake/Output Summary (Last 24 hours) at 02/02/2022 0837 ?Last data filed at 02/02/2022 0600 ?Gross per 24 hour  ?Intake 3493.35 ml  ?Output 400 ml  ?Net 3093.35 ml  ? ? ?Physical Exam ?Vitals and nursing note reviewed.  ?HENT:  ?   Head: Normocephalic and atraumatic.  ?Cardiovascular:  ?   Rate and Rhythm: Regular rhythm.  ?Pulmonary:  ?   Comments: Decreased breath sounds bilaterally.  Coarse breath sounds ?Abdominal:  ?   General: Abdomen is flat.  ?   Palpations: Abdomen is soft.  ?Skin: ?   General: Skin is warm and dry.  ? ?  ?Labs:  ?Lab Results  ?Component Value Date  ? WBC 15.3 (H) 02/02/2022  ? HGB 13.0 02/02/2022  ? HCT 40.9 02/02/2022  ? MCV 94.2 02/02/2022  ? PLT 229 02/02/2022  ? NEUTROABS 3.9 01/30/2020  ? ? ?Lab Results  ?Component Value Date  ? NA 131 (L) 02/02/2022  ? K 4.1 02/02/2022  ? CL 98 02/02/2022  ? CO2 25 02/02/2022  ? ? ?Studies:  ?DG C-Arm 1-60 Min-No Report ? ?Result Date: 01/31/2022 ?Fluoroscopy was utilized by the requesting physician.  No radiographic interpretation.   ? ?#76 year old female patient with liver masses and biliary obstruction ?  ?#Liver lesions-highly suspicious for malignancy based on imaging-clinically suggestive of cholangiocarcinoma.  CA 19-9 elevated.  CT scan chest concerning for enlarged mediastinal lymphadenopathy; concerning for metastatic disease. ?  ?#Biliary obstruction-s/p ERCP stenting  on 4/18.  LFTs improving bilirubin 3.9.  Discussed option of  evaluation with endoscopic ultrasound/biopsy.-At [facility] potentially faster versus locally at [facility] in 2 weeks.  Family willing to try the patient at [facility] if needed.  Patient also need PET scan.  Discussed with nurse navigator regarding endoscopic ultrasound.  Discussed with family. ?  ?#Mild leukocytosis-15/rising 1 temperature last night on Zosyn defer to primary service for further recommendations. ? ?Cammie Sickle, MD ?02/02/2022  8:37 AM ?  ?

## 2022-02-02 NOTE — Progress Notes (Signed)
?PROGRESS NOTE ? ? ? ?Marcia Livingston  FYB:[id number] DOB: 11/14/1945 DOA: 01/27/2022 ?PCP: Baxter Hire, MD  ? ? ?Brief Narrative:  ?76 y.o. female with medical history significant for OSA on CPAP, IBS, DM, COPD, anxiety and depression, colon polyps with recent colonoscopy on 3/1, who was sent by her PCP for evaluation of elevated LFTs.  Patient was seen by her PCP on 4/11 with a 1 week complaint of nausea and vomiting with decreased oral intake, generalized weakness.  On her follow-up on 4/14, she had ongoing symptoms and blood work revealed elevated bilirubin of 3.1.  During this time patient developed an orange color to her urine and she was noted to be jaundiced and was sent in for evaluation. ? ?Patient has had extensive in-hospital work-up.  Investigation revealed biliary mass.  Suspicious for metastatic cholangiocarcinoma.  Guarded prognosis.  Oncology consulted.  Patient will need endoscopic ultrasound for biopsy however any treatment would be likely palliative in nature.  Patient underwent ERCP with bare-metal stent placement on 4/18 ? ?Post ERCP patient developed right upper quadrant pain associated with increased lipase to 3000.  Discussed with GI.  Suspect post ERCP pancreatitis.  Initiate high rate maintenance fluids and pain control ? ?4/20: Pain control overall improving.  White count increasing, bilirubin stagnant.  Patient more confused today.  Likely related to narcotics.  Mild temperature 100.4 ? ? ?Assessment & Plan: ?  ?Principal Problem: ?  Cholangiocarcinoma ([place]) ?Active Problems: ?  Nausea and vomiting ?  CAD (coronary artery disease) ?  HTN (hypertension) ?  Depression ?  OSA (obstructive sleep apnea) ?  COPD (chronic obstructive pulmonary disease) ([place]) ?  Common bile duct obstruction ?  Pancreatic mass ? ?Cholangiocarcinoma ([place]) ?--MRCP showed findings of infiltrative cholangiocarcinoma with significant lymphadenopathy ?--IR determined no safe window for CT-guided biopsy ?-- ERCP on 4/18,  bare-metal stent placed ?Plan: ?--per GI, will need outpatient endoscopic ultrasound for biopsy ?--Oncology following and consulted in house ?--Any treatment would likely be palliative in nature ? ?Post ERCP pancreatitis ?Lipase 3000 on 4/19 associated with leukocytosis and right upper quadrant pain ?Clinically improving, though some delirium noted, likely due to narcotics ?Plan: ?Lactated Ringer's 125 cc/h ?Pain control ?Full liquid diet ?  ?Nausea and vomiting ?Secondary to suspected malignancy with obstructive jaundice ?--improved, able to tolerate oral hydration ?  ?CAD (coronary artery disease) ?No complaints of chest pain ?Continue rosuvastatin ?  ?Common bile duct obstruction ?--ECRP 4/18.  A biliary sphincterotomy was performed and One covered metal stent was placed into the common bile duct. ?-We will post ERCP pancreatitis ?--Lipase elevated, LFTs and bilirubin downtrending ?- Trend CMP, if LFTs or bili trend up, will repeat abd CT to assess for stent migration ?  ?COPD (chronic obstructive pulmonary disease) ([place]) ?Not acutely exacerbated ?Continue Trelegy Ellipta with DuoNebs as needed ?  ?OSA (obstructive sleep apnea) ?CPAP nightly if desired ?  ?Depression ?Continue Wellbutrin and Cymbalta and trazodone ?  ?HTN (hypertension) ?Continue losartan and hydrochlorothiazide ? ? ? ?DVT prophylaxis: SCDs ?Code Status: Full ?Family Communication: Daughter at bedside 4/19, 4/20 ?Disposition Plan: Status is: Inpatient ?Remains inpatient appropriate because: Post ERCP pancreatitis.  On IV fluids and IV pain control.  Possible discharge in 24 to 48 hours with clinical improvement ? ? ?Level of care: Med-Surg ? ?Consultants:  ?GI ?Oncology ? ?Procedures:  ?ERCP 4/18 ? ?Antimicrobials: ?Zosyn ? ? ?Subjective: ?Seen and examined.  Endorses right upper quadrant pain with radiation up into the scapula.  Improving over interval ? ?  Objective: ?Vitals:  ? 02/01/22 2116 02/01/22 2303 02/02/22 0452 02/02/22 0828  ?BP: 92/64  (!) 151/51 (!) 151/56 (!) 116/97  ?Pulse: 89 94 85 79  ?Resp: '18 18 18 16  '$ ?Temp: (!) 100.4 ?F (38 ?C) 100 ?F (37.8 ?C) 98 ?F (36.7 ?C) 98.1 ?F (36.7 ?C)  ?TempSrc: Axillary  Oral Oral  ?SpO2: 92% (!) 85% 98% 100%  ?Weight:      ?Height:      ? ? ?Intake/Output Summary (Last 24 hours) at 02/02/2022 1211 ?Last data filed at 02/02/2022 1046 ?Gross per 24 hour  ?Intake 3493.35 ml  ?Output 400 ml  ?Net 3093.35 ml  ? ?Filed Weights  ? 01/27/22 1730 01/31/22 1139  ?Weight: 91.2 kg 91.2 kg  ? ? ?Examination: ? ?General exam: Appears confused ?Respiratory system: Scattered crackles bl, normal WOB, 2L ?Cardiovascular system: S1-S2, RRR, no murmurs, no pedal edema ?Gastrointestinal system: Obese, soft, tender to palpation right upper quadrant, normal bowel sounds ?Central nervous system: Alert and oriented. No focal neurological deficits. ?Extremities: Symmetric 5 x 5 power. ?Skin: No rashes, lesions or ulcers ?Psychiatry: Judgement and insight appear normal. Mood & affect appropriate.  ? ? ? ?Data Reviewed: I have personally reviewed following labs and imaging studies ? ?CBC: ?Recent Labs  ?Lab 01/29/22 ?6468 01/30/22 ?0323 01/31/22 ?0448 02/01/22 ?0444 02/02/22 ?0503  ?WBC 7.2 6.0 7.3 13.0* 15.3*  ?HGB 13.0 12.0 12.9 13.9 13.0  ?HCT 40.1 37.9 41.2 43.8 40.9  ?MCV 92.8 93.1 92.8 92.8 94.2  ?PLT 229 192 230 317 229  ? ?Basic Metabolic Panel: ?Recent Labs  ?Lab 01/29/22 ?0321 01/30/22 ?0323 01/31/22 ?0448 02/01/22 ?0444 02/02/22 ?0503  ?NA 141 140 139 135 131*  ?K 3.3* 3.7 3.8 3.5 4.1  ?CL 104 105 104 102 98  ?CO2 '28 28 28 26 25  '$ ?GLUCOSE 119* 116* 117* 125* 91  ?BUN '12 9 10 13 10  '$ ?CREATININE 0.87 0.86 0.73 0.77 0.75  ?CALCIUM 8.9 8.8* 9.4 9.1 8.7*  ?MG 2.1 2.2 2.2 2.0 2.0  ? ?GFR: ?Estimated Creatinine Clearance: 60.6 mL/min (by C-G formula based on SCr of 0.75 mg/dL). ?Liver Function Tests: ?Recent Labs  ?Lab 01/29/22 ?2248 01/30/22 ?0323 01/31/22 ?0448 02/01/22 ?0444 02/02/22 ?0503  ?AST 87* 89* 87* 60* 51*  ?ALT 119* 119*  126* 99* 69*  ?ALKPHOS 255* 233* 283* 285* 234*  ?BILITOT 3.3* 3.8* 4.9* 3.7* 3.8*  ?PROT 7.2 6.7 7.5 7.5 7.0  ?ALBUMIN 3.3* 3.2* 3.5 3.4* 3.2*  ? ?Recent Labs  ?Lab 02/01/22 ?0820  ?LIPASE 3,022*  ? ?No results for input(s): AMMONIA in the last 168 hours. ?Coagulation Profile: ?No results for input(s): INR, PROTIME in the last 168 hours. ?Cardiac Enzymes: ?No results for input(s): CKTOTAL, CKMB, CKMBINDEX, TROPONINI in the last 168 hours. ?BNP (last 3 results) ?No results for input(s): PROBNP in the last 8760 hours. ?HbA1C: ?No results for input(s): HGBA1C in the last 72 hours. ?CBG: ?No results for input(s): GLUCAP in the last 168 hours. ?Lipid Profile: ?No results for input(s): CHOL, HDL, LDLCALC, TRIG, CHOLHDL, LDLDIRECT in the last 72 hours. ?Thyroid Function Tests: ?No results for input(s): TSH, T4TOTAL, FREET4, T3FREE, THYROIDAB in the last 72 hours. ?Anemia Panel: ?No results for input(s): VITAMINB12, FOLATE, FERRITIN, TIBC, IRON, RETICCTPCT in the last 72 hours. ?Sepsis Labs: ?No results for input(s): PROCALCITON, LATICACIDVEN in the last 168 hours. ? ?Recent Results (from the past 240 hour(s))  ?Urine Culture     Status: None  ? Collection Time: 01/27/22 10:04 PM  ?  Specimen: Urine, Clean Catch  ?Result Value Ref Range Status  ? Specimen Description   Final  ?  URINE, CLEAN CATCH ?Performed at [facility], [address] [place], [place] [address] ?  ? Special Requests   Final  ?  NONE ?Performed at [facility], [address] [place], [place] [address] ?  ? Culture   Final  ?  NO GROWTH ?Performed at [facility], [place] [address] [place],  [address] ?  ? Report Status 01/29/2022 FINAL  Final  ?Blood culture (routine x 2)     Status: None  ? Collection Time: 01/28/22 12:56 AM  ? Specimen: BLOOD  ?Result Value Ref Range Status  ? Specimen Description BLOOD BLOOD RIGHT FOREARM  Final  ? Special Requests   Final  ?  BOTTLES DRAWN AEROBIC AND ANAEROBIC Blood Culture results  may not be optimal due to an inadequate volume of blood received in culture bottles  ? Culture   Final  ?  NO GROWTH 5 DAYS ?Performed at [facility], [address] [place], [place] [address]

## 2022-02-02 NOTE — Progress Notes (Signed)
? Inpatient Follow-up/Progress Note ?  ?Patient ID: Marcia Livingston is a 76 y.o. female. ? ?Overnight Events / Subjective Findings ?Mild temperature overnight. Pain improving. Some altered mentation with pain medication. Daughter at bedside said she feels like the pain is overall improving. No n/v. Passing flatus. Last BM was 4/18. ?No other acute gi complaints. ? ?Review of Systems  ?Constitutional:  Positive for appetite change, fever and unexpected weight change. Negative for activity change, chills and fatigue.  ?Gastrointestinal:  Positive for abdominal pain (improving). Negative for abdominal distention, blood in stool, constipation, diarrhea and nausea.  ?Skin:  Positive for color change.   ? ?Medications ? ?Current Facility-Administered Medications:  ?  acetaminophen (TYLENOL) tablet 650 mg, 650 mg, Oral, Q6H PRN, 650 mg at 01/29/22 1523 **OR** acetaminophen (TYLENOL) suppository 650 mg, 650 mg, Rectal, Q6H PRN, Athena Masse, MD ?  albuterol (PROVENTIL) (2.5 MG/3ML) 0.083% nebulizer solution 2.5 mg, 2.5 mg, Nebulization, Q4H PRN, Priscella Mann, Sudheer B, MD ?  ALPRAZolam Duanne Moron) tablet 0.5 mg, 0.5 mg, Oral, BID PRN, Enzo Bi, MD, 0.5 mg at 02/01/22 2306 ?  alum & mag hydroxide-simeth (MAALOX/MYLANTA) 200-200-20 MG/5ML suspension 30 mL, 30 mL, Oral, Q6H PRN, Enzo Bi, MD, 30 mL at 01/30/22 2144 ?  buPROPion (WELLBUTRIN XL) 24 hr tablet 150 mg, 150 mg, Oral, Daily, Sharion Settler, NP, 150 mg at 02/02/22 0865 ?  camphor-menthol (SARNA) lotion, , Topical, PRN, Sharion Settler, NP, Given at 01/28/22 2240 ?  diphenhydrAMINE (BENADRYL) capsule 25 mg, 25 mg, Oral, Q6H PRN, Enzo Bi, MD, 25 mg at 01/31/22 2118 ?  DULoxetine (CYMBALTA) DR capsule 60 mg, 60 mg, Oral, Daily, Sharion Settler, NP, 60 mg at 02/02/22 0836 ?  feeding supplement (BOOST / RESOURCE BREEZE) liquid 1 Container, 1 Container, Oral, TID BM, Enzo Bi, MD, 1 Container at 01/31/22 2248 ?  fluticasone furoate-vilanterol (BREO ELLIPTA) 100-25 MCG/ACT  1 puff, 1 puff, Inhalation, Daily **AND** umeclidinium bromide (INCRUSE ELLIPTA) 62.5 MCG/ACT 1 puff, 1 puff, Inhalation, Daily, Sharion Settler, NP ?  gabapentin (NEURONTIN) capsule 300 mg, 300 mg, Oral, TID, Priscella Mann, Sudheer B, MD, 300 mg at 02/02/22 7846 ?  hydrochlorothiazide (HYDRODIURIL) tablet 25 mg, 25 mg, Oral, Daily, Enzo Bi, MD, 25 mg at 02/02/22 9629 ?  HYDROmorphone (DILAUDID) injection 0.5 mg, 0.5 mg, Intravenous, Q4H PRN, Priscella Mann, Sudheer B, MD, 0.5 mg at 02/01/22 1533 ?  lactated ringers infusion, , Intravenous, Continuous, Geanie Kenning, [place], Last Rate: 125 mL/hr at 02/01/22 2125, New Bag at 02/01/22 2125 ?  LORazepam (ATIVAN) injection 0.5 mg, 0.5 mg, Intravenous, Once PRN, Athena Masse, MD ?  losartan (COZAAR) tablet 50 mg, 50 mg, Oral, Daily, Sharion Settler, NP, 50 mg at 02/02/22 5284 ?  multivitamin with minerals tablet 1 tablet, 1 tablet, Oral, Daily, Enzo Bi, MD, 1 tablet at 02/02/22 1324 ?  ondansetron (ZOFRAN) tablet 4 mg, 4 mg, Oral, Q6H PRN **OR** ondansetron (ZOFRAN) injection 4 mg, 4 mg, Intravenous, Q6H PRN, Athena Masse, MD, 4 mg at 02/01/22 0409 ?  oxyCODONE (Oxy IR/ROXICODONE) immediate release tablet 5-10 mg, 5-10 mg, Oral, Q4H PRN, Enzo Bi, MD, 5 mg at 02/02/22 0500 ?  pantoprazole (PROTONIX) EC tablet 40 mg, 40 mg, Oral, Daily, Enzo Bi, MD, 40 mg at 02/02/22 4010 ?  phenol (CHLORASEPTIC) mouth spray 1 spray, 1 spray, Mouth/Throat, PRN, Enzo Bi, MD ?  rosuvastatin (CRESTOR) tablet 5 mg, 5 mg, Oral, Once per day on Mon Wed Fri, Morrison, Brenda, NP, 5 mg at 02/01/22 2725 ?  traZODone (DESYREL) tablet 50 mg, 50 mg, Oral, QHS PRN, Sharion Settler, NP, 50 mg at 01/29/22 2147 ? lactated ringers 125 mL/hr at 02/01/22 2125  ?  ?acetaminophen **OR** acetaminophen, albuterol, ALPRAZolam, alum & mag hydroxide-simeth, camphor-menthol, diphenhydrAMINE, HYDROmorphone (DILAUDID) injection, LORazepam, ondansetron **OR** ondansetron (ZOFRAN) IV, oxyCODONE, phenol,  traZODone  ? ?Objective  ? ? ?Vitals:  ? 02/01/22 2116 02/01/22 2303 02/02/22 0452 02/02/22 0828  ?BP: 92/64 (!) 151/51 (!) 151/56 (!) 116/97  ?Pulse: 89 94 85 79  ?Resp: '18 18 18 16  '$ ?Temp: (!) 100.4 ?F (38 ?C) 100 ?F (37.8 ?C) 98 ?F (36.7 ?C) 98.1 ?F (36.7 ?C)  ?TempSrc: Axillary  Oral Oral  ?SpO2: 92% (!) 85% 98% 100%  ?Weight:      ?Height:      ? ? ? ?Physical Exam ?Vitals and nursing note reviewed.  ?Constitutional:   ?   General: She is not in acute distress. ?   Appearance: Normal appearance. She is obese. She is not ill-appearing, toxic-appearing or diaphoretic.  ?HENT:  ?   Head: Normocephalic and atraumatic.  ?   Nose: Nose normal.  ?   Mouth/Throat:  ?   Mouth: Mucous membranes are moist.  ?   Pharynx: Oropharynx is clear.  ?Eyes:  ?   General: Scleral icterus present.  ?   Extraocular Movements: Extraocular movements intact.  ?Cardiovascular:  ?   Rate and Rhythm: Normal rate and regular rhythm.  ?   Heart sounds: Murmur heard.  ?  No friction rub. No gallop.  ?Pulmonary:  ?   Effort: Pulmonary effort is normal. No respiratory distress.  ?   Breath sounds: Normal breath sounds. No wheezing, rhonchi or rales.  ?Abdominal:  ?   General: Bowel sounds are normal. There is no distension.  ?   Palpations: Abdomen is soft.  ?   Tenderness: There is abdominal tenderness (less tender today). There is no guarding or rebound.  ?Musculoskeletal:  ?   Cervical back: Neck supple.  ?   Right lower leg: No edema.  ?   Left lower leg: No edema.  ?Skin: ?   General: Skin is warm and dry.  ?   Coloration: Skin is not jaundiced or pale.  ?Neurological:  ?   Mental Status: She is alert and oriented to person, place, and time.  ?   Comments: Slightly more disoriented today. Suspect 2/2 pain medication  ? ? ? ?Laboratory Data ?Recent Labs  ?Lab 01/31/22 ?0448 02/01/22 ?0444 02/02/22 ?0503  ?WBC 7.3 13.0* 15.3*  ?HGB 12.9 13.9 13.0  ?HCT 41.2 43.8 40.9  ?PLT 230 317 229  ? ?Recent Labs  ?Lab 01/31/22 ?0448 02/01/22 ?0444  02/02/22 ?0503  ?NA 139 135 131*  ?K 3.8 3.5 4.1  ?CL 104 102 98  ?CO2 '28 26 25  '$ ?BUN '10 13 10  '$ ?CREATININE 0.73 0.77 0.75  ?CALCIUM 9.4 9.1 8.7*  ?PROT 7.5 7.5 7.0  ?BILITOT 4.9* 3.7* 3.8*  ?ALKPHOS 283* 285* 234*  ?ALT 126* 99* 69*  ?AST 87* 60* 51*  ?GLUCOSE 117* 125* 91  ? ?No results for input(s): INR in the last 168 hours. ?  ? ?Imaging Studies: ?DG Chest Port 1 View ? ?Result Date: 02/02/2022 ?CLINICAL DATA:  Nausea, weakness EXAM: PORTABLE CHEST 1 VIEW COMPARISON:  Chest radiograph done on 10/22/2015, CT done on 01/29/2022 FINDINGS: There is poor inspiration. Linear densities are seen in the right lower lung fields. There is no focal consolidation. There are no signs of  alveolar pulmonary edema. There is no significant pleural effusion or pneumothorax. IMPRESSION: Linear densities in the right lower lung fields suggest subsegmental atelectasis. Electronically Signed   By: Elmer Picker M.D.   On: 02/02/2022 10:35  ? ?DG C-Arm 1-60 Min-No Report ? ?Result Date: 01/31/2022 ?Fluoroscopy was utilized by the requesting physician.  No radiographic interpretation.   ? ?Assessment:  ? ?# Post ERCP pancreatitis ?- improving clinically with ivf ?- pain meds leading to some altered mentation, but oveall controlling pain ?- wbc count more elevated today at 15K ?- pain improving ?- mild temp o/n 100.49F ?-tolerating full liquid diet ? 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 ?# obstructive jaundice ?- s/p ercp on 01/31/22 with stenign ?# cholestatic transaminitis 2/2 above- improving post ERC ? ?# Leukocytosis- suspect 2/2 pancreatitis ?- on abx ? ?Plan:  ?Continue to monitor for sx of pancreatitis ?Improving clinically with ivf ?Pain control helping pain, but causing some altered mentation ?LFTs overall improving. TB is stagnant- if LFTs or TB start to increase- recommend repeat imaging (CT) to assess for stent  migration ?Can slow IVF from 125cc/hr this afternoon ?Continue po as tolerated ?Communicated plan with daughter at bedside ?Appreciate oncology assistance. Planning EUS as outpatient. ?Supportive care including

## 2022-02-02 NOTE — Progress Notes (Deleted)
At bedside to place PIV per requets.  Pt not in room.  RN states pt is having an ultrasound.  RN to place PIV consult upon return to room. ?

## 2022-02-02 NOTE — Plan of Care (Signed)

## 2022-02-03 ENCOUNTER — Telehealth: Payer: Self-pay | Admitting: Internal Medicine

## 2022-02-03 DIAGNOSIS — C221 Intrahepatic bile duct carcinoma: Secondary | ICD-10-CM

## 2022-02-03 LAB — COMPREHENSIVE METABOLIC PANEL
ALT: 48 U/L — ABNORMAL HIGH (ref 0–44)
AST: 32 U/L (ref 15–41)
Albumin: 3.1 g/dL — ABNORMAL LOW (ref 3.5–5.0)
Alkaline Phosphatase: 193 U/L — ABNORMAL HIGH (ref 38–126)
Anion gap: 9 (ref 5–15)
BUN: 6 mg/dL — ABNORMAL LOW (ref 8–23)
CO2: 30 mmol/L (ref 22–32)
Calcium: 8.9 mg/dL (ref 8.9–10.3)
Chloride: 97 mmol/L — ABNORMAL LOW (ref 98–111)
Creatinine, Ser: 0.6 mg/dL (ref 0.44–1.00)
GFR, Estimated: >60 mL/min (ref >60)
Glucose, Bld: 101 mg/dL — ABNORMAL HIGH (ref 70–99)
Potassium: 3.4 mmol/L — ABNORMAL LOW (ref 3.5–5.1)
Sodium: 136 mmol/L (ref 135–145)
Total Bilirubin: 2.3 mg/dL — ABNORMAL HIGH (ref 0.3–1.2)
Total Protein: 6.8 g/dL (ref 6.5–8.1)

## 2022-02-03 MED ORDER — ALPRAZOLAM 0.5 MG PO TABS: 0.5000 mg | ORAL_TABLET | Freq: Two times a day (BID) | ORAL | 0 refills | Status: AC | PRN

## 2022-02-03 MED ORDER — OXYCODONE HCL 5 MG PO TABS
5.0000 mg | ORAL_TABLET | Freq: Three times a day (TID) | ORAL | 0 refills | Status: DC | PRN
Start: 2022-02-03 — End: 2022-02-21

## 2022-02-03 MED ORDER — ONDANSETRON HCL 4 MG PO TABS
4.0000 mg | ORAL_TABLET | Freq: Four times a day (QID) | ORAL | 0 refills | Status: AC | PRN
Start: 2022-02-03 — End: ?

## 2022-02-03 MED ORDER — HALOPERIDOL LACTATE 5 MG/ML IJ SOLN
2.5000 mg | Freq: Once | INTRAMUSCULAR | Status: AC
  Administered 2022-02-03: 2.5 mg via INTRAVENOUS
  Filled 2022-02-03: qty 1

## 2022-02-03 NOTE — Progress Notes (Signed)
IV haldol given. ?

## 2022-02-03 NOTE — Progress Notes (Signed)
Marcia Livingston   DOB:January 23, 1946   MR#:[id number]   ? ?Subjective: Status post ERCP on  4/18. patient resting in the bed.  Patient continues to complain of intermittent abdominal pain.  Family states that patient has had hallucinations confusion post narcotic pain medication yesterday.  However overall improved.  Patient is walked to the bathroom with her daughter. ? ?Objective:  ?Vitals:  ? 02/03/22 0459 02/03/22 0807  ?BP: (!) 156/67 (!) 136/100  ?Pulse: 78 79  ?Resp: 20 18  ?Temp: 98.1 ?F (36.7 ?C) 98.1 ?F (36.7 ?C)  ?SpO2: 93% 94%  ?  ? ?Intake/Output Summary (Last 24 hours) at 02/03/2022 1656 ?Last data filed at 02/03/2022 0037 ?Gross per 24 hour  ?Intake 0 ml  ?Output 0 ml  ?Net 0 ml  ? ? ?Physical Exam ?Vitals and nursing note reviewed.  ?HENT:  ?   Head: Normocephalic and atraumatic.  ?Cardiovascular:  ?   Rate and Rhythm: Regular rhythm.  ?Pulmonary:  ?   Comments: Decreased breath sounds bilaterally.  Coarse breath sounds ?Abdominal:  ?   General: Abdomen is flat.  ?   Palpations: Abdomen is soft.  ?Skin: ?   General: Skin is warm and dry.  ? ?  ?Labs:  ?Lab Results  ?Component Value Date  ? WBC 15.3 (H) 02/02/2022  ? HGB 13.0 02/02/2022  ? HCT 40.9 02/02/2022  ? MCV 94.2 02/02/2022  ? PLT 229 02/02/2022  ? NEUTROABS 3.9 01/30/2020  ? ? ?Lab Results  ?Component Value Date  ? NA 136 02/03/2022  ? K 3.4 (L) 02/03/2022  ? CL 97 (L) 02/03/2022  ? CO2 30 02/03/2022  ? ? ?Studies:  ?DG Chest Port 1 View ? ?Result Date: 02/02/2022 ?CLINICAL DATA:  Nausea, weakness EXAM: PORTABLE CHEST 1 VIEW COMPARISON:  Chest radiograph done on 10/22/2015, CT done on 01/29/2022 FINDINGS: There is poor inspiration. Linear densities are seen in the right lower lung fields. There is no focal consolidation. There are no signs of alveolar pulmonary edema. There is no significant pleural effusion or pneumothorax. IMPRESSION: Linear densities in the right lower lung fields suggest subsegmental atelectasis. Electronically Signed   By: Elmer Picker M.D.   On: 02/02/2022 10:81   ? ?#76 year old female patient with liver masses and biliary obstruction ?  ?#Liver lesions-highly suspicious for malignancy based on imaging-clinically suggestive of cholangiocarcinoma.  CA 19-9 elevated.  CT scan chest concerning for enlarged mediastinal lymphadenopathy; concerning for metastatic disease.  We will plan outpatient PET scan.  Also schedule EUS for tissue diagnosis. ?  ?#Biliary obstruction-s/p ERCP stenting  on 4/18.  LFTs improving bilirubin. ? ?#Hallucinations-secondary to narcotic pain medication.  ?  ?#The above plan of care was discussed with patient and her daughter.  In agreement. ? ?Cammie Sickle, MD ?02/03/2022  4:57 PM ?  ?

## 2022-02-03 NOTE — Plan of Care (Signed)
Patietn discharged to home with daughter who is legal guardian. Discharge instructions given and reviewed.  Piv removed with tip intact. ?

## 2022-02-03 NOTE — Discharge Summary (Signed)
Physician Discharge Summary  ?Marcia Livingston EXN:[id number] DOB: 02-26-1946 DOA: 01/27/2022 ? ?PCP: Baxter Hire, MD ? ?Admit date: 01/27/2022 ?Discharge date: 02/03/2022 ? ?Admitted From: Home ?Disposition:  Home ? ?Recommendations for Outpatient Follow-up:  ?Follow up with PCP in 1-2 weeks ?Follow up with oncology Dr. Lowell Bouton as directed ? ?Home Health: No ?Equipment/Devices: None ? ?Discharge Condition: Stable ?CODE STATUS: Full ?Diet recommendation: Soft/bland ? ?Brief/Interim Summary: ?76 y.o. female with medical history significant for OSA on CPAP, IBS, DM, COPD, anxiety and depression, colon polyps with recent colonoscopy on 3/1, who was sent by her PCP for evaluation of elevated LFTs.  Patient was seen by her PCP on 4/11 with a 1 week complaint of nausea and vomiting with decreased oral intake, generalized weakness.  On her follow-up on 4/14, she had ongoing symptoms and blood work revealed elevated bilirubin of 3.1.  During this time patient developed an orange color to her urine and she was noted to be jaundiced and was sent in for evaluation. ?  ?Patient has had extensive in-hospital work-up.  Investigation revealed biliary mass.  Suspicious for metastatic cholangiocarcinoma.  Guarded prognosis.  Oncology consulted.  Patient will need endoscopic ultrasound for biopsy however any treatment would be likely palliative in nature.  Patient underwent ERCP with bare-metal stent placement on 4/18 ?  ?Post ERCP patient developed right upper quadrant pain associated with increased lipase to 3000.  Discussed with GI.  Suspect post ERCP pancreatitis.  Initiate high rate maintenance fluids and pain control ?  ?4/20: Pain control overall improving.  White count increasing, bilirubin stagnant.  Patient more confused today.  Likely related to narcotics.  Mild temperature 100.4 ? ?4/21: Encephalopathy slowly improving.  Felt to be delirium secondary to extended hospital stay and narcotic administration.  Tolerating p.o.   Abdominal pain improved.  LFTs downtrending including bilirubin.  Stable for discharge home.  Clear return to ED instructions provided to patient's daughter at bedside.  Patient will follow-up with oncology and PCP postdischarge ? ? ? ?Discharge Diagnoses:  ?Principal Problem: ?  Cholangiocarcinoma ([place]) ?Active Problems: ?  Nausea and vomiting ?  CAD (coronary artery disease) ?  HTN (hypertension) ?  Depression ?  OSA (obstructive sleep apnea) ?  COPD (chronic obstructive pulmonary disease) ([place]) ?  Common bile duct obstruction ?  Pancreatic mass ?Cholangiocarcinoma ([place]) ?--MRCP showed findings of infiltrative cholangiocarcinoma with significant lymphadenopathy ?--IR determined no safe window for CT-guided biopsy ?-- ERCP on 4/18, bare-metal stent placed ?Plan: ?LFTs and bilirubin downtrending at time of discharge.  Okay for discharge home.  Recommend soft diet and adequate p.o. hydration.  We will follow-up outpatient with oncology for PET scan and further discussion regarding treatment options.  Treatment likely palliative in nature at this point.  Suggest outpatient referral to palliative care ?  ?Post ERCP pancreatitis ?Lipase 3000 on 4/19 associated with leukocytosis and right upper quadrant pain ?Clinically improving, though some delirium noted, likely due to narcotics ?Plan: ?Intravenous fluids discontinued.  Stable for discharge.  Recommend soft diet with adequate p.o. hydration.  Discussed with daughter at bedside ?  ?Nausea and vomiting ?Secondary to suspected malignancy with obstructive jaundice ?--improved, able to tolerate oral hydration.  As needed Zofran prescribed ?  ?CAD (coronary artery disease) ?No complaints of chest pain ?Continue rosuvastatin ?  ?Common bile duct obstruction ?--ECRP 4/18.  A biliary sphincterotomy was performed and One covered metal stent was placed into the common bile duct. ?-LFTs and bilirubin downtrending at time of discharge ?  ?  COPD (chronic obstructive pulmonary  disease) ([place]) ?Not acutely exacerbated ?Continue Trelegy Ellipta with DuoNebs as needed ?  ?OSA (obstructive sleep apnea) ?CPAP nightly if desired ?  ?Depression ?Continue Wellbutrin and Cymbalta and trazodone ?  ?HTN (hypertension) ?Continue losartan and hydrochlorothiazide ? ? ?Discharge Instructions ? ?Discharge Instructions   ? ? Diet - low sodium heart healthy   Complete by: As directed ?  ? Increase activity slowly   Complete by: As directed ?  ? ?  ? ?Allergies as of 02/03/2022   ? ?   Reactions  ? Penicillins Rash  ? Has patient had a PCN reaction causing immediate rash, facial/tongue/throat swelling, SOB or lightheadedness with hypotension: [id number]} ?Has patient had a PCN reaction causing severe rash involving mucus membranes or skin necrosis: [id number]} ?Has patient had a PCN reaction that required hospitalization [id number]} ?Has patient had a PCN reaction occurring within the last 10 years: [id number]} ?If all of the above answers are "NO", then may proceed with Cephalosporin use.  ? Codeine Hives  ? Sulfa Antibiotics Hives  ? ?  ? ?  ?Medication List  ?  ? ?STOP taking these medications   ? ?doxycycline 100 MG tablet ?Commonly known as: VIBRA-TABS ?  ?meclizine 25 MG tablet ?Commonly known as: ANTIVERT ?  ?mirabegron ER 50 MG Tb24 tablet ?Commonly known as: MYRBETRIQ ?  ?ondansetron 4 MG disintegrating tablet ?Commonly known as: ZOFRAN-ODT ?  ? ?  ? ?TAKE these medications   ? ?ALPRAZolam 0.5 MG tablet ?Commonly known as: Duanne Moron ?Take 1 tablet (0.5 mg total) by mouth 2 (two) times daily as needed for anxiety. ?  ?aspirin EC 81 MG tablet ?Take 81 mg by mouth daily. ?  ?buPROPion 150 MG 24 hr tablet ?Commonly known as: WELLBUTRIN XL ?TAKE 1 TABLET BY MOUTH  DAILY ?  ?busPIRone 15 MG tablet ?Commonly known as: BUSPAR ?Take 15 mg by mouth 3 (three) times daily. ?  ?calcium carbonate 500 MG chewable tablet ?Commonly known as: TUMS - dosed in mg elemental calcium ?Chew 1 tablet by mouth 3 (three)  times daily with meals. ?  ?cholecalciferol 1000 units tablet ?Commonly known as: VITAMIN D ?Take 1,000 Units by mouth daily. ?  ?colestipol 1 g tablet ?Commonly known as: COLESTID ?TAKE 1 TABLET BY MOUTH  DAILY ?  ?DULoxetine 60 MG capsule ?Commonly known as: CYMBALTA ?TAKE 1 CAPSULE BY MOUTH  DAILY ?  ?gabapentin 300 MG capsule ?Commonly known as: NEURONTIN ?Take 300 mg by mouth 3 (three) times daily. ?  ?hydrochlorothiazide 25 MG tablet ?Commonly known as: HYDRODIURIL ?TAKE 1 TABLET BY MOUTH  DAILY ?  ?losartan 50 MG tablet ?Commonly known as: COZAAR ?TAKE 1 TABLET BY MOUTH  DAILY ?  ?ondansetron 4 MG tablet ?Commonly known as: ZOFRAN ?Take 1 tablet (4 mg total) by mouth every 6 (six) hours as needed for nausea. ?  ?oxyCODONE 5 MG immediate release tablet ?Commonly known as: Roxicodone ?Take 1 tablet (5 mg total) by mouth every 8 (eight) hours as needed. ?  ?rosuvastatin 5 MG tablet ?Commonly known as: CRESTOR ?Take 5 mg by mouth 3 (three) times a week. ?  ?traZODone 50 MG tablet ?Commonly known as: DESYREL ?Take 1-2 tablets at bedtime for sleep. ?  ?Trelegy Ellipta 100-62.5-25 MCG/ACT Aepb ?Generic drug: Fluticasone-Umeclidin-Vilant ?Inhale 1 puff into the lungs daily. ?  ?vitamin B-12 500 MCG tablet ?Commonly known as: CYANOCOBALAMIN ?Take 500 mcg by mouth daily. ?  ? ?  ? ? Follow-up Information   ? ?  Cammie Sickle, MD. Schedule an appointment as soon as possible for a visit.   ?Specialties: Internal Medicine, Oncology ?Why: Call to confirm followup appointment date and time ?Contact information: ?[place] [address] ?[phone] ? ? ?  ?  ? ? Baxter Hire, MD. Schedule an appointment as soon as possible for a visit in 1 week(s).   ?Specialty: Internal Medicine ?Contact information: ?[address] ?[place] [address] ?[phone] ? ? ?  ?  ? ?  ?  ? ?  ? ?Allergies  ?Allergen Reactions  ? Penicillins Rash  ?  Has patient had a PCN reaction causing immediate rash,  facial/tongue/throat swelling, SOB or lightheadedness with hypotension: [id number]} ?Has patient had a PCN reaction causing severe rash involving mucus membranes or skin necrosis: no:[id number]} ?Has patient had a

## 2022-02-03 NOTE — Care Management Important Message (Signed)
Important Message ? ?Patient Details  ?Name: Marcia Livingston ?MRN: [id number] ?Date of Birth: Jul 08, 1946 ? ? ?Medicare Important Message Given:  Yes ? ? ? ? ?[name] ?02/03/2022, 12:06 PM ?

## 2022-02-03 NOTE — Progress Notes (Signed)
Patient has been fidgety all night and has not slept even after being given trazodone and benadryl per daughter request and visual assessment of pt scratching and being fidgety puling at IV lines and gown continually. Patient was later given xanax which was still not effective in calming patient nor allowing her to sleep. Daughter is at bedside and requesting an intervention that will help mom sleep and calm down, provider notified.  ?

## 2022-02-03 NOTE — Telephone Encounter (Signed)
Please schedule follow-up in week of May 1st-MD; labs CBC CMP; ca-19-9.  ? ?Also schedule PET scan ASAP.  ? ?Kristie-please follow-up regarding EUS.  ? ?Thanks ?GB ?

## 2022-02-03 NOTE — Progress Notes (Signed)
? Inpatient Follow-up/Progress Note ?  ?Patient ID: Marcia Livingston is a 76 y.o. female. ? ?Overnight Events / Subjective Findings ?No further fevers. LFTs continue to improve including bilirubin today. ?Required some haldol for confusion overnight. ?No other acute GI complaints. ?Pain continues to improve. ?No n/v. Passing flatus. Last BM was 4/18. ?No other acute gi complaints. ? ?Review of Systems  ?Constitutional:  Positive for appetite change and unexpected weight change. Negative for activity change, chills, fatigue and fever.  ?Gastrointestinal:  Positive for abdominal pain (improving). Negative for abdominal distention, blood in stool, constipation, diarrhea and nausea.  ?Skin:  Positive for color change.   ? ?Medications ? ?Current Facility-Administered Medications:  ?  acetaminophen (TYLENOL) tablet 650 mg, 650 mg, Oral, Q6H PRN, 650 mg at 01/29/22 1523 **OR** acetaminophen (TYLENOL) suppository 650 mg, 650 mg, Rectal, Q6H PRN, Athena Masse, MD ?  albuterol (PROVENTIL) (2.5 MG/3ML) 0.083% nebulizer solution 2.5 mg, 2.5 mg, Nebulization, Q4H PRN, Sreenath, Sudheer B, MD ?  ALPRAZolam Duanne Moron) tablet 0.5 mg, 0.5 mg, Oral, BID PRN, Enzo Bi, MD, 0.5 mg at 02/03/22 0105 ?  alum & mag hydroxide-simeth (MAALOX/MYLANTA) 200-200-20 MG/5ML suspension 30 mL, 30 mL, Oral, Q6H PRN, Enzo Bi, MD, 30 mL at 02/03/22 0904 ?  buPROPion (WELLBUTRIN XL) 24 hr tablet 150 mg, 150 mg, Oral, Daily, Sharion Settler, NP, 150 mg at 02/03/22 0851 ?  camphor-menthol (SARNA) lotion, , Topical, PRN, Sharion Settler, NP, Given at 01/28/22 2240 ?  diphenhydrAMINE (BENADRYL) capsule 25 mg, 25 mg, Oral, Q6H PRN, Enzo Bi, MD, 25 mg at 02/02/22 2110 ?  DULoxetine (CYMBALTA) DR capsule 60 mg, 60 mg, Oral, Daily, Sharion Settler, NP, 60 mg at 02/03/22 0850 ?  feeding supplement (BOOST / RESOURCE BREEZE) liquid 1 Container, 1 Container, Oral, TID BM, Enzo Bi, MD, 1 Container at 02/02/22 2026 ?  fluticasone furoate-vilanterol (BREO  ELLIPTA) 100-25 MCG/ACT 1 puff, 1 puff, Inhalation, Daily **AND** umeclidinium bromide (INCRUSE ELLIPTA) 62.5 MCG/ACT 1 puff, 1 puff, Inhalation, Daily, Sharion Settler, NP ?  gabapentin (NEURONTIN) capsule 300 mg, 300 mg, Oral, TID, Priscella Mann, Sudheer B, MD, 300 mg at 02/03/22 0851 ?  hydrochlorothiazide (HYDRODIURIL) tablet 25 mg, 25 mg, Oral, Daily, Enzo Bi, MD, 25 mg at 02/03/22 0851 ?  HYDROmorphone (DILAUDID) injection 0.5 mg, 0.5 mg, Intravenous, Q4H PRN, Priscella Mann, Sudheer B, MD, 0.5 mg at 02/01/22 1533 ?  lactated ringers infusion, , Intravenous, Continuous, Geanie Kenning, [place], Last Rate: 125 mL/hr at 02/03/22 0905, New Bag at 02/03/22 0905 ?  LORazepam (ATIVAN) injection 0.5 mg, 0.5 mg, Intravenous, Once PRN, Athena Masse, MD ?  losartan (COZAAR) tablet 50 mg, 50 mg, Oral, Daily, Sharion Settler, NP, 50 mg at 02/03/22 6144 ?  multivitamin with minerals tablet 1 tablet, 1 tablet, Oral, Daily, Enzo Bi, MD, 1 tablet at 02/03/22 0850 ?  ondansetron (ZOFRAN) tablet 4 mg, 4 mg, Oral, Q6H PRN **OR** ondansetron (ZOFRAN) injection 4 mg, 4 mg, Intravenous, Q6H PRN, Athena Masse, MD, 4 mg at 02/01/22 0409 ?  oxyCODONE (Oxy IR/ROXICODONE) immediate release tablet 5-10 mg, 5-10 mg, Oral, Q4H PRN, Enzo Bi, MD, 5 mg at 02/02/22 1222 ?  pantoprazole (PROTONIX) EC tablet 40 mg, 40 mg, Oral, Daily, Enzo Bi, MD, 40 mg at 02/03/22 0851 ?  phenol (CHLORASEPTIC) mouth spray 1 spray, 1 spray, Mouth/Throat, PRN, Enzo Bi, MD ?  rosuvastatin (CRESTOR) tablet 5 mg, 5 mg, Oral, Once per day on Mon Wed Fri, Morrison, Brenda, NP, 5 mg at 02/03/22  0855 ?  traZODone (DESYREL) tablet 50 mg, 50 mg, Oral, QHS PRN, Sharion Settler, NP, 50 mg at 02/02/22 2054 ? lactated ringers 125 mL/hr at 02/03/22 0905  ?  ?acetaminophen **OR** acetaminophen, albuterol, ALPRAZolam, alum & mag hydroxide-simeth, camphor-menthol, diphenhydrAMINE, HYDROmorphone (DILAUDID) injection, LORazepam, ondansetron **OR** ondansetron (ZOFRAN) IV,  oxyCODONE, phenol, traZODone  ? ?Objective  ? ? ?Vitals:  ? 02/02/22 1544 02/02/22 2050 02/03/22 0459 02/03/22 0807  ?BP: (!) 156/59 (!) 162/66 (!) 156/67 (!) 136/100  ?Pulse: 88 96 78 79  ?Resp: '16 20 20 18  '$ ?Temp: 98.6 ?F (37 ?C) 98.5 ?F (36.9 ?C) 98.1 ?F (36.7 ?C) 98.1 ?F (36.7 ?C)  ?TempSrc: Oral Oral Oral   ?SpO2: 92% 93% 93% 94%  ?Weight:      ?Height:      ? ? ? ?Physical Exam ?Vitals and nursing note reviewed.  ?Constitutional:   ?   General: She is not in acute distress. ?   Appearance: Normal appearance. She is obese. She is not ill-appearing, toxic-appearing or diaphoretic.  ?HENT:  ?   Head: Normocephalic and atraumatic.  ?   Nose: Nose normal.  ?   Mouth/Throat:  ?   Mouth: Mucous membranes are moist.  ?   Pharynx: Oropharynx is clear.  ?Eyes:  ?   General: No scleral icterus. ?   Extraocular Movements: Extraocular movements intact.  ?Cardiovascular:  ?   Rate and Rhythm: Normal rate and regular rhythm.  ?   Heart sounds: Murmur heard.  ?  No friction rub. No gallop.  ?Pulmonary:  ?   Effort: Pulmonary effort is normal. No respiratory distress.  ?   Breath sounds: Normal breath sounds. No wheezing, rhonchi or rales.  ?Abdominal:  ?   General: Bowel sounds are normal. There is no distension.  ?   Palpations: Abdomen is soft.  ?   Tenderness: There is no abdominal tenderness. There is no guarding or rebound.  ?Musculoskeletal:  ?   Cervical back: Neck supple.  ?   Right lower leg: No edema.  ?   Left lower leg: No edema.  ?Skin: ?   General: Skin is warm and dry.  ?   Coloration: Skin is not jaundiced or pale.  ?Neurological:  ?   Mental Status: She is alert.  ? ? ? ?Laboratory Data ?Recent Labs  ?Lab 01/31/22 ?0448 02/01/22 ?0444 02/02/22 ?0503  ?WBC 7.3 13.0* 15.3*  ?HGB 12.9 13.9 13.0  ?HCT 41.2 43.8 40.9  ?PLT 230 317 229  ? ? ?Recent Labs  ?Lab 02/01/22 ?0444 02/02/22 ?0503 02/03/22 ?0533  ?NA 135 131* 136  ?K 3.5 4.1 3.4*  ?CL 102 98 97*  ?CO2 '26 25 30  '$ ?BUN 13 10 6*  ?CREATININE 0.77 0.75 0.60   ?CALCIUM 9.1 8.7* 8.9  ?PROT 7.5 7.0 6.8  ?BILITOT 3.7* 3.8* 2.3*  ?ALKPHOS 285* 234* 193*  ?ALT 99* 69* 48*  ?AST 60* 51* 32  ?GLUCOSE 125* 91 101*  ? ? ?No results for input(s): INR in the last 168 hours. ?  ? ?Imaging Studies: ?DG Chest Port 1 View ? ?Result Date: 02/02/2022 ?CLINICAL DATA:  Nausea, weakness EXAM: PORTABLE CHEST 1 VIEW COMPARISON:  Chest radiograph done on 10/22/2015, CT done on 01/29/2022 FINDINGS: There is poor inspiration. Linear densities are seen in the right lower lung fields. There is no focal consolidation. There are no signs of alveolar pulmonary edema. There is no significant pleural effusion or pneumothorax. IMPRESSION: Linear densities in the right lower lung  fields suggest subsegmental atelectasis. Electronically Signed   By: Elmer Picker M.D.   On: 02/02/2022 10:35   ? ?Assessment:  ? ?# Post ERCP pancreatitis- resolving ?- improving clinically with ivf ?- pain meds leading to some altered mentation, but oveall controlling pain ?- wbc count more elevated today at 15K ?- pain improving ?- mild temp o/n 100.45F ?-tolerating full liquid diet ? 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 ?# obstructive jaundice ?- s/p ercp on 01/31/22 with stenign ?# cholestatic transaminitis 2/2 above- improving post ERC ? ?# Leukocytosis- suspect 2/2 pancreatitis ?- on abx ? ?Plan:  ?Improving clinically with ivf- can d/c ivf if patient tolerating PO ?Advance diet as tolerated ?Pain control helping pain, but causing some altered mentation ?LFTs continue to improve including TB today. ?If LFTs or TB start to increase- recommend repeat imaging (CT) to assess for stent migration ?Appreciate oncology assistance. Planning EUS as outpatient. ?Supportive care including pain control and anti emetics as per primary team ?Discussed with family at bedside. ?Ok from GI standpoint to dispo. ? ?GI to sign  off. Available as needed. Dr. Marius Ditch will be covering over the weekend if needed. ? ?I personally performed the service. ? ?Management of other medical comorbidities as per primary team ? ?Thank you for allowing

## 2022-02-06 ENCOUNTER — Telehealth: Payer: Self-pay

## 2022-02-06 ENCOUNTER — Other Ambulatory Visit: Payer: Self-pay

## 2022-02-06 DIAGNOSIS — C221 Intrahepatic bile duct carcinoma: Secondary | ICD-10-CM

## 2022-02-06 NOTE — Telephone Encounter (Signed)
Labs entered.

## 2022-02-06 NOTE — Telephone Encounter (Signed)
Dr.B saw patient in the hospital, never seen in the clinic. ?

## 2022-02-06 NOTE — Telephone Encounter (Signed)
Attempted to call daughter, Fara Chute, to obtain further information regarding legal guardianship and to arrange EUS. Voicemail is full. Voicemail was left by new patient scheduler to arrange follow up with Dr. Rogue Bussing. ?

## 2022-02-07 ENCOUNTER — Other Ambulatory Visit: Payer: Self-pay

## 2022-02-07 ENCOUNTER — Telehealth: Payer: Self-pay

## 2022-02-07 NOTE — Telephone Encounter (Signed)
Spoke with daughter, Fara Chute. EUS has been scheduled for 02/16/22 with Dr. Mont Dutton at [facility]. Instructions have been reviewed and a copy sent to her mychart. Encouraged to call with any questions. ?

## 2022-02-13 ENCOUNTER — Encounter: Payer: Self-pay | Admitting: Internal Medicine

## 2022-02-13 ENCOUNTER — Ambulatory Visit: Payer: Medicare Other | Admitting: Internal Medicine

## 2022-02-13 ENCOUNTER — Other Ambulatory Visit: Payer: Medicare Other

## 2022-02-13 NOTE — Progress Notes (Signed)
I spoke to the daughter regarding her mother?s concerns for Rigors/ chills. Patient at high risk of sepsis/infections, given her history of biliary obstruction/ERCP/stent. Recommend urgent evaluation in the emergency room.

## 2022-02-14 ENCOUNTER — Encounter
Admission: RE | Admit: 2022-02-14 | Discharge: 2022-02-14 | Disposition: A | Discharge status: Home / Self Care | Payer: Medicare Other | Source: Ambulatory Visit | Attending: Internal Medicine | Admitting: Internal Medicine

## 2022-02-14 DIAGNOSIS — C221 Intrahepatic bile duct carcinoma: Secondary | ICD-10-CM | POA: Insufficient documentation

## 2022-02-14 LAB — GLUCOSE, CAPILLARY: Glucose-Capillary: 93 mg/dL (ref 70–99)

## 2022-02-14 MED ORDER — FLUDEOXYGLUCOSE F - 18 (FDG) INJECTION
10.4000 | Freq: Once | INTRAVENOUS | Status: AC | PRN
  Administered 2022-02-14: 11 via INTRAVENOUS

## 2022-02-14 NOTE — Telephone Encounter (Signed)
Dr. B spoke to patient's daughter. ?

## 2022-02-15 ENCOUNTER — Encounter: Payer: Self-pay | Admitting: Internal Medicine

## 2022-02-15 NOTE — Progress Notes (Signed)
I spoke with patient?s daughter regarding the results of the pet scan- suggestive of stage 4 cancer. Will await biopsy tomorrow. Discuss with Dr. Bary Castilla- plan port placement. Will make referral. Follow up as planned. ? ?

## 2022-02-15 NOTE — Anesthesia Preprocedure Evaluation (Signed)
Anesthesia Evaluation  ?Patient identified by MRN, date of birth, ID band ?Patient awake ? ? ? ?Reviewed: ?Allergy & Precautions, H&P , NPO status , Patient's Chart, lab work & pertinent test results, reviewed documented beta blocker date and time  ? ?History of Anesthesia Complications ?Negative for: history of anesthetic complications ? ?Airway ?Mallampati: IV ? ? ?Neck ROM: full ? ? ? Dental ? ?(+) Caps ?  ?Pulmonary ?sleep apnea and Continuous Positive Airway Pressure Ventilation , COPD, former smoker,  ?  ?Pulmonary exam normal ? ? ? ? ? ? ? Cardiovascular ?Exercise Tolerance: Poor ?hypertension, + CAD  ?+ Valvular Problems/Murmurs (mild-mod AS)  ?Rhythm:Regular Rate:Normal ?+ Systolic murmurs ?ECG 01/27/22:  ?Normal sinus rhythm ?Low voltage QRS ?Cannot rule out Anterior infarct , age undetermined ? 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 ?Myocardial perfusion 12/30/20:  ?1. ?Normal left ventricular function  ?2. ?No regional wall motion maladies  ?3.  No evidence for scar or ischemia ?  ?Neuro/Psych ?PSYCHIATRIC DISORDERS Anxiety Depression negative neurological ROS ?   ? GI/Hepatic ?Neg liver ROS, GERD  Medicated,Cholangiocarcinoma  ?  ?Endo/Other  ?Obesity; prediabetes ? Renal/GU ?negative Renal ROS  ? ?  ?Musculoskeletal ? ?(+) Arthritis ,  ? Abdominal ?  ?Peds ? Hematology ?negative hematology ROS ?(+)   ?Anesthesia Other Findings ? ? Reproductive/Obstetrics ? ?  ? ? ? ? ? ? ? ? ? ? ? ? ? ?  ?  ? ? ? ? ? ? ? ? ?Anesthesia Physical ? ?Anesthesia Plan ? ?ASA: 3 ? ?Anesthesia Plan: General  ? ?Post-op Pain Management:   ? ?Induction: Intravenous ? ?PONV Risk Score and Plan: 3 and Treatment may vary due to age or medical condition ? ?Airway  Management Planned: Natural Airway ? ?Additional Equipment:  ? ?Intra-op Plan:  ? ?Post-operative Plan: Extubation in OR ? ?Informed Consent: I have reviewed the patients History and Physical, chart, labs and discussed the procedure including the risks, benefits and alternatives for the proposed anesthesia with the patient or authorized representative who has indicated his/her understanding and acceptance.  ? ? ? ?Dental Advisory Given ? ?Plan Discussed with: CRNA ? ?Anesthesia Plan Comments: (LMA/GETA backup discussed.  Patient consented for risks of anesthesia including but not limited to:  ?- adverse reactions to medications ?- damage to eyes, teeth, lips or other oral mucosa ?- nerve damage due to positioning  ?- sore throat or hoarseness ?- damage to heart, brain, nerves, lungs, other parts of body or loss of life ? ?Informed patient about role of CRNA in peri- and intra-operative care.  Patient voiced understanding.)  ? ? ? ? ? ? ?Anesthesia Quick Evaluation ? ?

## 2022-02-16 ENCOUNTER — Encounter: Payer: Self-pay | Admitting: Internal Medicine

## 2022-02-16 ENCOUNTER — Telehealth: Payer: Self-pay

## 2022-02-16 ENCOUNTER — Inpatient Hospital Stay: Payer: Medicare Other | Admitting: Internal Medicine

## 2022-02-16 ENCOUNTER — Other Ambulatory Visit: Payer: Medicare Other

## 2022-02-16 ENCOUNTER — Ambulatory Visit
Admission: RE | Admit: 2022-02-16 | Discharge: 2022-02-16 | Disposition: A | Discharge status: Home / Self Care | Payer: Medicare Other | Attending: Internal Medicine | Admitting: Internal Medicine

## 2022-02-16 ENCOUNTER — Encounter
Admission: RE | Disposition: A | Discharge status: Home / Self Care | Payer: Self-pay | Source: Home / Self Care | Attending: Internal Medicine

## 2022-02-16 ENCOUNTER — Ambulatory Visit: Payer: Medicare Other | Admitting: Anesthesiology

## 2022-02-16 DIAGNOSIS — Z79899 Other long term (current) drug therapy: Secondary | ICD-10-CM | POA: Insufficient documentation

## 2022-02-16 DIAGNOSIS — I2510 Atherosclerotic heart disease of native coronary artery without angina pectoris: Secondary | ICD-10-CM | POA: Diagnosis not present

## 2022-02-16 DIAGNOSIS — F419 Anxiety disorder, unspecified: Secondary | ICD-10-CM | POA: Diagnosis not present

## 2022-02-16 DIAGNOSIS — M1990 Unspecified osteoarthritis, unspecified site: Secondary | ICD-10-CM | POA: Diagnosis not present

## 2022-02-16 DIAGNOSIS — E669 Obesity, unspecified: Secondary | ICD-10-CM | POA: Diagnosis not present

## 2022-02-16 DIAGNOSIS — R7303 Prediabetes: Secondary | ICD-10-CM | POA: Diagnosis not present

## 2022-02-16 DIAGNOSIS — G4730 Sleep apnea, unspecified: Secondary | ICD-10-CM | POA: Insufficient documentation

## 2022-02-16 DIAGNOSIS — C480 Malignant neoplasm of retroperitoneum: Secondary | ICD-10-CM | POA: Insufficient documentation

## 2022-02-16 DIAGNOSIS — K219 Gastro-esophageal reflux disease without esophagitis: Secondary | ICD-10-CM | POA: Diagnosis not present

## 2022-02-16 DIAGNOSIS — J449 Chronic obstructive pulmonary disease, unspecified: Secondary | ICD-10-CM | POA: Insufficient documentation

## 2022-02-16 DIAGNOSIS — Z7951 Long term (current) use of inhaled steroids: Secondary | ICD-10-CM | POA: Diagnosis not present

## 2022-02-16 DIAGNOSIS — Z87891 Personal history of nicotine dependence: Secondary | ICD-10-CM | POA: Insufficient documentation

## 2022-02-16 DIAGNOSIS — K317 Polyp of stomach and duodenum: Secondary | ICD-10-CM | POA: Insufficient documentation

## 2022-02-16 DIAGNOSIS — K869 Disease of pancreas, unspecified: Secondary | ICD-10-CM | POA: Diagnosis present

## 2022-02-16 DIAGNOSIS — C221 Intrahepatic bile duct carcinoma: Secondary | ICD-10-CM

## 2022-02-16 DIAGNOSIS — F32A Depression, unspecified: Secondary | ICD-10-CM | POA: Diagnosis not present

## 2022-02-16 DIAGNOSIS — I10 Essential (primary) hypertension: Secondary | ICD-10-CM | POA: Diagnosis not present

## 2022-02-16 HISTORY — PX: EUS: SHX5427

## 2022-02-16 SURGERY — UPPER ENDOSCOPIC ULTRASOUND (EUS) LINEAR
Anesthesia: General

## 2022-02-16 MED ORDER — PROPOFOL 500 MG/50ML IV EMUL
INTRAVENOUS | Status: DC | PRN
  Administered 2022-02-16: 145 ug/kg/min via INTRAVENOUS

## 2022-02-16 MED ORDER — GLYCOPYRROLATE 0.2 MG/ML IJ SOLN
INTRAMUSCULAR | Status: DC | PRN
  Administered 2022-02-16: .2 mg via INTRAVENOUS

## 2022-02-16 MED ORDER — PROPOFOL 10 MG/ML IV BOLUS
INTRAVENOUS | Status: DC | PRN
  Administered 2022-02-16: 10 mg via INTRAVENOUS
  Administered 2022-02-16: 60 mg via INTRAVENOUS
  Administered 2022-02-16: 10 mg via INTRAVENOUS

## 2022-02-16 MED ORDER — SODIUM CHLORIDE 0.9 % IV SOLN
INTRAVENOUS | Status: DC
  Administered 2022-02-16: 1000 mL via INTRAVENOUS

## 2022-02-16 MED ORDER — LIDOCAINE HCL (CARDIAC) PF 100 MG/5ML IV SOSY
PREFILLED_SYRINGE | INTRAVENOUS | Status: DC | PRN
  Administered 2022-02-16: 100 mg via INTRAVENOUS

## 2022-02-16 NOTE — Telephone Encounter (Signed)
Referral faxed to [facility] for port placement with Dr. Bary Castilla faxed. ?

## 2022-02-16 NOTE — OR Nursing (Signed)
Specimen collected using 25g Sharkcore biopsy needle from Peri-pancreatic mass.  ?

## 2022-02-16 NOTE — Anesthesia Postprocedure Evaluation (Signed)
Anesthesia Post Note ? ?Patient: MIRCA YALE ? ?Procedure(s) Performed: UPPER ENDOSCOPIC ULTRASOUND (EUS) LINEAR ? ?Patient location during evaluation: PACU ?Anesthesia Type: General ?Level of consciousness: awake and alert, oriented and patient cooperative ?Pain management: pain level controlled ?Vital Signs Assessment: post-procedure vital signs reviewed and stable ?Respiratory status: spontaneous breathing, nonlabored ventilation and respiratory function stable ?Cardiovascular status: blood pressure returned to baseline and stable ?Postop Assessment: adequate PO intake ?Anesthetic complications: no ? ? ?No notable events documented. ? ? ?Last Vitals:  ?Vitals:  ? 02/16/22 0904 02/16/22 0911  ?BP: 109/79 (!) 149/65  ?Pulse: 78 72  ?Resp: 17 18  ?Temp:    ?SpO2: 97% 97%  ?  ?Last Pain:  ?Vitals:  ? 02/16/22 0911  ?TempSrc:   ?PainSc: 0-No pain  ? ? ?  ?  ?  ?  ?  ?  ? ?Darrin Nipper ? ? ? ? ?

## 2022-02-16 NOTE — Anesthesia Procedure Notes (Signed)
Procedure Name: General with mask airway ?Date/Time: 02/16/2022 8:27 AM ?Performed by: Kelton Pillar, CRNA ?Pre-anesthesia Checklist: Emergency Drugs available, Patient identified, Suction available and Patient being monitored ?Patient Re-evaluated:Patient Re-evaluated prior to induction ?Oxygen Delivery Method: Simple face mask ?Induction Type: IV induction ?Placement Confirmation: positive ETCO2, CO2 detector and breath sounds checked- equal and bilateral ?Dental Injury: Teeth and Oropharynx as per pre-operative assessment  ? ? ? ? ?

## 2022-02-16 NOTE — Transfer of Care (Signed)
Immediate Anesthesia Transfer of Care Note ? ?Patient: Marcia Livingston ? ?Procedure(s) Performed: UPPER ENDOSCOPIC ULTRASOUND (EUS) LINEAR ? ?Patient Location: Endoscopy Unit ? ?Anesthesia Type:General ? ?Level of Consciousness: drowsy and patient cooperative ? ?Airway & Oxygen Therapy: Patient Spontanous Breathing and Patient connected to face mask oxygen ? ?Post-op Assessment: Report given to RN and Post -op Vital signs reviewed and stable ? ?Post vital signs: Reviewed and stable ? ?Last Vitals:  ?Vitals Value Taken Time  ?BP 158/64 02/16/22 0852  ?Temp 35.7 ?C 02/16/22 0851  ?Pulse 74 02/16/22 0853  ?Resp 14 02/16/22 0853  ?SpO2 100 % 02/16/22 0853  ?Vitals shown include unvalidated device data. ? ?Last Pain:  ?Vitals:  ? 02/16/22 0851  ?TempSrc: Temporal  ?PainSc: Asleep  ?   ? ?  ? ?Complications: No notable events documented. ?

## 2022-02-16 NOTE — Discharge Instructions (Signed)
Discharge to home °

## 2022-02-16 NOTE — Telephone Encounter (Signed)
Patient is scheduled for initial consult with Dr. Bary Castilla on 02/23/22. ?

## 2022-02-16 NOTE — Interval H&P Note (Signed)
History and Physical Interval Note: ? ?02/16/2022 ?8:02 AM ? ?Marcia Livingston  has presented today for surgery, with the diagnosis of liver masses and biliary obstruction.  The various methods of treatment have been discussed with the patient and family. After consideration of risks, benefits and other options for treatment, the patient has consented to  Procedure(s) with comments: ?UPPER ENDOSCOPIC ULTRASOUND (EUS) LINEAR (N/A) - [facility] as a surgical intervention.  The patient's history has been reviewed, patient examined, no change in status, stable for surgery.  I have reviewed the patient's chart and labs.  Questions were answered to the patient's satisfaction.   ? ? ?Tillie Rung ? ? ?

## 2022-02-16 NOTE — Op Note (Signed)
[facility] ?Gastroenterology ?Patient Name: Marcia Livingston ?Procedure Date: 02/16/2022 7:55 AM ?MRN: [id number] ?Account #: [id number] ?Date of Birth: [birth year] ?Admit Type: Outpatient ?Age: 76 ?Room: [facility] ENDO ROOM 3 ?Gender: Female ?Note Status: Finalized ?Instrument Name: Linear EUS Scope [id number] Endoscope [id number] ?Procedure:             Upper EUS ?Indications:           Suspected mass in pancreas on CT scan, Obstruction of  ?                       bile duct on ERCP s/p covered metal stent placement,  ?                       Elevated CA 19-9 ?Patient Profile:       Refer to note in patient chart for documentation of  ?                       history and physical. ?Providers:             Murray Hodgkins. Davyn Elsasser ?Referring MD:          Andres Labrum, MD (Referring MD), Charlaine Dalton  ?                       (Referring MD), Lucilla Lame MD, MD (Referring MD) ?Medicines:             Propofol per Anesthesia ?Complications:         No immediate complications. ?Procedure:             Pre-Anesthesia Assessment: ?                       Prior to the procedure, a History and Physical was  ?                       performed, and patient medications and allergies were  ?                       reviewed. The patient is competent. The risks and  ?                       benefits of the procedure and the sedation options and  ?                       risks were discussed with the patient. All questions  ?                       were answered and informed consent was obtained.  ?                       Patient identification and proposed procedure were  ?                       verified by the physician, the nurse and the  ?                       anesthesiologist in the pre-procedure area. Mental  ?  Status Examination: alert and oriented. Airway  ?                       Examination: normal oropharyngeal airway and neck  ?                       mobility. Respiratory Examination: clear to  ?                        auscultation. CV Examination: normal. Prophylactic  ?                       Antibiotics: The patient does not require prophylactic  ?                       antibiotics. Prior Anticoagulants: The patient has  ?                       taken no previous anticoagulant or antiplatelet  ?                       agents. ASA Grade Assessment: III - A patient with  ?                       severe systemic disease. After reviewing the risks and  ?                       benefits, the patient was deemed in satisfactory  ?                       condition to undergo the procedure. The anesthesia  ?                       plan was to use monitored anesthesia care (MAC).  ?                       Immediately prior to administration of medications,  ?                       the patient was re-assessed for adequacy to receive  ?                       sedatives. The heart rate, respiratory rate, oxygen  ?                       saturations, blood pressure, adequacy of pulmonary  ?                       ventilation, and response to care were monitored  ?                       throughout the procedure. The physical status of the  ?                       patient was re-assessed after the procedure. ?                       After obtaining informed consent, the endoscope was  ?  passed under direct vision. Throughout the procedure,  ?                       the patient's blood pressure, pulse, and oxygen  ?                       saturations were monitored continuously. The Endoscope  ?                       was introduced through the mouth, and advanced to the  ?                       duodenum for ultrasound examination from the  ?                       esophagus, stomach and duodenum. The upper EUS was  ?                       accomplished without difficulty. The patient tolerated  ?                       the procedure well. The Endoscope was introduced  ?                       through the mouth, and advanced  to the second part of  ?                       duodenum. ?Findings: ?     ENDOSCOPIC FINDING: : ?     The examined esophagus was endoscopically normal. ?     A few small sessile polyps were found in the gastric fundus. ?     A previously placed metal stent was seen at the major papilla. ?     The exam of the duodenum was otherwise normal. ?     ENDOSONOGRAPHIC FINDING: : ?     An irregular mass was identified in the peripancreatic head region/porta  ?     hepatis region. The mass was hypoechoic. The mass measured 36 mm by 19  ?     mm in maximal cross-sectional diameter. The endosonographic borders were  ?     well-defined. There was sonographic evidence suggesting invasion into  ?     the portal vein (manifested by abutment) and the splenoportal confluence  ?     (manifested by abutment). Fine needle biopsy was performed. Color  ?     Doppler imaging was utilized prior to needle puncture to confirm a lack  ?     of significant vascular structures within the needle path. Four passes  ?     were made with the 25 gauge Medtronic SharkCore biopsy needle using a  ?     transgastric approach. The cellularity of the specimen was adequate.  ?     Final cytology results are pending. ?     One metal stent and significant air artifact was visualized  ?     endosonographically in the common bile duct. Extension of the stent was  ?     noted in the common hepatic duct. ?     There was no sign of significant endosonographic parenchymal or ductal  ?     abnormality in the genu of the pancreas,  pancreatic body and pancreatic  ?     tail. The PD measured 1.6 mm in the neck, 1.0 mm in the body, and 0.5 mm  ?     in the tail. The duct was not well seen in the head due to shadowing  ?     artifact from the metal bile duct stent. ?     Endosonographic imaging in the left lobe of the liver showed no mass  ?     lesions. ?     A few malignant-appearing lymph nodes were visualized in the  ?     peripancreatic region and porta hepatis  region. The largest measured 16  ?     mm by 11 mm in maximal cross-sectional diameter. The nodes were round,  ?     hypoechoic and had well defined margins. ?     The celiac region was visualized and showed no sign of significant  ?     endosonographic abnormality. ?Impression:            EGD Impressions: ?                       - Normal esophagus. ?                       - A few gastric polyps. Consistent in appearance with  ?                       benign fundic gland polyps. ?                       - Metal bile duct stent emerging from the major  ?                       ampulla. Only a small portion of the stent was visible  ?                       at the ampulla. ?                       EUS Impressions: ?                       - A mass was identified in the peripancreatic  ?                       head/porta hepatis region. Cytology results are  ?                       pending. However, the endosonographic appearance is  ?                       highly suspicious for adenocarcinoma. Fine needle  ?                       biopsy performed. The mass did not appear to arise  ?                       from the pancreas head as evidence by normal caliber  ?                       PD.  The location is more consistent with a primary  ?                       cholangiocarcinoma. ?                       - One metal stent and associated significant air  ?                       artifact was visualized endosonographically in the  ?                       common bile duct. ?                       - There was no sign of significant pathology in the  ?                       genu of the pancreas, pancreatic body and pancreatic  ?                       tail. ?                       - A few malignant-appearing lymph nodes were  ?                       visualized in the peripancreatic region and porta  ?                       hepatis region. ?                       - Normal celiac region. ?Recommendation:        - Discharge patient to home  (ambulatory). ?                       - Await cytology results. ?                       - The findings and recommendations were discussed with  ?                       the patient. ?

## 2022-02-17 ENCOUNTER — Encounter: Payer: Self-pay | Admitting: Internal Medicine

## 2022-02-20 ENCOUNTER — Other Ambulatory Visit: Payer: Self-pay | Admitting: General Surgery

## 2022-02-21 ENCOUNTER — Inpatient Hospital Stay: Payer: Medicare Other | Attending: Internal Medicine

## 2022-02-21 ENCOUNTER — Encounter: Payer: Self-pay | Admitting: Internal Medicine

## 2022-02-21 ENCOUNTER — Inpatient Hospital Stay (HOSPITAL_BASED_OUTPATIENT_CLINIC_OR_DEPARTMENT_OTHER): Payer: Medicare Other | Admitting: Internal Medicine

## 2022-02-21 DIAGNOSIS — C221 Intrahepatic bile duct carcinoma: Secondary | ICD-10-CM | POA: Insufficient documentation

## 2022-02-21 DIAGNOSIS — Z5112 Encounter for antineoplastic immunotherapy: Secondary | ICD-10-CM | POA: Insufficient documentation

## 2022-02-21 DIAGNOSIS — Z5111 Encounter for antineoplastic chemotherapy: Secondary | ICD-10-CM | POA: Insufficient documentation

## 2022-02-21 DIAGNOSIS — Z87891 Personal history of nicotine dependence: Secondary | ICD-10-CM | POA: Insufficient documentation

## 2022-02-21 DIAGNOSIS — Z9049 Acquired absence of other specified parts of digestive tract: Secondary | ICD-10-CM | POA: Diagnosis not present

## 2022-02-21 DIAGNOSIS — R5383 Other fatigue: Secondary | ICD-10-CM

## 2022-02-21 DIAGNOSIS — Z79899 Other long term (current) drug therapy: Secondary | ICD-10-CM | POA: Diagnosis not present

## 2022-02-21 DIAGNOSIS — E876 Hypokalemia: Secondary | ICD-10-CM | POA: Diagnosis not present

## 2022-02-21 DIAGNOSIS — Z5189 Encounter for other specified aftercare: Secondary | ICD-10-CM | POA: Diagnosis not present

## 2022-02-21 DIAGNOSIS — Z7982 Long term (current) use of aspirin: Secondary | ICD-10-CM | POA: Diagnosis not present

## 2022-02-21 DIAGNOSIS — K831 Obstruction of bile duct: Secondary | ICD-10-CM | POA: Diagnosis not present

## 2022-02-21 LAB — COMPREHENSIVE METABOLIC PANEL
ALT: 16 U/L (ref 0–44)
AST: 20 U/L (ref 15–41)
Albumin: 3.5 g/dL (ref 3.5–5.0)
Alkaline Phosphatase: 229 U/L — ABNORMAL HIGH (ref 38–126)
Anion gap: 10 (ref 5–15)
BUN: 11 mg/dL (ref 8–23)
CO2: 28 mmol/L (ref 22–32)
Calcium: 8.8 mg/dL — ABNORMAL LOW (ref 8.9–10.3)
Chloride: 96 mmol/L — ABNORMAL LOW (ref 98–111)
Creatinine, Ser: 0.91 mg/dL (ref 0.44–1.00)
GFR, Estimated: >60 mL/min (ref >60)
Glucose, Bld: 113 mg/dL — ABNORMAL HIGH (ref 70–99)
Potassium: 3.2 mmol/L — ABNORMAL LOW (ref 3.5–5.1)
Sodium: 134 mmol/L — ABNORMAL LOW (ref 135–145)
Total Bilirubin: 0.7 mg/dL (ref 0.3–1.2)
Total Protein: 8 g/dL (ref 6.5–8.1)

## 2022-02-21 LAB — CBC WITH DIFFERENTIAL/PLATELET
Abs Immature Granulocytes: 0.12 10*3/uL — ABNORMAL HIGH (ref 0.00–0.07)
Basophils Absolute: 0.1 10*3/uL (ref 0.0–0.1)
Basophils Relative: 1 %
Eosinophils Absolute: 0.2 10*3/uL (ref 0.0–0.5)
Eosinophils Relative: 2 %
HCT: 39.3 % (ref 36.0–46.0)
Hemoglobin: 12.7 g/dL (ref 12.0–15.0)
Immature Granulocytes: 1 %
Lymphocytes Relative: 23 %
Lymphs Abs: 2.2 10*3/uL (ref 0.7–4.0)
MCH: 29.1 pg (ref 26.0–34.0)
MCHC: 32.3 g/dL (ref 30.0–36.0)
MCV: 89.9 fL (ref 80.0–100.0)
Monocytes Absolute: 1.1 10*3/uL — ABNORMAL HIGH (ref 0.1–1.0)
Monocytes Relative: 12 %
Neutro Abs: 5.9 10*3/uL (ref 1.7–7.7)
Neutrophils Relative %: 61 %
Platelets: 311 10*3/uL (ref 150–400)
RBC: 4.37 MIL/uL (ref 3.87–5.11)
RDW: 12.3 % (ref 11.5–15.5)
WBC: 9.6 10*3/uL (ref 4.0–10.5)
nRBC: 0 % (ref 0.0–0.2)

## 2022-02-21 LAB — TSH: TSH: 1.481 u[IU]/mL (ref 0.350–4.500)

## 2022-02-21 MED ORDER — POTASSIUM CHLORIDE CRYS ER 20 MEQ PO TBCR: EXTENDED_RELEASE_TABLET | ORAL | 1 refills | Status: AC

## 2022-02-21 MED ORDER — LIDOCAINE-PRILOCAINE 2.5-2.5 % EX CREA: TOPICAL_CREAM | CUTANEOUS | 3 refills | Status: AC

## 2022-02-21 MED ORDER — ONDANSETRON HCL 8 MG PO TABS: ORAL_TABLET | ORAL | 1 refills | Status: AC

## 2022-02-21 MED ORDER — PROCHLORPERAZINE MALEATE 10 MG PO TABS: 10.0000 mg | ORAL_TABLET | Freq: Four times a day (QID) | ORAL | 1 refills | Status: AC | PRN

## 2022-02-21 NOTE — Progress Notes (Signed)
[place] ?OFFICE PROGRESS NOTE ? ?Patient Care Team: ?Baxter Hire, MD as PCP - General (Internal Medicine) ?Birder Robson, MD as Referring Physician (Ophthalmology) ?Isaias Cowman, MD as Consulting Physician (Cardiology) ?Ottie Glazier, MD as Consulting Physician (Pulmonary Disease) ? ? 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 ? ? ?Oncology History Overview Note  ?IMPRESSION: ?Technically limited examination due to extensive respiratory motion ?artifact. Despite this, enhancing, infiltrative mass centered within ?the central left hepatic lobe amputate the biliary tree of the ?segments 2 and 3 and abuts the confluence of the hepatic ducts and ?is most in keeping with an infiltrative cholangiocarcinoma. This ?measures approximately 3.5 x 4.5 cm in greatest dimension. Extensive ?associated pathologic hilar and periportal lymphadenopathy results ?in subsequent obstruction of the mid common duct secondary to ?extrinsic compression. ?  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 ?  ?Pathologic remote retroperitoneal adenopathy within the aortocaval ?and left periaortic lymph node groups. ?  ?Correlation with endoscopic ultrasound would be helpful for further ?evaluation ?  ?  ?Electronically Signed ?  By: Fidela Salisbury M.D. ?  On: 01/28/2022 03:29 ? ?IMPRESSION: ?1. Hypermetabolic left hepatic lobe cholangiocarcinoma with ?metastatic adenopathy in the chest and abdomen. ?2. Hypermetabolic right adrenal nodule worrisome for a metastasis. ?3. Small pericardial effusion, minimally increased from 01/29/2022. ?4. Left adrenal adenoma.  No follow-up necessary. ?5. Punctate right renal stones. ?6. Aortic  atherosclerosis (ICD10-I70.0). Coronary artery ?calcification. ?  ?  ?Electronically Signed ?  By: Lorin Picket M.D. ?  On: 02/15/2022 09:13 ? ?DIAGNOSIS:  ?A. PERIPANCREATIC MASS; EUS-ASSISTED FNA:  ?- POSITIVE FOR MALIGNANCY.  ?- ADENOCARCINOMA IS PRESENT.  ? ?There is insufficient material for ancillary molecular testing.  ? ?The specimen is adequate for interpretation.  ?  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 ?  ?02/21/2022 Cancer Staging  ? Staging form: Intrahepatic Bile Duct, AJCC 8th Edition ?- Clinical: Stage IV (cT1a, cN1, cM1) - Signed by Cammie Sickle, MD on 02/21/2022 ?Stage prefix: Initial diagnosis ? ?  ?03/01/2022 -  Chemotherapy  ? Patient is on Treatment Plan : BLADDER Gemcitabine D1,8 + Cisplatin (split dose) D1,8 q21d x 4 cycles  ? ?  ?  ? ?  ? ?INTERVAL HISTORY: Ambulating independently.  Accompanied by her daughters. ? ?Marcia Livingston 76 y.o.  female pleasant patient above history of  has been referred to [place] for further evaluation/ recommendations for newly diagnosed liver mass/biopsy/PET scan. ? ?The patient was recently admitted to the hospital for obstructive jaundice.  CT scan showed-large liver lesion which was further confirmed on MRI of the liver contrast.  Patient underwent ERCP with biliary stenting.  Patient was discharged home.  Patient subsequently underwent a PET scan; also underwent endoscopic ultrasound with biopsy. ? ?Patient complains of poor appetite.  Otherwise denies any nausea vomiting.  No fever no chills. ? ?Review of Systems  ?Constitutional:  Positive for malaise/fatigue and weight loss. Negative for chills, diaphoresis and fever.  ?HENT:  Negative for nosebleeds and sore throat.   ?Eyes:  Negative for double vision.  ?Respiratory:  Negative for cough, hemoptysis, sputum production, shortness of breath and wheezing.   ?Cardiovascular:  Negative for chest pain, palpitations,  orthopnea and leg swelling.   ?Gastrointestinal:  Negative for abdominal pain, blood in stool, constipation, diarrhea, heartburn, melena, nausea and vomiting.  ?Genitourinary:  Negative for dysuria, frequency and urgency.  ?Musculoskeletal:  Positive for back pain and joint pain.  ?Skin: Negative.  Negative for itching and rash.  ?Neurological:  Negative for dizziness, tingling, focal weakness, weakness and headaches.  ?Endo/Heme/Allergies:  Does not bruise/bleed easily.  ?Psychiatric/Behavioral:  Negative for depression. The patient is not nervous/anxious and does not have insomnia.   ?  ? ?PAST MEDICAL HISTORY :  ?Past Medical History:  ?Diagnosis Date  ? Anxiety   ? Arthritis   ? CAD (coronary artery disease)   ? Cancer [facility])   ? SKIN  ? Colon polyps   ? COPD (chronic obstructive pulmonary disease) ([place])   ? Depression   ? Depression   ? Fibrocystic breast disease   ? GERD (gastroesophageal reflux disease)   ? Heart murmur   ? High cholesterol   ? Hyperlipidemia   ? Hypertension   ? IBS (irritable bowel syndrome)   ? IBS (irritable bowel syndrome)   ? Obesity   ? Osteoporosis   ? Overactive bladder   ? Sleep apnea   ? Sleep apnea   ? ? ?PAST SURGICAL HISTORY :   ?Past Surgical History:  ?Procedure Laterality Date  ? ABDOMINAL HYSTERECTOMY    ? total  ? APPENDECTOMY    ? BREAST EXCISIONAL BIOPSY Right YRS AGO  ? NEG  ? CATARACT EXTRACTION W/PHACO Right 02/01/2016  ? Procedure: CATARACT EXTRACTION PHACO AND INTRAOCULAR LENS PLACEMENT (IOC);  Surgeon: Birder Robson, MD;  Location: [facility] ORS;  Service: Ophthalmology;  Laterality: Right;  [place]    00:37.2 ?AP%  20.7% ?CDE  7.69 ?fluid pack lot # [id number]  ? CATARACT EXTRACTION W/PHACO Left 02/22/2016  ? Procedure: CATARACT EXTRACTION PHACO AND INTRAOCULAR LENS PLACEMENT (IOC);  Surgeon: Birder Robson, MD;  Location: [facility] ORS;  Service: Ophthalmology;  Laterality: Left;  [place] 00:40 ?AP% 19.9 ?CDE 8.06 ?fluid pack lot # [id number]  ? CHOLECYSTECTOMY    ? COLONOSCOPY WITH PROPOFOL N/A 01/13/2016  ?  Procedure: COLONOSCOPY WITH PROPOFOL;  Surgeon: Manya Silvas, MD;  Location[facility] ENDOSCOPY;  Service: Endoscopy;  Laterality: N/A;  ? COLONOSCOPY WITH PROPOFOL N/A 12/14/2021  ? Procedure: COLONOSCOPY WITH PROPOFOL;  Surgeon: Toledo, Benay Pike, MD;  Location: [facility] ENDOSCOPY;  Service: Gastroenterology;  Laterality: N/A;  ? ERCP N/A 01/31/2022  ? Procedure: ENDOSCOPIC RETROGRADE CHOLANGIOPANCREATOGRAPHY (ERCP);  Surgeon: Lucilla Lame, MD;  Location: [facility] ENDOSCOPY;  Service: Endoscopy;  Laterality: N/A;  ? EUS N/A 02/16/2022  ? Procedure: UPPER ENDOSCOPIC ULTRASOUND (EUS) LINEAR;  Surgeon: Holly Bodily, MD;  Location: [facility] ENDOSCOPY;  Service: Gastroenterology;  Laterality: N/A;  [facility]  ? EYE SURGERY    ? FOOT SURGERY Left   ? for breakdown of arch  ? OVARIAN CYST SURGERY    ? 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 Right [place]  ? ? ?FAMILY HISTORY :   ?Family History  ?Problem Relation Age of Onset  ? Colon cancer Mother   ?     thought to be mets from breast cancer  ? Stroke Mother   ? Hypertension Mother   ? Heart attack Mother   ? Breast cancer Mother 21  ? Emphysema Father   ? Prostate cancer Father   ? Breast cancer Maternal Aunt   ?     18's  ? Throat cancer Daughter   ? Rheum  arthritis Daughter   ? Hypertension Daughter   ? Reye's syndrome Daughter   ?     history of  ? Hepatitis C Daughter   ? Ovarian cancer Neg Hx   ? ? ?SOCIAL HISTORY:   ?Social History  ? ?Tobacco Use  ? Smoking status: Former  ?  Packs/day: 1.00  ?  Years: 30.00  ?  Pack years: 30.00  ?  Types: Cigarettes  ?  Quit date: 10/15/1996  ?  Years since quitting: 25.3  ? Smokeless tobacco: Never  ?Vaping Use  ? Vaping Use: Never used  ?Substance Use Topics  ? Alcohol use: Yes  ?  Comment: wine rarely  ? Drug use: No  ? ? ?ALLERGIES:  is allergic to penicillins, codeine, and sulfa antibiotics. ? ?MEDICATIONS:  ?Current Outpatient Medications  ?Medication Sig Dispense Refill  ? ALPRAZolam (XANAX) 0.5 MG tablet Take 1 tablet (0.5 mg  total) by mouth 2 (two) times daily as needed for anxiety. 15 tablet 0  ? aspirin EC 81 MG tablet Take 81 mg by mouth daily.    ? buPROPion (WELLBUTRIN XL) 150 MG 24 hr tablet TAKE 1 TABLET BY MOUTH  DAIL

## 2022-02-21 NOTE — Progress Notes (Signed)
Guardant 360 CDx liquid biopsy sent. ?

## 2022-02-21 NOTE — Assessment & Plan Note (Addendum)
#  Cholangiocarcinoma stage IV-insufficient tissue for NGS; possibly MMR testing.  Check liquid biopsy guardant testing today. ? ?#I reviewed the pathology and stage of the patient; and family in detail.  Discussed that unfortunately patient's malignancy is incurable.  I would not recommend surgery.  I would recommend palliative chemotherapy-immunotherapy.  Discussed the median survival is in the order of 12 months also. ? ?#Discussed regarding use of gemcitabine-Cisplatin [split dose] with Durvalumab/Immunotherapy.  ? ?Discussed the potential side effects including but not limited to-increasing fatigue, nausea vomiting, diarrhea, hair loss, sores in the mouth, increase risk of infection and also neuropathy.  We will check TSH.  Discussed that patient has significant issues/side effects from chemotherapy-that would recommend alternative chemotherapy-FOLFOX versus others.  Also await liquid biopsy-which also given history of other treatment options. ? ?#Obstructive jaundice status post ERCP status post stenting; LFTs/bilirubin improved.  Monitor closely. ? ?#Hypokalemia potassium 3.2: Recommend potassium supplementation.  ? ?# Chemotherapy education; port placement- Dr.Byrnett. Hopefully the planned start chemotherapy next week. Antiemetics-Zofran and Compazine; EMLA cream sent to pharmacy. ? ?#Patient planning to move to [place] middle of June; will make a referral to oncology. Discussed with Mariea Clonts.  ? ?# DISPOSITION: ?# chemo education ASAP- cisplatin-Gem- Durvalumab ?# labs today- cbc/cmp/ca-19-9; TSH ?# follow up in appx 1 week- MD; labs- cbc/cmp;cisplatin-Gem- Durvalumab- Dr.B ? ?# I reviewed the blood work- with the patient in detail; also reviewed the imaging independently [as summarized above]; and with the patient in detail.  ? ?

## 2022-02-21 NOTE — Progress Notes (Signed)
START OFF PATHWAY REGIMEN - Other ° ° °[id number]:Cisplatin IV D1,8 + Durvalumab 1,500 mg IV D1 + Gemcitabine IV D1,8 q21 Days for up to 8 Cycles Followed by Durvalumab 1,500 mg IV D1 q28 Days: °  Cycles 1 through up to 8: A cycle is every 21 days: °    Durvalumab  °    Gemcitabine  °    Cisplatin  °  Cycles 9 and beyond: A cycle is every 28 days: °    Durvalumab  ° °**Always confirm dose/schedule in your pharmacy ordering system** ° °Patient Characteristics: °Intent of Therapy: °Non-Curative / Palliative Intent, Discussed with Patient °

## 2022-02-22 ENCOUNTER — Other Ambulatory Visit: Payer: Self-pay

## 2022-02-22 ENCOUNTER — Other Ambulatory Visit
Admission: RE | Admit: 2022-02-22 | Discharge: 2022-02-22 | Disposition: A | Discharge status: Home / Self Care | Payer: Medicare Other | Source: Ambulatory Visit | Attending: General Surgery | Admitting: General Surgery

## 2022-02-22 HISTORY — DX: Myoneural disorder, unspecified: G70.9

## 2022-02-22 LAB — CANCER ANTIGEN 19-9: CA 19-9: 5222 U/mL — ABNORMAL HIGH (ref 0–35)

## 2022-02-22 NOTE — Patient Instructions (Addendum)
Your procedure is scheduled on: 02/24/22 - Friday ?Report to the Registration Desk on the 1st floor of the [facility]. ?To find out your arrival time, please call [phone] between 1PM - 3PM on: 02/23/22  ?If your arrival time is 6:00 am, do not arrive prior to that time as the [facility] entrance doors do not open until 6:00 am. ? ?REMEMBER: ?Instructions that are not followed completely may result in serious medical risk, up to and including death; or upon the discretion of your surgeon and anesthesiologist your surgery may need to be rescheduled. ? ?Do not eat food after midnight the night before surgery.  ?No gum chewing, lozengers or hard candies. ? ?You may however, drink CLEAR liquids up to 2 hours before you are scheduled to arrive for your surgery. Do not drink anything within 2 hours of your scheduled arrival time. ? ?Clear liquids include: ?- water  ?- apple juice without pulp ?- gatorade (not RED colors) ?- black coffee or tea (Do NOT add milk or creamers to the coffee or tea) ?Do NOT drink anything that is not on this list. ? ?TAKE THESE MEDICATIONS THE MORNING OF SURGERY WITH A SIP OF WATER: ? ?- DULoxetine (CYMBALTA) 60 MG capsule ?- gabapentin (NEURONTIN) 300 MG capsule ?- potassium chloride SA (KLOR-CON M) 20 MEQ tablet ?- colestipol (COLESTID) 1 g tablet ?- busPIRone (BUSPAR) 15 MG tablet ?- buPROPion (WELLBUTRIN XL) 150 MG 24 hr tablet ?- ALPRAZolam (XANAX) 0.5 MG tablet if needed ? ?One week prior to surgery: ?Stop Anti-inflammatories (NSAIDS) such as Advil, Aleve, Ibuprofen, Motrin, Naproxen, Naprosyn and Aspirin based products such as Excedrin, Goodys Powder, BC Powder. ? ?Stop ANY OVER THE COUNTER supplements until after surgery. ? ?You may however, continue to take Tylenol if needed for pain up until the day of surgery. ? ?No Alcohol for 24 hours before or after surgery. ? ?No Smoking including e-cigarettes for 24 hours prior to surgery.  ?No chewable tobacco products for at least 6  hours prior to surgery.  ?No nicotine patches on the day of surgery. ? ?Do not use any "recreational" drugs for at least a week prior to your surgery.  ?Please be advised that the combination of cocaine and anesthesia may have negative outcomes, up to and including death. ?If you test positive for cocaine, your surgery will be cancelled. ? ?On the morning of surgery brush your teeth with toothpaste and water, you may rinse your mouth with mouthwash if you wish. ?Do not swallow any toothpaste or mouthwash. ? ?Use CHG Soap or wipes as directed on instruction sheet. ? ?Do not wear jewelry, make-up, hairpins, clips or nail polish. ? ?Do not wear lotions, powders, or perfumes.  ? ?Do not shave body from the neck down 48 hours prior to surgery just in case you cut yourself which could leave a site for infection.  ?Also, freshly shaved skin may become irritated if using the CHG soap. ? ?Contact lenses, hearing aids and dentures may not be worn into surgery. ? ?Do not bring valuables to the hospital. [facility] is not responsible for any missing/lost belongings or valuables.  ? ?Notify your doctor if there is any change in your medical condition (cold, fever, infection). ? ?Wear comfortable clothing (specific to your surgery type) to the hospital. ? ?After surgery, you can help prevent lung complications by doing breathing exercises.  ?Take deep breaths and cough every 1-2 hours. Your doctor may order a device called an Incentive Spirometer to help  you take deep breaths. ?When coughing or sneezing, hold a pillow firmly against your incision with both hands. This is called ?splinting.? Doing this helps protect your incision. It also decreases belly discomfort. ? ?If you are being admitted to the hospital overnight, leave your suitcase in the car. ?After surgery it may be brought to your room. ? ?If you are being discharged the day of surgery, you will not be allowed to drive home. ?You will need a responsible adult (18  years or older) to drive you home and stay with you that night.  ? ?If you are taking public transportation, you will need to have a responsible adult (18 years or older) with you. ?Please confirm with your physician that it is acceptable to use public transportation.  ? ?Please call the [facility] Dept. at [phone] if you have any questions about these instructions. ? ?Surgery Visitation Policy: ? ?Patients undergoing a surgery or procedure may have two family members or support persons with them as long as the person is not COVID-19 positive or experiencing its symptoms.  ? ?Inpatient Visitation:   ? ?Visiting hours are 7 a.m. to 8 p.m. ?Up to four visitors are allowed at one time in a patient room, including children. The visitors may rotate out with other people during the day. One designated support person (adult) may remain overnight.  ?

## 2022-02-23 ENCOUNTER — Other Ambulatory Visit: Payer: Self-pay | Admitting: General Surgery

## 2022-02-23 NOTE — Progress Notes (Unsigned)
Subjective:  ?  ? Patient ID: Marcia Livingston is a 76 y.o. female. ?  ?HPI ?  ?The following portions of the patient's history were reviewed and updated as appropriate. ?  ?This a new patient is here today for: office visit. The patient is here today to discuss port placement. Patient was referred by Dr. Rogue Bussing.  ?  ?The patient is accompanied by her daughter, Annamary Carolin.  ?  ?The patient will be moving back to [place] after initiation of treatment to be closer to family. ?  ?   ?Chief Complaint  ?Patient presents with  ? Pre-op Exam  ?  ?  ?BP (!) 172/70   Pulse 86   Temp 36.3 ?C (97.4 ?F)   Ht 154.9 cm ('5\' 1"'$ )   Wt 88.9 kg (196 lb)   SpO2 96%   BMI 37.03 kg/m?  ?  ?    ?Past Medical History:  ?Diagnosis Date  ? Allergy    ? Anxiety    ? Arthritis    ? CAD (coronary artery disease)    ? Carotid artery occlusion    ? 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 Colon polyps    ? COPD (chronic obstructive pulmonary disease) (CMS-HCC)    ?  mild  ? Depression    ? Diabetes mellitus without complication (CMS-HCC)    ?  pre-diabetes  ? Family history of colon cancer    ? Fibrocystic breast disease    ? GERD (gastroesophageal reflux disease)    ? H/O mammogram 03/21/2013  ? Heart murmur, unspecified    ?  aortic  ? History of bone density study 08/09/2011  ? Hyperlipidemia    ? Hypertension    ? IBS (irritable bowel syndrome)    ? Low vitamin D level    ? Obesity    ? Osteoporosis    ? Overactive bladder    ? Skin cancer    ? Sleep apnea    ?  With hypersomnolence.  ? Urinary incontinence    ?  ?  ?     ?Past Surgical History:  ?Procedure Laterality Date  ? APPENDECTOMY   1974  ? Ovarian cystectomy Right 1974  ? TAH/BSO   1984  ?  for ovarian cyst and endometriosis  ? COLONOSCOPY   05/09/2000  ?  Adenomatous Polyp, [facility] (Mother)  ? COLONOSCOPY   12/15/2009  ?  Adenomatous Polyps, [facility] (Mother): CBF 12/2014; recall ltr mailed 10/23/2014 (dw)  ? CATARACT EXTRACTION   2017  ? COLONOSCOPY   01/13/2016  ?  Adenomatous  Polyps, [facility] (Mother): CBF 12/2020  ? skin cancer excision   05/16/2017  ? COLONOSCOPY   12/14/2021  ?  Tubular adenoma/PHx CP/Repeat 51yr/TKT  ? ERCP   01/31/2022  ? BREAST EXCISIONAL BIOPSY Right    ?  SCC  ? BTL      ? CHOLECYSTECTOMY      ? CHOLECYSTECTOMY      ? COLONOSCOPY   05/21/2008, 10/17/2002  ?  PH Adenomatous Polyp, [facility] (Mother)  ? Foot surgery Left    ?  correction of multiple deformities  ? HYSTERECTOMY      ? TUBAL LIGATION      ?  ?  ?  ?        ?OB History   ?  Gravida  ?2  ? Para  ?2  ? Term  ?   ? Preterm  ?   ? AB  ?   ?  Living  ?   ?  ?  SAB  ?   ? IAB  ?   ? Ectopic  ?   ? Molar  ?   ? Multiple  ?   ? Live Births  ?   ?  ?  ?  Obstetric Comments  ?Age at first period 16 ?Age of first pregnancy 70 ?   ?  ?   ?  ?  ?Social History  ?  ?  ?     ?Socioeconomic History  ? Marital status: Married  ?Tobacco Use  ? Smoking status: Former  ?    Packs/day: 1.00  ?    Years: 30.00  ?    Pack years: 30.00  ?    Types: Cigarettes  ?    Quit date: 04/16/1996  ?    Years since quitting: 25.8  ? Smokeless tobacco: Never  ?Vaping Use  ? Vaping Use: Never used  ?Substance and Sexual Activity  ? Alcohol use: Not Currently  ?    Alcohol/week: 1.0 standard drink  ?    Types: 1 Glasses of wine per week  ?    Comment: rarely a glass of wine  ? Drug use: No  ? Sexual activity: Not Currently  ?    Partners: Male  ?    Birth control/protection: None  ?Social History Narrative  ?  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  Housewife  ?  Former smoker 1ppd for 25 yrs; quit 1997  ?  Denies etoh/drug use  ?  Exercise: minimal  ?  ?  ?  ?    ?Allergies  ?Allergen Reactions  ? Codeine Itching  ? Penicillin G Rash  ? Sulfa (Sulfonamide Antibiotics) Rash  ?  ?  ?Current Medications  ?      ?Current Outpatient Medications  ?Medication Sig Dispense Refill  ? aspirin 81 MG EC tablet Take 1 tablet (81 mg total) by mouth once daily      ? buPROPion (WELLBUTRIN XL) 150 MG XL tablet Take 1 tablet (150 mg  total) by mouth once daily (Patient taking differently: Take 150 mg by mouth once daily Taking three times as needed.) 90 tablet 1  ? busPIRone (BUSPAR) 15 MG tablet Take 1 tablet (15 mg total) by mouth 2 (two) times daily 180 tablet 1  ? cetirizine (ZYRTEC) 10 MG tablet Take 10 mg by mouth once daily      ? cholecalciferol (VITAMIN D3) 1000 unit tablet Take by mouth.      ? cyanocobalamin, vitamin B-12, 5,000 mcg TbDL Take by mouth      ? DULoxetine (CYMBALTA) 60 MG DR capsule Take 1 capsule (60 mg total) by mouth once daily 90 capsule 1  ? fluticasone-umeclidinium-vilanterol (TRELEGY ELLIPTA) 100-62.5-25 mcg inhaler Inhale 1 inhalation into the lungs once daily 3 each 1  ? gabapentin (NEURONTIN) 300 MG capsule Take 1 capsule (300 mg total) by mouth at bedtime 90 capsule 1  ? hydroCHLOROthiazide (HYDRODIURIL) 25 MG tablet Take 1 tablet (25 mg total) by mouth once daily 90 tablet 1  ? losartan (COZAAR) 50 MG tablet Take 1 tablet (50 mg total) by mouth once daily 90 tablet 1  ? nystatin (MYCOSTATIN) 100,000 unit/gram powder Apply topically 2 (two) times daily 30 g 0  ? oxyCODONE (ROXICODONE) 5 MG immediate release tablet Take 5 mg by mouth every 8 (eight) hours as needed      ?  potassium chloride (KLOR-CON) 20 MEQ ER tablet 1 pill twice a day      ? rosuvastatin (CRESTOR) 5 MG tablet Take 1 tablet (5 mg total) by mouth once daily 90 tablet 1  ? traZODone (DESYREL) 50 MG tablet Take 2 tablets (100 mg total) by mouth at bedtime 180 tablet 1  ? vitamin D3-folic acid 093 unit- 1 mg Tab Take by mouth      ? albuterol 90 mcg/actuation inhaler Inhale 2 inhalations into the lungs every 6 (six) hours as needed 8 g 0  ? albuterol 90 mcg/actuation inhaler 2 puffs q.i.d. p.r.n. short of breath, wheezing, or cough (Patient not taking: Reported on 01/24/2022) 6.7 g 0  ? brompheniramine-pseudoephed-DM (BROMFED DM) 2-30-10 mg/5 mL syrup Take 5 mLs by mouth every 6 (six) hours as needed (Patient not taking: Reported on 01/13/2022) 118  mL 0  ? colestipol (COLESTID) 1 gram tablet Take 2 tablets (2 g total) by mouth 2 (two) times daily 360 tablet 1  ? FUROsemide (LASIX) 20 MG tablet Take 1 tablet (20 mg total) by mouth once daily as needed for Edema (Patient not taking: Reported on 08/02/2021) 30 tablet 11  ? meclizine (ANTIVERT) 25 mg tablet TAKE 1 TABLET BY MOUTH 3  TIMES DAILY AS NEEDED FOR  DIZZINESS FOR UP TO 10  DAYS. (Patient not taking: Reported on 01/24/2022) 270 tablet 1  ?  ?No current facility-administered medications for this visit.  ?  ?  ?  ?     ?Family History  ?Problem Relation Age of Onset  ? Colon cancer Mother    ? Myocardial Infarction (Heart attack) Mother    ?      severe CAD, MI; s/p CABG  ? Stroke Mother    ? High blood pressure (Hypertension) Mother    ? Hyperlipidemia (Elevated cholesterol) Mother    ? Depression Mother    ? Colon polyps Mother    ? Breast cancer Mother    ? Coronary Artery Disease (Blocked arteries around heart) Mother    ?      CABG  ? Diabetes type II Mother    ? Emphysema Father    ?      smoker  ? Prostate cancer Father    ? Alcohol abuse Daughter    ?      deceased  ? Anxiety Daughter    ? Bipolar disorder Daughter    ? Depression Daughter    ? Throat cancer Daughter 50  ?      smoker  ? Hepatitis C Daughter    ?      chronic; s/p transfusion as a child  ? Rheum arthritis Daughter    ? Breast cancer Maternal Aunt 80  ? Colon cancer Maternal Uncle 76  ? Ovarian cancer Neg Hx    ?  ?  ?  ?Labs and Radiology:  ?  ?Feb 16, 2022 laboratory: ?WBC 4.0 - 10.5 K/uL 9.6   ?RBC 3.87 - 5.11 MIL/uL 4.37   ?Hemoglobin 12.0 - 15.0 g/dL 12.7   ?HCT 36.0 - 46.0 % 39.3   ?MCV 80.0 - 100.0 fL 89.9   ?MCH 26.0 - 34.0 pg 29.1   ?MCHC 30.0 - 36.0 g/dL 32.3   ?RDW 11.5 - 15.5 % 12.3   ?Platelets 150 - 400 K/uL 311   ?nRBC 0.0 - 0.2 % 0.0   ?Neutrophils Relative % % 61   ?Neutro Abs 1.7 - 7.7 K/uL 5.9   ?Lymphocytes Relative %  23   ?Lymphs Abs: 0.7 - 4.0 K/uL 2.2   ?Monocytes Relative % 12   ?Monocytes Absolute 0.1 - 1.0  K/uL 1.1 High    ?Eosinophils Relative % 2   ?Eosinophils Absolute 0.0 - 0.5 K/uL 0.2   ?Basophils Relative % 1   ?Basophils Absolute 0.0 - 0.1 K/uL 0.1   ?Immature Granulocytes % 1   ?Abs Immature Granulocytes 0

## 2022-02-23 NOTE — Progress Notes (Signed)
Subjective:  ?  ? Patient ID: Marcia Livingston is a 76 y.o. female. ?  ?HPI ?  ?The following portions of the patient's history were reviewed and updated as appropriate. ?  ?This a new patient is here today for: office visit. The patient is here today to discuss port placement. Patient was referred by Dr. Rogue Bussing.  ?  ?The patient is accompanied by her daughter, Annamary Carolin.  ?  ?The patient will be moving back to [place] after initiation of treatment to be closer to family. ?  ?   ?Chief Complaint  ?Patient presents with  ? Pre-op Exam  ?  ?  ?BP (!) 172/70   Pulse 86   Temp 36.3 ?C (97.4 ?F)   Ht 154.9 cm ('5\' 1"'$ )   Wt 88.9 kg (196 lb)   SpO2 96%   BMI 37.03 kg/m?  ?  ?    ?Past Medical History:  ?Diagnosis Date  ? Allergy    ? Anxiety    ? Arthritis    ? CAD (coronary artery disease)    ? Carotid artery occlusion    ? 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 Colon polyps    ? COPD (chronic obstructive pulmonary disease) (CMS-HCC)    ?  mild  ? Depression    ? Diabetes mellitus without complication (CMS-HCC)    ?  pre-diabetes  ? Family history of colon cancer    ? Fibrocystic breast disease    ? GERD (gastroesophageal reflux disease)    ? H/O mammogram 03/21/2013  ? Heart murmur, unspecified    ?  aortic  ? History of bone density study 08/09/2011  ? Hyperlipidemia    ? Hypertension    ? IBS (irritable bowel syndrome)    ? Low vitamin D level    ? Obesity    ? Osteoporosis    ? Overactive bladder    ? Skin cancer    ? Sleep apnea    ?  With hypersomnolence.  ? Urinary incontinence    ?  ?  ?     ?Past Surgical History:  ?Procedure Laterality Date  ? APPENDECTOMY   1974  ? Ovarian cystectomy Right 1974  ? TAH/BSO   1984  ?  for ovarian cyst and endometriosis  ? COLONOSCOPY   05/09/2000  ?  Adenomatous Polyp, [facility] (Mother)  ? COLONOSCOPY   12/15/2009  ?  Adenomatous Polyps, [facility] (Mother): CBF 12/2014; recall ltr mailed 10/23/2014 (dw)  ? CATARACT EXTRACTION   2017  ? COLONOSCOPY   01/13/2016  ?  Adenomatous  Polyps, [facility] (Mother): CBF 12/2020  ? skin cancer excision   05/16/2017  ? COLONOSCOPY   12/14/2021  ?  Tubular adenoma/PHx CP/Repeat 25yr/TKT  ? ERCP   01/31/2022  ? BREAST EXCISIONAL BIOPSY Right    ?  SCC  ? BTL      ? CHOLECYSTECTOMY      ? CHOLECYSTECTOMY      ? COLONOSCOPY   05/21/2008, 10/17/2002  ?  PH Adenomatous Polyp, [facility] (Mother)  ? Foot surgery Left    ?  correction of multiple deformities  ? HYSTERECTOMY      ? TUBAL LIGATION      ?  ?  ?  ?        ?OB History   ?  Gravida  ?2  ? Para  ?2  ? Term  ?   ? Preterm  ?   ? AB  ?   ?  Living  ?   ?  ?  SAB  ?   ? IAB  ?   ? Ectopic  ?   ? Molar  ?   ? Multiple  ?   ? Live Births  ?   ?  ?  ?  Obstetric Comments  ?Age at first period 55 ?Age of first pregnancy 65 ?   ?  ?   ?  ?  ?Social History  ?  ?  ?     ?Socioeconomic History  ? Marital status: Married  ?Tobacco Use  ? Smoking status: Former  ?    Packs/day: 1.00  ?    Years: 30.00  ?    Pack years: 30.00  ?    Types: Cigarettes  ?    Quit date: 04/16/1996  ?    Years since quitting: 25.8  ? Smokeless tobacco: Never  ?Vaping Use  ? Vaping Use: Never used  ?Substance and Sexual Activity  ? Alcohol use: Not Currently  ?    Alcohol/week: 1.0 standard drink  ?    Types: 1 Glasses of wine per week  ?    Comment: rarely a glass of wine  ? Drug use: No  ? Sexual activity: Not Currently  ?    Partners: Male  ?    Birth control/protection: None  ?Social History Narrative  ?  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  Housewife  ?  Former smoker 1ppd for 25 yrs; quit 1997  ?  Denies etoh/drug use  ?  Exercise: minimal  ?  ?  ?  ?    ?Allergies  ?Allergen Reactions  ? Codeine Itching  ? Penicillin G Rash  ? Sulfa (Sulfonamide Antibiotics) Rash  ?  ?  ?Current Medications  ?      ?Current Outpatient Medications  ?Medication Sig Dispense Refill  ? aspirin 81 MG EC tablet Take 1 tablet (81 mg total) by mouth once daily      ? buPROPion (WELLBUTRIN XL) 150 MG XL tablet Take 1 tablet (150 mg  total) by mouth once daily (Patient taking differently: Take 150 mg by mouth once daily Taking three times as needed.) 90 tablet 1  ? busPIRone (BUSPAR) 15 MG tablet Take 1 tablet (15 mg total) by mouth 2 (two) times daily 180 tablet 1  ? cetirizine (ZYRTEC) 10 MG tablet Take 10 mg by mouth once daily      ? cholecalciferol (VITAMIN D3) 1000 unit tablet Take by mouth.      ? cyanocobalamin, vitamin B-12, 5,000 mcg TbDL Take by mouth      ? DULoxetine (CYMBALTA) 60 MG DR capsule Take 1 capsule (60 mg total) by mouth once daily 90 capsule 1  ? fluticasone-umeclidinium-vilanterol (TRELEGY ELLIPTA) 100-62.5-25 mcg inhaler Inhale 1 inhalation into the lungs once daily 3 each 1  ? gabapentin (NEURONTIN) 300 MG capsule Take 1 capsule (300 mg total) by mouth at bedtime 90 capsule 1  ? hydroCHLOROthiazide (HYDRODIURIL) 25 MG tablet Take 1 tablet (25 mg total) by mouth once daily 90 tablet 1  ? losartan (COZAAR) 50 MG tablet Take 1 tablet (50 mg total) by mouth once daily 90 tablet 1  ? nystatin (MYCOSTATIN) 100,000 unit/gram powder Apply topically 2 (two) times daily 30 g 0  ? oxyCODONE (ROXICODONE) 5 MG immediate release tablet Take 5 mg by mouth every 8 (eight) hours as needed      ?  potassium chloride (KLOR-CON) 20 MEQ ER tablet 1 pill twice a day      ? rosuvastatin (CRESTOR) 5 MG tablet Take 1 tablet (5 mg total) by mouth once daily 90 tablet 1  ? traZODone (DESYREL) 50 MG tablet Take 2 tablets (100 mg total) by mouth at bedtime 180 tablet 1  ? vitamin D3-folic acid 448 unit- 1 mg Tab Take by mouth      ? albuterol 90 mcg/actuation inhaler Inhale 2 inhalations into the lungs every 6 (six) hours as needed 8 g 0  ? albuterol 90 mcg/actuation inhaler 2 puffs q.i.d. p.r.n. short of breath, wheezing, or cough (Patient not taking: Reported on 01/24/2022) 6.7 g 0  ? brompheniramine-pseudoephed-DM (BROMFED DM) 2-30-10 mg/5 mL syrup Take 5 mLs by mouth every 6 (six) hours as needed (Patient not taking: Reported on 01/13/2022) 118  mL 0  ? colestipol (COLESTID) 1 gram tablet Take 2 tablets (2 g total) by mouth 2 (two) times daily 360 tablet 1  ? FUROsemide (LASIX) 20 MG tablet Take 1 tablet (20 mg total) by mouth once daily as needed for Edema (Patient not taking: Reported on 08/02/2021) 30 tablet 11  ? meclizine (ANTIVERT) 25 mg tablet TAKE 1 TABLET BY MOUTH 3  TIMES DAILY AS NEEDED FOR  DIZZINESS FOR UP TO 10  DAYS. (Patient not taking: Reported on 01/24/2022) 270 tablet 1  ?  ?No current facility-administered medications for this visit.  ?  ?  ?  ?     ?Family History  ?Problem Relation Age of Onset  ? Colon cancer Mother    ? Myocardial Infarction (Heart attack) Mother    ?      severe CAD, MI; s/p CABG  ? Stroke Mother    ? High blood pressure (Hypertension) Mother    ? Hyperlipidemia (Elevated cholesterol) Mother    ? Depression Mother    ? Colon polyps Mother    ? Breast cancer Mother    ? Coronary Artery Disease (Blocked arteries around heart) Mother    ?      CABG  ? Diabetes type II Mother    ? Emphysema Father    ?      smoker  ? Prostate cancer Father    ? Alcohol abuse Daughter    ?      deceased  ? Anxiety Daughter    ? Bipolar disorder Daughter    ? Depression Daughter    ? Throat cancer Daughter 22  ?      smoker  ? Hepatitis C Daughter    ?      chronic; s/p transfusion as a child  ? Rheum arthritis Daughter    ? Breast cancer Maternal Aunt 80  ? Colon cancer Maternal Uncle 57  ? Ovarian cancer Neg Hx    ?  ?  ?  ?Labs and Radiology:  ?  ?Feb 16, 2022 laboratory: ?WBC 4.0 - 10.5 K/uL 9.6   ?RBC 3.87 - 5.11 MIL/uL 4.37   ?Hemoglobin 12.0 - 15.0 g/dL 12.7   ?HCT 36.0 - 46.0 % 39.3   ?MCV 80.0 - 100.0 fL 89.9   ?MCH 26.0 - 34.0 pg 29.1   ?MCHC 30.0 - 36.0 g/dL 32.3   ?RDW 11.5 - 15.5 % 12.3   ?Platelets 150 - 400 K/uL 311   ?nRBC 0.0 - 0.2 % 0.0   ?Neutrophils Relative % % 61   ?Neutro Abs 1.7 - 7.7 K/uL 5.9   ?Lymphocytes Relative %  23   ?Lymphs Abs: 0.7 - 4.0 K/uL 2.2   ?Monocytes Relative % 12   ?Monocytes Absolute 0.1 - 1.0  K/uL 1.1 High    ?Eosinophils Relative % 2   ?Eosinophils Absolute 0.0 - 0.5 K/uL 0.2   ?Basophils Relative % 1   ?Basophils Absolute 0.0 - 0.1 K/uL 0.1   ?Immature Granulocytes % 1   ?Abs Immature Granulocytes 0

## 2022-02-24 ENCOUNTER — Ambulatory Visit: Payer: Medicare Other | Admitting: Registered Nurse

## 2022-02-24 ENCOUNTER — Ambulatory Visit
Admission: RE | Admit: 2022-02-24 | Discharge: 2022-02-24 | Disposition: A | Discharge status: Home / Self Care | Payer: Medicare Other | Attending: General Surgery | Admitting: General Surgery

## 2022-02-24 ENCOUNTER — Ambulatory Visit: Payer: Medicare Other

## 2022-02-24 ENCOUNTER — Other Ambulatory Visit: Payer: Self-pay

## 2022-02-24 ENCOUNTER — Encounter: Payer: Self-pay | Admitting: General Surgery

## 2022-02-24 ENCOUNTER — Encounter
Admission: RE | Disposition: A | Discharge status: Home / Self Care | Payer: Self-pay | Source: Home / Self Care | Attending: General Surgery

## 2022-02-24 DIAGNOSIS — K219 Gastro-esophageal reflux disease without esophagitis: Secondary | ICD-10-CM | POA: Diagnosis not present

## 2022-02-24 DIAGNOSIS — Z7951 Long term (current) use of inhaled steroids: Secondary | ICD-10-CM | POA: Diagnosis not present

## 2022-02-24 DIAGNOSIS — Z87891 Personal history of nicotine dependence: Secondary | ICD-10-CM | POA: Insufficient documentation

## 2022-02-24 DIAGNOSIS — F419 Anxiety disorder, unspecified: Secondary | ICD-10-CM | POA: Diagnosis not present

## 2022-02-24 DIAGNOSIS — C221 Intrahepatic bile duct carcinoma: Secondary | ICD-10-CM | POA: Insufficient documentation

## 2022-02-24 DIAGNOSIS — I2510 Atherosclerotic heart disease of native coronary artery without angina pectoris: Secondary | ICD-10-CM | POA: Diagnosis not present

## 2022-02-24 DIAGNOSIS — G4730 Sleep apnea, unspecified: Secondary | ICD-10-CM | POA: Diagnosis not present

## 2022-02-24 DIAGNOSIS — I10 Essential (primary) hypertension: Secondary | ICD-10-CM | POA: Insufficient documentation

## 2022-02-24 DIAGNOSIS — Z6836 Body mass index (BMI) 36.0-36.9, adult: Secondary | ICD-10-CM | POA: Diagnosis not present

## 2022-02-24 DIAGNOSIS — J449 Chronic obstructive pulmonary disease, unspecified: Secondary | ICD-10-CM | POA: Insufficient documentation

## 2022-02-24 DIAGNOSIS — Z79899 Other long term (current) drug therapy: Secondary | ICD-10-CM | POA: Insufficient documentation

## 2022-02-24 DIAGNOSIS — F32A Depression, unspecified: Secondary | ICD-10-CM | POA: Insufficient documentation

## 2022-02-24 DIAGNOSIS — Z452 Encounter for adjustment and management of vascular access device: Secondary | ICD-10-CM | POA: Insufficient documentation

## 2022-02-24 HISTORY — PX: PORTACATH PLACEMENT: SHX2246

## 2022-02-24 LAB — POCT I-STAT, CHEM 8
BUN: 11 mg/dL (ref 8–23)
Calcium, Ion: 1.23 mmol/L (ref 1.15–1.40)
Chloride: 103 mmol/L (ref 98–111)
Creatinine, Ser: 0.7 mg/dL (ref 0.44–1.00)
Glucose, Bld: 120 mg/dL — ABNORMAL HIGH (ref 70–99)
HCT: 38 % (ref 36.0–46.0)
Hemoglobin: 12.9 g/dL (ref 12.0–15.0)
Potassium: 4.2 mmol/L (ref 3.5–5.1)
Sodium: 140 mmol/L (ref 135–145)
TCO2: 28 mmol/L (ref 22–32)

## 2022-02-24 SURGERY — INSERTION, TUNNELED CENTRAL VENOUS DEVICE, WITH PORT
Anesthesia: Monitor Anesthesia Care | Site: Chest | Laterality: Left

## 2022-02-24 MED ORDER — FENTANYL CITRATE (PF) 100 MCG/2ML IJ SOLN: 25.0000 ug | INTRAMUSCULAR | Status: DC | PRN

## 2022-02-24 MED ORDER — ORAL CARE MOUTH RINSE: 15.0000 mL | Freq: Once | OROMUCOSAL | Status: AC

## 2022-02-24 MED ORDER — MIDAZOLAM HCL 2 MG/2ML IJ SOLN
INTRAMUSCULAR | Status: DC | PRN
  Administered 2022-02-24: 1 mg via INTRAVENOUS

## 2022-02-24 MED ORDER — ACETAMINOPHEN 10 MG/ML IV SOLN
INTRAVENOUS | Status: AC
  Filled 2022-02-24: qty 100

## 2022-02-24 MED ORDER — CHLORHEXIDINE GLUCONATE CLOTH 2 % EX PADS
6.0000 | MEDICATED_PAD | Freq: Once | CUTANEOUS | Status: AC
  Administered 2022-02-24: 6 via TOPICAL

## 2022-02-24 MED ORDER — CEFAZOLIN SODIUM-DEXTROSE 2-4 GM/100ML-% IV SOLN
INTRAVENOUS | Status: AC
  Filled 2022-02-24: qty 100

## 2022-02-24 MED ORDER — SODIUM CHLORIDE (PF) 0.9 % IJ SOLN
INTRAMUSCULAR | Status: DC | PRN
  Administered 2022-02-24: 50 mL

## 2022-02-24 MED ORDER — ONDANSETRON HCL 4 MG/2ML IJ SOLN: 4.0000 mg | Freq: Once | INTRAMUSCULAR | Status: DC | PRN

## 2022-02-24 MED ORDER — LIDOCAINE HCL (PF) 1 % IJ SOLN
INTRAMUSCULAR | Status: AC
  Filled 2022-02-24: qty 30

## 2022-02-24 MED ORDER — ACETAMINOPHEN 10 MG/ML IV SOLN
INTRAVENOUS | Status: DC | PRN
  Administered 2022-02-24: 1000 mg via INTRAVENOUS

## 2022-02-24 MED ORDER — SODIUM CHLORIDE (PF) 0.9 % IJ SOLN
INTRAMUSCULAR | Status: AC
  Filled 2022-02-24: qty 50

## 2022-02-24 MED ORDER — PROPOFOL 1000 MG/100ML IV EMUL
INTRAVENOUS | Status: AC
  Filled 2022-02-24: qty 100

## 2022-02-24 MED ORDER — FAMOTIDINE 20 MG PO TABS
20.0000 mg | ORAL_TABLET | Freq: Once | ORAL | Status: DC
Start: 2022-02-24 — End: 2022-02-24

## 2022-02-24 MED ORDER — MIDAZOLAM HCL 2 MG/2ML IJ SOLN
INTRAMUSCULAR | Status: AC
Start: 2022-02-24 — End: ?
  Filled 2022-02-24: qty 2

## 2022-02-24 MED ORDER — LACTATED RINGERS IV SOLN: INTRAVENOUS | Status: DC

## 2022-02-24 MED ORDER — PROPOFOL 500 MG/50ML IV EMUL
INTRAVENOUS | Status: DC | PRN
  Administered 2022-02-24: 150 ug/kg/min via INTRAVENOUS

## 2022-02-24 MED ORDER — FENTANYL CITRATE (PF) 100 MCG/2ML IJ SOLN
INTRAMUSCULAR | Status: AC
  Filled 2022-02-24: qty 2

## 2022-02-24 MED ORDER — FENTANYL CITRATE (PF) 100 MCG/2ML IJ SOLN
INTRAMUSCULAR | Status: DC | PRN
  Administered 2022-02-24: 25 ug via INTRAVENOUS

## 2022-02-24 MED ORDER — CHLORHEXIDINE GLUCONATE CLOTH 2 % EX PADS
6.0000 | MEDICATED_PAD | Freq: Once | CUTANEOUS | Status: AC
  Administered 2022-02-23: 6 via TOPICAL

## 2022-02-24 MED ORDER — CHLORHEXIDINE GLUCONATE 0.12 % MT SOLN
15.0000 mL | Freq: Once | OROMUCOSAL | Status: AC
  Administered 2022-02-24: 15 mL via OROMUCOSAL

## 2022-02-24 MED ORDER — CHLORHEXIDINE GLUCONATE 0.12 % MT SOLN
OROMUCOSAL | Status: AC
  Filled 2022-02-24: qty 15

## 2022-02-24 MED ORDER — CEFAZOLIN SODIUM-DEXTROSE 2-4 GM/100ML-% IV SOLN
2.0000 g | INTRAVENOUS | Status: AC
  Administered 2022-02-24: 2 g via INTRAVENOUS

## 2022-02-24 MED ORDER — LIDOCAINE HCL (PF) 1 % IJ SOLN
INTRAMUSCULAR | Status: DC | PRN
  Administered 2022-02-24: 10 mL via INTRADERMAL

## 2022-02-24 MED ORDER — EPHEDRINE SULFATE (PRESSORS) 50 MG/ML IJ SOLN
INTRAMUSCULAR | Status: DC | PRN
Start: 2022-02-24 — End: 2022-02-24
  Administered 2022-02-24: 10 mg via INTRAVENOUS

## 2022-02-24 SURGICAL SUPPLY — 35 items
APL PRP STRL LF DISP 70% ISPRP (MISCELLANEOUS) ×1
BAG DECANTER FOR FLEXI CONT (MISCELLANEOUS) ×2 IMPLANT
BLADE SURG 15 STRL SS SAFETY (BLADE) ×2 IMPLANT
CHLORAPREP W/TINT 26 (MISCELLANEOUS) ×2 IMPLANT
COVER LIGHT HANDLE STERIS (MISCELLANEOUS) ×4 IMPLANT
DRAPE C-ARM XRAY 36X54 (DRAPES) ×2 IMPLANT
DRAPE LAPAROTOMY TRNSV 106X77 (MISCELLANEOUS) ×2 IMPLANT
DRSG TEGADERM 2-3/8X2-3/4 SM (GAUZE/BANDAGES/DRESSINGS) ×2 IMPLANT
DRSG TEGADERM 4X4.75 (GAUZE/BANDAGES/DRESSINGS) ×2 IMPLANT
DRSG TELFA 4X3 1S NADH ST (GAUZE/BANDAGES/DRESSINGS) ×2 IMPLANT
ELECT REM PT RETURN 9FT ADLT (ELECTROSURGICAL) ×2
ELECTRODE REM PT RTRN 9FT ADLT (ELECTROSURGICAL) ×1 IMPLANT
GAUZE 4X4 16PLY ~~LOC~~+RFID DBL (SPONGE) ×2 IMPLANT
GLOVE BIO SURGEON STRL SZ7.5 (GLOVE) ×2 IMPLANT
GLOVE BIOGEL PI IND STRL 8 (GLOVE) ×1 IMPLANT
GLOVE BIOGEL PI INDICATOR 8 (GLOVE) ×1
GOWN STRL REUS W/ TWL LRG LVL3 (GOWN DISPOSABLE) ×2 IMPLANT
GOWN STRL REUS W/TWL LRG LVL3 (GOWN DISPOSABLE) ×4
IV NS 500ML (IV SOLUTION) ×2
IV NS 500ML BAXH (IV SOLUTION) ×1 IMPLANT
KIT PORT POWER 8FR ISP CVUE (Port) ×2 IMPLANT
KIT TURNOVER KIT A (KITS) ×2 IMPLANT
LABEL OR SOLS (LABEL) ×2 IMPLANT
MANIFOLD NEPTUNE II (INSTRUMENTS) ×2 IMPLANT
PACK PORT-A-CATH (MISCELLANEOUS) ×2 IMPLANT
SPIKE FLUID TRANSFER (MISCELLANEOUS) ×4 IMPLANT
STRIP CLOSURE SKIN 1/2X4 (GAUZE/BANDAGES/DRESSINGS) ×2 IMPLANT
SUT PROLENE 3 0 SH DA (SUTURE) ×2 IMPLANT
SUT VIC AB 3-0 SH 27 (SUTURE) ×2
SUT VIC AB 3-0 SH 27X BRD (SUTURE) ×1 IMPLANT
SUT VIC AB 4-0 FS2 27 (SUTURE) ×2 IMPLANT
SWABSTK COMLB BENZOIN TINCTURE (MISCELLANEOUS) ×2 IMPLANT
SYR 10ML LL (SYRINGE) ×2 IMPLANT
SYR 10ML SLIP (SYRINGE) ×2 IMPLANT
WATER STERILE IRR 500ML POUR (IV SOLUTION) ×2 IMPLANT

## 2022-02-24 NOTE — H&P (Signed)
Marcia Livingston ?[id number] ?[birth year] ? ?  ? ?HPI:  76 y/o woman with recently identified with cholangiocarcinoma.  For chemotherapy. Central venous access requested.  ? ?Medications Prior to Admission  ?Medication Sig Dispense Refill Last Dose  ? aspirin EC 81 MG tablet Take 81 mg by mouth daily.   Past Month  ? buPROPion (WELLBUTRIN XL) 150 MG 24 hr tablet TAKE 1 TABLET BY MOUTH  DAILY 90 tablet 1 02/24/2022  ? busPIRone (BUSPAR) 15 MG tablet Take 15 mg by mouth 3 (three) times daily.   02/24/2022  ? calcium carbonate (TUMS - DOSED IN MG ELEMENTAL CALCIUM) 500 MG chewable tablet Chew 1 tablet by mouth 3 (three) times daily with meals.   Past Month  ? cholecalciferol (VITAMIN D) 1000 units tablet Take 1,000 Units by mouth daily.   02/23/2022  ? colestipol (COLESTID) 1 g tablet TAKE 1 TABLET BY MOUTH  DAILY 90 tablet 0 Past Month  ? DULoxetine (CYMBALTA) 60 MG capsule TAKE 1 CAPSULE BY MOUTH  DAILY 90 capsule 1 02/24/2022  ? Fluticasone-Umeclidin-Vilant (TRELEGY ELLIPTA) 100-62.5-25 MCG/INH AEPB Inhale 1 puff into the lungs daily.    Past Month  ? gabapentin (NEURONTIN) 300 MG capsule Take 300 mg by mouth 3 (three) times daily.   02/23/2022  ? hydrochlorothiazide (HYDRODIURIL) 25 MG tablet TAKE 1 TABLET BY MOUTH  DAILY 90 tablet 3 02/23/2022  ? losartan (COZAAR) 50 MG tablet TAKE 1 TABLET BY MOUTH  DAILY 90 tablet 3 02/24/2022  ? ondansetron (ZOFRAN) 4 MG tablet Take 1 tablet (4 mg total) by mouth every 6 (six) hours as needed for nausea. 20 tablet 0 Past Month  ? ondansetron (ZOFRAN) 8 MG tablet One pill every 8 hours as needed for nausea/vomitting. 40 tablet 1 Past Month  ? potassium chloride SA (KLOR-CON M) 20 MEQ tablet 1 pill twice a day 60 tablet 1 02/24/2022  ? rosuvastatin (CRESTOR) 5 MG tablet Take 5 mg by mouth 3 (three) times a week.   Past Month  ? traZODone (DESYREL) 50 MG tablet Take 1-2 tablets at bedtime for sleep. 180 tablet 1 02/23/2022  ? vitamin B-12 (CYANOCOBALAMIN) 500 MCG tablet Take 500 mcg by mouth daily.    02/23/2022  ? ALPRAZolam (XANAX) 0.5 MG tablet Take 1 tablet (0.5 mg total) by mouth 2 (two) times daily as needed for anxiety. 15 tablet 0 02/21/2022  ? lidocaine-prilocaine (EMLA) cream Apply on the port. 30 -45 min  prior to port access. 30 g 3   ? prochlorperazine (COMPAZINE) 10 MG tablet Take 1 tablet (10 mg total) by mouth every 6 (six) hours as needed for nausea or vomiting. 40 tablet 1   ? ?Allergies  ?Allergen Reactions  ? Penicillins Rash  ?  Has patient had a PCN reaction causing immediate rash, facial/tongue/throat swelling, SOB or lightheadedness with hypotension: yes  ? Hydromorphone Other (See Comments)  ?  Hallucinations  ? Meperidine Hcl Other (See Comments)  ?  Hallucinations  ? Codeine Hives  ? Sulfa Antibiotics Hives  ? ?Past Medical History:  ?Diagnosis Date  ? Anxiety   ? Arthritis   ? CAD (coronary artery disease)   ? Cancer [facility])   ? SKIN  ? Colon polyps   ? COPD (chronic obstructive pulmonary disease) ([place])   ? Depression   ? Depression   ? Fibrocystic breast disease   ? GERD (gastroesophageal reflux disease)   ? Heart murmur   ? High cholesterol   ? Hyperlipidemia   ? Hypertension   ?  IBS (irritable bowel syndrome)   ? IBS (irritable bowel syndrome)   ? Neuromuscular disorder ([place])   ? Obesity   ? Osteoporosis   ? Overactive bladder   ? Sleep apnea   ? Sleep apnea   ? ?Past Surgical History:  ?Procedure Laterality Date  ? ABDOMINAL HYSTERECTOMY    ? total  ? APPENDECTOMY    ? BREAST EXCISIONAL BIOPSY Right YRS AGO  ? NEG  ? CATARACT EXTRACTION W/PHACO Right 02/01/2016  ? Procedure: CATARACT EXTRACTION PHACO AND INTRAOCULAR LENS PLACEMENT (IOC);  Surgeon: Birder Robson, MD;  Location: [facility] ORS;  Service: Ophthalmology;  Laterality: Right;  [place]    00:37.2 ?AP%  20.7% ?CDE  7.69 ?fluid pack lot # [id number]  ? CATARACT EXTRACTION W/PHACO Left 02/22/2016  ? Procedure: CATARACT EXTRACTION PHACO AND INTRAOCULAR LENS PLACEMENT (IOC);  Surgeon: Birder Robson, MD;  Location: [facility] ORS;  Service:  Ophthalmology;  Laterality: Left;  [place] 00:40 ?AP% 19.9 ?CDE 8.06 ?fluid pack lot # [id number]  ? CHOLECYSTECTOMY    ? COLONOSCOPY WITH PROPOFOL N/A 01/13/2016  ? Procedure: COLONOSCOPY WITH PROPOFOL;  Surgeon: Manya Silvas, MD;  Location: [facility] ENDOSCOPY;  Service: Endoscopy;  Laterality: N/A;  ? COLONOSCOPY WITH PROPOFOL N/A 12/14/2021  ? Procedure: COLONOSCOPY WITH PROPOFOL;  Surgeon: Toledo, Benay Pike, MD;  Location: [facility] ENDOSCOPY;  Service: Gastroenterology;  Laterality: N/A;  ? ERCP N/A 01/31/2022  ? Procedure: ENDOSCOPIC RETROGRADE CHOLANGIOPANCREATOGRAPHY (ERCP);  Surgeon: Lucilla Lame, MD;  Location: [facility] ENDOSCOPY;  Service: Endoscopy;  Laterality: N/A;  ? EUS N/A 02/16/2022  ? Procedure: UPPER ENDOSCOPIC ULTRASOUND (EUS) LINEAR;  Surgeon: Holly Bodily, MD;  Location: [facility] ENDOSCOPY;  Service: Gastroenterology;  Laterality: N/A;  [facility]  ? EYE SURGERY    ? FOOT SURGERY Left   ? for breakdown of arch  ? OVARIAN CYST SURGERY    ? 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 Right [place]  ? ?Social History  ? ?Socioeconomic History  ? Marital status: Married  ?  Spouse name: Orpah Cobb. [place]  ? Number of children: 2  ? Years of education: 69  ? Highest education level: High school graduate  ?Occupational History  ? 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 Smoking status: Former  ?  Packs/day: 1.00  ?  Years: 30.00  ?  Pack years: 30.00  ?  Types: Cigarettes  ?  Quit date: 10/15/1996  ?  Years since quitting: 25.3  ? Smokeless tobacco: Never  ?Vaping Use  ? Vaping Use: Never used  ?Substance and Sexual Activity  ? Alcohol use: Yes  ?  Comment: wine rarely  ? Drug use: No  ? Sexual activity: Not Currently  ?  Partners: Male  ?  Birth control/protection: Surgical  ?Other Topics Concern  ? Not on file  ?Social History Narrative  ? Pt states she has raised her granddaughter, and considers the granddaughter her child.  ? ?Social Determinants of Health  ? ?Financial Resource Strain: Not on file   ?Food Insecurity: Not on file  ?Transportation Needs: Not on file  ?Physical Activity: Not on file  ?Stress: Not on file  ?Social Connections: Not on file  ?Intimate Partner Violence: Not on file  ? ?Social History  ? ?Social History Narrative  ? Pt states she has raised her granddaughter, and considers the granddaughter her child.  ? ? ? ?ROS: Negative.  ? ? ? ?PE: ?HEENT: Negative. ?Lungs: Clear. ?Cardio: RR. ? ? ?Assessment/Plan: ? ?  Proceed with planned power port placement.  ? ?Robert Bellow ?02/24/2022 ? ?  ?

## 2022-02-24 NOTE — Transfer of Care (Signed)
Immediate Anesthesia Transfer of Care Note ? ?Patient: Marcia Livingston ? ?Procedure(s) Performed: INSERTION PORT-A-CATH (Left: Chest) ? ?Patient Location: PACU ? ?Anesthesia Type:General ? ?Level of Consciousness: awake, alert  and oriented ? ?Airway & Oxygen Therapy: Patient Spontanous Breathing and Patient connected to face mask oxygen ? ?Post-op Assessment: Report given to RN and Post -op Vital signs reviewed and stable ? ?Post vital signs: Reviewed and stable ? ?Last Vitals:  ?Vitals Value Taken Time  ?BP 123/58 02/24/22 1103  ?Temp    ?Pulse 77 02/24/22 1103  ?Resp 23 02/24/22 1103  ?SpO2 98 % 02/24/22 1103  ? ? ?Last Pain:  ?Vitals:  ? 02/24/22 0834  ?TempSrc: Temporal  ?PainSc: 0-No pain  ?   ? ?Patients Stated Pain Goal: 0 (02/24/22 0834) ? ?Complications: No notable events documented. ?

## 2022-02-24 NOTE — Op Note (Signed)
Preoperative diagnosis: Cholangiocarcinoma. ? ?Postoperative diagnosis: Same. ? ?Operative procedure: Left subclavian PowerPort placement with ultrasound and fluoroscopic guidance. ? ?Operating surgeon: Hervey Ard, MD. ? ?Anesthesia, monitored anesthesia care, lidocaine 1% plain, 10 cc. ? ?Estimated blood loss: Less than 5 cc. ? ?Clinical note: This 76 year old woman is recently been identified with cholangiocarcinoma and is considered a candidate for chemotherapy.  Central venous access was requested. ? ?Operative note: With the patient under adequate sedation the left chest and neck was cleansed with ChloraPrep and draped.  Ultrasound was used to confirm patency of the left subclavian venous system.  Under ultrasound guidance the vein was cannulated in a single stick followed by advancement of the guidewire and dilator under fluoroscopic control.  The catheter was then positioned near the junction of the SVC and right atrium.  The catheter was tunneled to a pocket on the left anterior chest.  The port was anchored to the deep tissue with 3-0 Prolene sutures x2.  The adipose layer was closed with a running 3-0 Vicryl and the skin closed with a running 4-0 Vicryl subcuticular suture.  The port was accessed and easily irrigated and aspirated.  Benzoin, Steri-Strips, Telfa and Tegaderm dressing applied. ? ?The patient was taken to the PACU in stable condition. ? ?Rec portable chest x-ray in the PACU showed catheter tip as described above and no evidence of pneumothorax. ?

## 2022-02-24 NOTE — Anesthesia Preprocedure Evaluation (Addendum)
Anesthesia Evaluation  ?Patient identified by MRN, date of birth, ID band ?Patient awake ? ? ? ?Airway ?Mallampati: II ? ?TM Distance: >3 FB ? ? ? ? Dental ? ?(+) Teeth Intact ?  ?Pulmonary ?sleep apnea and Continuous Positive Airway Pressure Ventilation , COPD, former smoker,  ?  ? ?+ decreased breath sounds ? ? ? ? ? Cardiovascular ?Exercise Tolerance: Poor ?hypertension, Pt. on medications ?+ angina + CAD  ? ?Rhythm:Regular Rate:Normal ? ? ?  ?Neuro/Psych ?Anxiety Depression   ? GI/Hepatic ?Neg liver ROS, GERD  ,  ?Endo/Other  ?Morbid obesity ? Renal/GU ?negative Renal ROS  ? ?  ?Musculoskeletal ? ?(+) Arthritis ,  ? Abdominal ?(+) + obese,   ?Peds ?negative pediatric ROS ?(+)  Hematology ?negative hematology ROS ?(+)   ?Anesthesia Other Findings ? ? Reproductive/Obstetrics ? ?  ? ? ? ? ? ? ? ? ? ? ? ? ? ?  ?  ? ? ? ? ? ? ? ?Anesthesia Physical ?Anesthesia Plan ? ?ASA: 3 ? ?Anesthesia Plan: MAC  ? ?Post-op Pain Management:   ? ?Induction: Intravenous ? ?PONV Risk Score and Plan:  ? ?Airway Management Planned: Natural Airway ? ?Additional Equipment:  ? ?Intra-op Plan:  ? ?Post-operative Plan:  ? ?Informed Consent: I have reviewed the patients History and Physical, chart, labs and discussed the procedure including the risks, benefits and alternatives for the proposed anesthesia with the patient or authorized representative who has indicated his/her understanding and acceptance.  ? ? ? ? ? ?Plan Discussed with: CRNA and Surgeon ? ?Anesthesia Plan Comments:   ? ? ? ? ? ? ?Anesthesia Quick Evaluation ? ?

## 2022-02-24 NOTE — Discharge Instructions (Signed)

## 2022-02-24 NOTE — Anesthesia Postprocedure Evaluation (Signed)
Anesthesia Post Note ? ?Patient: Marcia Livingston ? ?Procedure(s) Performed: INSERTION PORT-A-CATH (Left: Chest) ? ?Patient location during evaluation: PACU ?Anesthesia Type: MAC ?Level of consciousness: awake ?Pain management: pain level controlled ?Vital Signs Assessment: post-procedure vital signs reviewed and stable ?Respiratory status: spontaneous breathing ?Cardiovascular status: stable ?Anesthetic complications: no ? ? ?No notable events documented. ? ? ?Last Vitals:  ?Vitals:  ? 02/24/22 1115 02/24/22 1135  ?BP: (!) 121/44   ?Pulse: 74 70  ?Resp: 14 (!) 22  ?Temp:  (!) 36.2 ?C  ?SpO2: 100% 95%  ?  ?Last Pain:  ?Vitals:  ? 02/24/22 1135  ?TempSrc:   ?PainSc: 0-No pain  ? ? ?  ?  ?  ?  ?  ?  ? ?VAN STAVEREN,Marcia Livingston ? ? ? ? ?

## 2022-02-27 ENCOUNTER — Inpatient Hospital Stay: Payer: Medicare Other

## 2022-02-27 ENCOUNTER — Encounter: Payer: Self-pay | Admitting: Internal Medicine

## 2022-02-27 MED FILL — Fosaprepitant Dimeglumine For IV Infusion 150 MG (Base Eq): INTRAVENOUS | Qty: 5 | Status: AC

## 2022-02-27 MED FILL — Dexamethasone Sodium Phosphate Inj 100 MG/10ML: INTRAMUSCULAR | Qty: 1 | Status: AC

## 2022-02-28 ENCOUNTER — Inpatient Hospital Stay (HOSPITAL_BASED_OUTPATIENT_CLINIC_OR_DEPARTMENT_OTHER): Payer: Medicare Other | Admitting: Internal Medicine

## 2022-02-28 ENCOUNTER — Inpatient Hospital Stay: Payer: Medicare Other

## 2022-02-28 ENCOUNTER — Telehealth: Payer: Self-pay

## 2022-02-28 ENCOUNTER — Other Ambulatory Visit: Payer: Self-pay

## 2022-02-28 ENCOUNTER — Encounter: Payer: Self-pay | Admitting: Internal Medicine

## 2022-02-28 DIAGNOSIS — C221 Intrahepatic bile duct carcinoma: Secondary | ICD-10-CM

## 2022-02-28 DIAGNOSIS — Z5112 Encounter for antineoplastic immunotherapy: Secondary | ICD-10-CM | POA: Diagnosis not present

## 2022-02-28 DIAGNOSIS — R5383 Other fatigue: Secondary | ICD-10-CM

## 2022-02-28 LAB — CBC WITH DIFFERENTIAL/PLATELET
Abs Immature Granulocytes: 0.08 10*3/uL — ABNORMAL HIGH (ref 0.00–0.07)
Basophils Absolute: 0.1 10*3/uL (ref 0.0–0.1)
Basophils Relative: 1 %
Eosinophils Absolute: 0.3 10*3/uL (ref 0.0–0.5)
Eosinophils Relative: 4 %
HCT: 37.7 % (ref 36.0–46.0)
Hemoglobin: 12.4 g/dL (ref 12.0–15.0)
Immature Granulocytes: 1 %
Lymphocytes Relative: 22 %
Lymphs Abs: 1.9 10*3/uL (ref 0.7–4.0)
MCH: 29.7 pg (ref 26.0–34.0)
MCHC: 32.9 g/dL (ref 30.0–36.0)
MCV: 90.2 fL (ref 80.0–100.0)
Monocytes Absolute: 0.8 10*3/uL (ref 0.1–1.0)
Monocytes Relative: 9 %
Neutro Abs: 5.5 10*3/uL (ref 1.7–7.7)
Neutrophils Relative %: 63 %
Platelets: 306 10*3/uL (ref 150–400)
RBC: 4.18 MIL/uL (ref 3.87–5.11)
RDW: 12.4 % (ref 11.5–15.5)
WBC: 8.6 10*3/uL (ref 4.0–10.5)
nRBC: 0 % (ref 0.0–0.2)

## 2022-02-28 LAB — COMPREHENSIVE METABOLIC PANEL
ALT: 14 U/L (ref 0–44)
AST: 19 U/L (ref 15–41)
Albumin: 3.4 g/dL — ABNORMAL LOW (ref 3.5–5.0)
Alkaline Phosphatase: 194 U/L — ABNORMAL HIGH (ref 38–126)
Anion gap: 10 (ref 5–15)
BUN: 10 mg/dL (ref 8–23)
CO2: 27 mmol/L (ref 22–32)
Calcium: 9 mg/dL (ref 8.9–10.3)
Chloride: 98 mmol/L (ref 98–111)
Creatinine, Ser: 0.81 mg/dL (ref 0.44–1.00)
GFR, Estimated: >60 mL/min (ref >60)
Glucose, Bld: 135 mg/dL — ABNORMAL HIGH (ref 70–99)
Potassium: 3.6 mmol/L (ref 3.5–5.1)
Sodium: 135 mmol/L (ref 135–145)
Total Bilirubin: 0.7 mg/dL (ref 0.3–1.2)
Total Protein: 8.1 g/dL (ref 6.5–8.1)

## 2022-02-28 LAB — CYTOLOGY - NON PAP

## 2022-02-28 MED ORDER — SODIUM CHLORIDE 0.9 % IV SOLN
30.0000 mg/m2 | Freq: Once | INTRAVENOUS | Status: AC
  Administered 2022-02-28: 59 mg via INTRAVENOUS
  Filled 2022-02-28: qty 59

## 2022-02-28 MED ORDER — MAGNESIUM SULFATE 2 GM/50ML IV SOLN
2.0000 g | Freq: Once | INTRAVENOUS | Status: AC
  Administered 2022-02-28: 2 g via INTRAVENOUS
  Filled 2022-02-28: qty 50

## 2022-02-28 MED ORDER — SODIUM CHLORIDE 0.9 % IV SOLN
1500.0000 mg | Freq: Once | INTRAVENOUS | Status: AC
  Administered 2022-02-28: 1500 mg via INTRAVENOUS
  Filled 2022-02-28: qty 30

## 2022-02-28 MED ORDER — PALONOSETRON HCL INJECTION 0.25 MG/5ML
0.2500 mg | Freq: Once | INTRAVENOUS | Status: AC
  Administered 2022-02-28: 0.25 mg via INTRAVENOUS
  Filled 2022-02-28: qty 5

## 2022-02-28 MED ORDER — SODIUM CHLORIDE 0.9 % IV SOLN
10.0000 mg | Freq: Once | INTRAVENOUS | Status: AC
  Administered 2022-02-28: 10 mg via INTRAVENOUS
  Filled 2022-02-28: qty 10

## 2022-02-28 MED ORDER — SODIUM CHLORIDE 0.9 % IV SOLN
150.0000 mg | Freq: Once | INTRAVENOUS | Status: AC
  Administered 2022-02-28: 150 mg via INTRAVENOUS
  Filled 2022-02-28: qty 150

## 2022-02-28 MED ORDER — SODIUM CHLORIDE 0.9 % IV SOLN
1600.0000 mg | Freq: Once | INTRAVENOUS | Status: AC
  Administered 2022-02-28: 1600 mg via INTRAVENOUS
  Filled 2022-02-28: qty 26.3

## 2022-02-28 MED ORDER — SODIUM CHLORIDE 0.9 % IV SOLN
Freq: Once | INTRAVENOUS | Status: AC
  Filled 2022-02-28: qty 250

## 2022-02-28 MED ORDER — HEPARIN SOD (PORK) LOCK FLUSH 100 UNIT/ML IV SOLN
500.0000 [IU] | Freq: Once | INTRAVENOUS | Status: AC | PRN
  Administered 2022-02-28: 500 [IU]
  Filled 2022-02-28: qty 5

## 2022-02-28 MED ORDER — POTASSIUM CHLORIDE IN NACL 20-0.9 MEQ/L-% IV SOLN
Freq: Once | INTRAVENOUS | Status: AC
  Filled 2022-02-28: qty 1000

## 2022-02-28 NOTE — Progress Notes (Signed)
[facility] ?OFFICE PROGRESS NOTE ? ?Patient Care Team: ?Baxter Hire, MD as PCP - General (Internal Medicine) ?Birder Robson, MD as Referring Physician (Ophthalmology) ?Isaias Cowman, MD as Consulting Physician (Cardiology) ?Ottie Glazier, MD as Consulting Physician (Pulmonary Disease) ? ? 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 ? ? ?Oncology History Overview Note  ?IMPRESSION: ?Technically limited examination due to extensive respiratory motion ?artifact. Despite this, enhancing, infiltrative mass centered within ?the central left hepatic lobe amputate the biliary tree of the ?segments 2 and 3 and abuts the confluence of the hepatic ducts and ?is most in keeping with an infiltrative cholangiocarcinoma. This ?measures approximately 3.5 x 4.5 cm in greatest dimension. Extensive ?associated pathologic hilar and periportal lymphadenopathy results ?in subsequent obstruction of the mid common duct secondary to ?extrinsic compression. ?  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 ?  ?Pathologic remote retroperitoneal adenopathy within the aortocaval ?and left periaortic lymph node groups. ?  ?Correlation with endoscopic ultrasound would be helpful for further ?evaluation ?  ?  ?Electronically Signed ?  By: Fidela Salisbury M.D. ?  On: 01/28/2022 03:29 ? ?IMPRESSION: ?1. Hypermetabolic left hepatic lobe cholangiocarcinoma with ?metastatic adenopathy in the chest and abdomen. ?2. Hypermetabolic right adrenal nodule worrisome for a metastasis. ?3. Small pericardial effusion, minimally increased from 01/29/2022. ?4. Left adrenal adenoma.  No follow-up necessary. ?5. Punctate right renal stones. ?6. Aortic  atherosclerosis (ICD10-I70.0). Coronary artery ?calcification. ?  ?  ?Electronically Signed ?  By: Lorin Picket M.D. ?  On: 02/15/2022 09:13 ? ?DIAGNOSIS:  ?A. PERIPANCREATIC MASS; EUS-ASSISTED FNA:  ?- POSITIVE FOR MALIGNANCY.  ?- ADENOCARCINOMA IS PRESENT.  ? ?There is insufficient material for ancillary molecular testing.  ? ?The specimen is adequate for interpretation.  ? ?# MAY 16th, 2023- Gem-Cis- Durvalumab.  ?  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 ?  ?02/21/2022 Cancer Staging  ? Staging form: Intrahepatic Bile Duct, AJCC 8th Edition ?- Clinical: Stage IV (cT1a, cN1, cM1) - Signed by Cammie Sickle, MD on 02/21/2022 ?Stage prefix: Initial diagnosis ? ?  ?02/28/2022 -  Chemotherapy  ? Patient is on Treatment Plan : BLADDER Gemcitabine D1,8 + Cisplatin (split dose) D1,8 q21d x 4 cycles  ? ?   ? ?  ? ?INTERVAL HISTORY: Ambulating independently.  Accompanied by her daughter. ? ?Marcia Livingston 76 y.o.  female pleasant stage IV intrahepatic cholangiocarcinoma is here to proceed with chemotherapy. ? ?In the interim patient port placed.  ? ?Otherwise denies any significant nausea vomiting.  No abdominal pain.  Chronic mild back pain not any worse.  Appetite is fair.  No fever no chills. ? ?Patient complains of poor appetite.  Patient is contemplating moved to [place] to be with the family middle of June. ? ?Review of Systems  ?Constitutional:  Positive for malaise/fatigue and weight loss. Negative for chills, diaphoresis and fever.  ?HENT:  Negative for nosebleeds and sore throat.   ?Eyes:  Negative for double vision.  ?Respiratory:  Negative for cough, hemoptysis, sputum production, shortness of breath and wheezing.   ?Cardiovascular:  Negative for chest pain, palpitations, orthopnea and leg swelling.  ?Gastrointestinal:  Negative for abdominal pain, blood in stool, constipation, diarrhea,  heartburn, melena, nausea and vomiting.  ?Genitourinary:   Negative for dysuria, frequency and urgency.  ?Musculoskeletal:  Positive for back pain and joint pain.  ?Skin: Negative.  Negative for itching and rash.  ?Neurological:  Negative for dizziness, tingling, focal weakness, weakness and headaches.  ?Endo/Heme/Allergies:  Does not bruise/bleed easily.  ?Psychiatric/Behavioral:  Negative for depression. The patient is not nervous/anxious and does not have insomnia.   ?  ? ?PAST MEDICAL HISTORY :  ?Past Medical History:  ?Diagnosis Date  ?? Anxiety   ?? Arthritis   ?? CAD (coronary artery disease)   ?? Cancer [facility])   ? SKIN  ?? Colon polyps   ?? COPD (chronic obstructive pulmonary disease) ([place])   ?? Depression   ?? Depression   ?? Fibrocystic breast disease   ?? GERD (gastroesophageal reflux disease)   ?? Heart murmur   ?? High cholesterol   ?? Hyperlipidemia   ?? Hypertension   ?? IBS (irritable bowel syndrome)   ?? IBS (irritable bowel syndrome)   ?? Neuromuscular disorder ([place])   ?? Obesity   ?? Osteoporosis   ?? Overactive bladder   ?? Sleep apnea   ?? Sleep apnea   ? ? ?PAST SURGICAL HISTORY :   ?Past Surgical History:  ?Procedure Laterality Date  ?? ABDOMINAL HYSTERECTOMY    ? total  ?? APPENDECTOMY    ?? BREAST EXCISIONAL BIOPSY Right YRS AGO  ? NEG  ?? CATARACT EXTRACTION W/PHACO Right 02/01/2016  ? Procedure: CATARACT EXTRACTION PHACO AND INTRAOCULAR LENS PLACEMENT (IOC);  Surgeon: Birder Robson, MD;  Location: [facility] ORS;  Service: Ophthalmology;  Laterality: Right;  [place]    00:37.2 ?AP%  20.7% ?CDE  7.69 ?fluid pack lot # [id number]  ?? CATARACT EXTRACTION W/PHACO Left 02/22/2016  ? Procedure: CATARACT EXTRACTION PHACO AND INTRAOCULAR LENS PLACEMENT (IOC);  Surgeon: Birder Robson, MD;  Location: [facility] ORS;  Service: Ophthalmology;  Laterality: Left;  [place] 00:40 ?AP% 19.9 ?CDE 8.06 ?fluid pack lot # [id number]  ?? CHOLECYSTECTOMY    ?? COLONOSCOPY WITH PROPOFOL N/A 01/13/2016  ? Procedure: COLONOSCOPY WITH PROPOFOL;  Surgeon: Manya Silvas, MD;  Location: [facility]  ENDOSCOPY;  Service: Endoscopy;  Laterality: N/A;  ?? COLONOSCOPY WITH PROPOFOL N/A 12/14/2021  ? Procedure: COLONOSCOPY WITH PROPOFOL;  Surgeon: Toledo, Benay Pike, MD;  Location: [facility] ENDOSCOPY;  Service: Gastroenterology;  Laterality: N/A;  ?? ERCP N/A 01/31/2022  ? Procedure: ENDOSCOPIC RETROGRADE CHOLANGIOPANCREATOGRAPHY (ERCP);  Surgeon: Lucilla Lame, MD;  Location: [facility] ENDOSCOPY;  Service: Endoscopy;  Laterality: N/A;  ?? EUS N/A 02/16/2022  ? Procedure: UPPER ENDOSCOPIC ULTRASOUND (EUS) LINEAR;  Surgeon: Holly Bodily, MD;  Location: [facility] ENDOSCOPY;  Service: Gastroenterology;  Laterality: N/A;  [facility]  ?? EYE SURGERY    ?? FOOT SURGERY Left   ? for breakdown of arch  ?? OVARIAN CYST SURGERY    ?? PORTACATH PLACEMENT Left 02/24/2022  ? Procedure: INSERTION PORT-A-CATH;  Surgeon: Robert Bellow, MD;  Location: [facility] ORS;  Service: General;  Laterality: Left;  ?? 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 Right [place]  ? ? ?FAMILY HISTORY :   ?Family History  ?Problem Relation Age of Onset  ?? Colon cancer Mother   ?     thought to be mets from breast cancer  ?? Stroke Mother   ?? Hypertension Mother   ?? Heart attack Mother   ?? Breast cancer Mother 99  ?? Emphysema Father   ?? Prostate cancer Father   ?? Breast cancer Maternal Aunt   ?  70's  ?? Throat cancer Daughter   ?? Rheum arthritis Daughter   ?? Hypertension Daughter   ?? Reye's syndrome Daughter   ?     history of  ?? Hepatitis C Daughter   ?? Ovarian cancer Neg Hx   ? ? ?SOCIAL HISTORY:   ?Social History  ? ?Tobacco Use  ?? Smoking status: Former  ?  Packs/day: 1.00  ?  Years: 30.00  ?  Pack years: 30.00  ?  Types: Cigarettes  ?  Quit date: 10/15/1996  ?  Years since quitting: 25.3  ?? Smokeless tobacco: Never  ?Vaping Use  ?? Vaping Use: Never used  ?Substance Use Topics  ?? Alcohol use: Yes  ?  Comment: wine rarely  ?? Drug use: No  ? ? ?ALLERGIES:  is allergic to penicillins, hydromorphone, meperidine hcl, codeine, and sulfa  antibiotics. ? ?MEDICATIONS:  ?Current Outpatient Medications  ?Medication Sig Dispense Refill  ?? ALPRAZolam (XANAX) 0.5 MG tablet Take 1 tablet (0.5 mg total) by mouth 2 (two) times daily as needed for anxiety. 15 t

## 2022-02-28 NOTE — Progress Notes (Signed)
Per Dr. Rogue Bussing- ok to proceed with Chemo today without mag level.  ?

## 2022-02-28 NOTE — Telephone Encounter (Signed)
Referral being sent to [place], Dr. Leilani Able ?

## 2022-02-28 NOTE — Patient Instructions (Signed)
[facility] CANCER CTR AT [place]  Discharge Instructions: ?Thank you for choosing [facility] to provide your oncology and hematology care.  ?If you have a lab appointment with the [facility], please go directly to the [facility] and check in at the registration area. ? ?Wear comfortable clothing and clothing appropriate for easy access to any Portacath or PICC line.  ? ?We strive to give you quality time with your provider. You may need to reschedule your appointment if you arrive late (15 or more minutes).  Arriving late affects you and other patients whose appointments are after yours.  Also, if you miss three or more appointments without notifying the office, you may be dismissed from the clinic at the provider?s discretion.    ?  ?For prescription refill requests, have your pharmacy contact our office and allow 72 hours for refills to be completed.   ? ?Today you received the following chemotherapy and/or immunotherapy agents IMFINZI, GEMZAR, CISPLATIN    ?  ?To help prevent nausea and vomiting after your treatment, we encourage you to take your nausea medication as directed. ? ?BELOW ARE SYMPTOMS THAT SHOULD BE REPORTED IMMEDIATELY: ?*FEVER GREATER THAN 100.4 F (38 ?C) OR HIGHER ?*CHILLS OR SWEATING ?*NAUSEA AND VOMITING THAT IS NOT CONTROLLED WITH YOUR NAUSEA MEDICATION ?*UNUSUAL SHORTNESS OF BREATH ?*UNUSUAL BRUISING OR BLEEDING ?*URINARY PROBLEMS (pain or burning when urinating, or frequent urination) ?*BOWEL PROBLEMS (unusual diarrhea, constipation, pain near the anus) ?TENDERNESS IN MOUTH AND THROAT WITH OR WITHOUT PRESENCE OF ULCERS (sore throat, sores in mouth, or a toothache) ?UNUSUAL RASH, SWELLING OR PAIN  ?UNUSUAL VAGINAL DISCHARGE OR ITCHING  ? ?Items with * indicate a potential emergency and should be followed up as soon as possible or go to the Emergency Department if any problems should occur. ? ?Please show the CHEMOTHERAPY ALERT CARD or IMMUNOTHERAPY ALERT CARD  at check-in to the Emergency Department and triage nurse. ? ?Should you have questions after your visit or need to cancel or reschedule your appointment, please contact [facility] CANCER [place] AT [place]  [phone] and follow the prompts.  Office hours are 8:00 a.m. to 4:30 p.m. Monday - Friday. Please note that voicemails left after 4:00 p.m. may not be returned until the following business day.  We are closed weekends and major holidays. You have access to a nurse at all times for urgent questions. Please call the main number to the clinic [phone] and follow the prompts. ? ?For any non-urgent questions, you may also contact your provider using MyChart. We now offer e-Visits for anyone 59 and older to request care online for non-urgent symptoms. For details visit [email or web address]. ?  ?Also download the MyChart app! Go to the app store, search "MyChart", open the app, select [facility], and log in with your MyChart username and password. ? ?Due to Covid, a mask is required upon entering the hospital/clinic. If you do not have a mask, one will be given to you upon arrival. For doctor visits, patients may have 1 support person aged 18 or older with them. For treatment visits, patients cannot have anyone with them due to current Covid guidelines and our immunocompromised population.  ? ?Cisplatin injection ?What is this medication? ?CISPLATIN (SIS pla tin) is a chemotherapy drug. It targets fast dividing cells, like cancer cells, and causes these cells to die. This medicine is used to treat many types of cancer like bladder, ovarian, and testicular cancers. ?This medicine may be used for other purposes; ask  your health care provider or pharmacist if you have questions. ?COMMON BRAND NAME(S): Platinol, Platinol -AQ ?What should I tell my care team before I take this medication? ?They need to know if you have any of these conditions: ?eye disease, vision problems ?hearing problems ?kidney  disease ?low blood counts, like white cells, platelets, or red blood cells ?tingling of the fingers or toes, or other nerve disorder ?an unusual or allergic reaction to cisplatin, carboplatin, oxaliplatin, other medicines, foods, dyes, or preservatives ?pregnant or trying to get pregnant ?breast-feeding ?How should I use this medication? ?This drug is given as an infusion into a vein. It is administered in a hospital or clinic by a specially trained health care professional. ?Talk to your pediatrician regarding the use of this medicine in children. Special care may be needed. ?Overdosage: If you think you have taken too much of this medicine contact a poison control center or emergency room at once. ?NOTE: This medicine is only for you. Do not share this medicine with others. ?What if I miss a dose? ?It is important not to miss a dose. Call your doctor or health care professional if you are unable to keep an appointment. ?What may interact with this medication? ?This medicine may interact with the following medications: ?foscarnet ?certain antibiotics like amikacin, gentamicin, neomycin, polymyxin B, streptomycin, tobramycin, vancomycin ?This list may not describe all possible interactions. Give your health care provider a list of all the medicines, herbs, non-prescription drugs, or dietary supplements you use. Also tell them if you smoke, drink alcohol, or use illegal drugs. Some items may interact with your medicine. ?What should I watch for while using this medication? ?Your condition will be monitored carefully while you are receiving this medicine. You will need important blood work done while you are taking this medicine. ?This drug may make you feel generally unwell. This is not uncommon, as chemotherapy can affect healthy cells as well as cancer cells. Report any side effects. Continue your course of treatment even though you feel ill unless your doctor tells you to stop. ?This medicine may increase your  risk of getting an infection. Call your healthcare professional for advice if you get a fever, chills, or sore throat, or other symptoms of a cold or flu. Do not treat yourself. Try to avoid being around people who are sick. ?Avoid taking medicines that contain aspirin, acetaminophen, ibuprofen, naproxen, or ketoprofen unless instructed by your healthcare professional. These medicines may hide a fever. ?This medicine may increase your risk to bruise or bleed. Call your doctor or health care professional if you notice any unusual bleeding. ?Be careful brushing and flossing your teeth or using a toothpick because you may get an infection or bleed more easily. If you have any dental work done, tell your dentist you are receiving this medicine. ?Do not become pregnant while taking this medicine or for 14 months after stopping it. Women should inform their healthcare professional if they wish to become pregnant or think they might be pregnant. Men should not father a child while taking this medicine and for 11 months after stopping it. There is potential for serious side effects to an unborn child. Talk to your healthcare professional for more information. ?Do not breast-feed an infant while taking this medicine. ?This medicine has caused ovarian failure in some women. This medicine may make it more difficult to get pregnant. Talk to your healthcare professional if you are concerned about your fertility. ?This medicine has caused decreased sperm counts in  some men. This may make it more difficult to father a child. Talk to your healthcare professional if you are concerned about your fertility. ?Drink fluids as directed while you are taking this medicine. This will help protect your kidneys. ?Call your doctor or health care professional if you get diarrhea. Do not treat yourself. ?What side effects may I notice from receiving this medication? ?Side effects that you should report to your doctor or health care professional  as soon as possible: ?allergic reactions like skin rash, itching or hives, swelling of the face, lips, or tongue ?blurred vision ?changes in vision ?decreased hearing or ringing of the ears ?nausea, vomiting

## 2022-02-28 NOTE — Assessment & Plan Note (Addendum)
#  Cholangiocarcinoma stage IV-insufficient tissue for NGS; possibly MMR testing.  Liquid biopsy guardant testing-Pending ? ?# I would recommend palliative chemotherapy-immunotherapy- gemcitabine-Cisplatin [split dose] with Durvalumab/Immunotherapy.  Understands treatments are not curative. ? ?#Proceed with cycle #1 of gemcitabine [$RemoveBeforeD'800mg'WOXRCCItOCIgtu$ /m2 day 1-8] cisplatin [ 30 mg per metered square day 1 day 8] with durvalumab every 21 days.  Again reviewed the potential  side effects including but not limited to-increasing fatigue, nausea vomiting, diarrhea, hair loss, sores in the mouth, increase risk of infection and also neuropathy.  Also reviewed the potential side effects of immunotherapy.  Will need Udenyca post chemo.  ? ?#Obstructive jaundice status post ERCP status post stenting; LFTs/bilirubin improved.  Monitor closely. ? ?#Hypokalemia potassium 3.6; continue potassium supplementation.  ? ?#Patient planning to move to [place] ned  of June; will make a referral to oncology. Discussed with Mariea Clonts.  Recommend at least 2 cycles of chemotherapy before transitioning to [place]. ? ?# IV access: s/p MediPort- functional.   ? ?Cycle- 1; gem-800/cis-30 ? ?# DISPOSITION: ?# chemo today ?# 1 week- MD: labs- cbc/cmp;magnesium; gem-cis; D-2-udenyca - Dr.B ? ? ?

## 2022-02-28 NOTE — Research (Signed)
Research RN approached patient regarding participation in the Pilot Chair Exercise Protocol through [facility]. The protocol was explained briefly to the patient. She states she would want to participate but she is moving to [place] after this round of treatment to be with her family. Patient was thanked for her interest and being willing to speak with research. She states if they have this study in [place] she would participate.  ?Jeral Fruit, RN ?02/28/22 ?11:30 AM ? ?

## 2022-03-01 ENCOUNTER — Encounter: Payer: Self-pay | Admitting: Internal Medicine

## 2022-03-01 ENCOUNTER — Telehealth: Payer: Self-pay

## 2022-03-01 LAB — CANCER ANTIGEN 19-9: CA 19-9: 4491 U/mL — ABNORMAL HIGH (ref 0–35)

## 2022-03-01 NOTE — Telephone Encounter (Signed)
Telephone call to patient for follow up after receiving first infusion.   Patient states infusion went great.  States eating good and drinking plenty of fluids.   Denies any nausea or vomiting.  Encouraged patient to call for any concerns or questions. 

## 2022-03-08 ENCOUNTER — Encounter: Payer: Self-pay | Admitting: Internal Medicine

## 2022-03-08 ENCOUNTER — Inpatient Hospital Stay: Payer: Medicare Other

## 2022-03-08 ENCOUNTER — Telehealth: Payer: Self-pay | Admitting: Dietician

## 2022-03-08 ENCOUNTER — Inpatient Hospital Stay (HOSPITAL_BASED_OUTPATIENT_CLINIC_OR_DEPARTMENT_OTHER): Payer: Medicare Other | Admitting: Internal Medicine

## 2022-03-08 DIAGNOSIS — C221 Intrahepatic bile duct carcinoma: Secondary | ICD-10-CM

## 2022-03-08 DIAGNOSIS — Z5112 Encounter for antineoplastic immunotherapy: Secondary | ICD-10-CM | POA: Diagnosis not present

## 2022-03-08 LAB — CBC WITH DIFFERENTIAL/PLATELET
Abs Immature Granulocytes: 0.08 10*3/uL — ABNORMAL HIGH (ref 0.00–0.07)
Basophils Absolute: 0.1 10*3/uL (ref 0.0–0.1)
Basophils Relative: 1 %
Eosinophils Absolute: 0.2 10*3/uL (ref 0.0–0.5)
Eosinophils Relative: 2 %
HCT: 38.3 % (ref 36.0–46.0)
Hemoglobin: 12.7 g/dL (ref 12.0–15.0)
Immature Granulocytes: 1 %
Lymphocytes Relative: 24 %
Lymphs Abs: 2.2 10*3/uL (ref 0.7–4.0)
MCH: 29 pg (ref 26.0–34.0)
MCHC: 33.2 g/dL (ref 30.0–36.0)
MCV: 87.4 fL (ref 80.0–100.0)
Monocytes Absolute: 1 10*3/uL (ref 0.1–1.0)
Monocytes Relative: 11 %
Neutro Abs: 5.7 10*3/uL (ref 1.7–7.7)
Neutrophils Relative %: 61 %
Platelets: 186 10*3/uL (ref 150–400)
RBC: 4.38 MIL/uL (ref 3.87–5.11)
RDW: 12.4 % (ref 11.5–15.5)
WBC: 9.1 10*3/uL (ref 4.0–10.5)
nRBC: 0 % (ref 0.0–0.2)

## 2022-03-08 LAB — COMPREHENSIVE METABOLIC PANEL
ALT: 22 U/L (ref 0–44)
AST: 22 U/L (ref 15–41)
Albumin: 3.7 g/dL (ref 3.5–5.0)
Alkaline Phosphatase: 179 U/L — ABNORMAL HIGH (ref 38–126)
Anion gap: 11 (ref 5–15)
BUN: 14 mg/dL (ref 8–23)
CO2: 25 mmol/L (ref 22–32)
Calcium: 9 mg/dL (ref 8.9–10.3)
Chloride: 94 mmol/L — ABNORMAL LOW (ref 98–111)
Creatinine, Ser: 1 mg/dL (ref 0.44–1.00)
GFR, Estimated: 58 mL/min — ABNORMAL LOW (ref >60)
Glucose, Bld: 141 mg/dL — ABNORMAL HIGH (ref 70–99)
Potassium: 3.6 mmol/L (ref 3.5–5.1)
Sodium: 130 mmol/L — ABNORMAL LOW (ref 135–145)
Total Bilirubin: 0.6 mg/dL (ref 0.3–1.2)
Total Protein: 8.3 g/dL — ABNORMAL HIGH (ref 6.5–8.1)

## 2022-03-08 LAB — MAGNESIUM: Magnesium: 1.9 mg/dL (ref 1.7–2.4)

## 2022-03-08 MED FILL — Dexamethasone Sodium Phosphate Inj 100 MG/10ML: INTRAMUSCULAR | Qty: 1 | Status: AC

## 2022-03-08 MED FILL — Fosaprepitant Dimeglumine For IV Infusion 150 MG (Base Eq): INTRAVENOUS | Qty: 5 | Status: AC

## 2022-03-08 NOTE — Assessment & Plan Note (Addendum)
#  Cholangiocarcinoma stage IV-insufficient tissue for NGS; possibly MMR testing.  Liquid biopsy guardant testing; Currently on  gemcitabine [$RemoveBefor'800mg'AtxEumFfoOUS$ /m2 day 1-8] cisplatin [ 30 mg per metered square day 1 day 8] with durvalumab every 21 days.   #Proceed with cycle #1; day 8 chemo gemcitabine tomorrow. Labs today reviewed;  acceptable for treatment today. D-3 Ellen Henri.  Discussed the joint pains from growth factors.  Recommend claritin once a day x7 days.   #Obstructive jaundice status post ERCP status post stenting; LFTs/bilirubin improved.  Monitor closely. STABLE.   #Hypokalemia potassium 3.6; continue potassium supplementation.   #Transition of care: Patient plan to move to [place] in middle of June.  As per the family patient has an appointment with oncologist in [place]-cycle #2 week #3.   # IV access: s/p MediPort- functional.    Cycle- 1; gem-800/cis-30; durva  # DISPOSITION: # as planned chemo tomorrow- ; D-2-udenyca #  2 week- MD: labs- cbc/cmp;Ca-19-9; magnesium; gem-cis-Durvalumab;  # in 3 weeks- labs- cbc/cmp; gem-cis; D-2 Ellen Henri - Dr.B

## 2022-03-08 NOTE — Progress Notes (Signed)
Pt states she is weak and off balance today. Started right before she came here. Happen when she is stepping up.  She feels like a rock is sitting on her stomach since last treatment.  Constipated, miralax makes her feel sick. But wanders if it may have been the lemonade Instead. Can she take dulcolax?

## 2022-03-08 NOTE — Progress Notes (Signed)
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 Despite this, enhancing, infiltrative mass centered within the central left hepatic lobe amputate the biliary tree of the segments 2 and 3 and abuts the confluence of the hepatic ducts and is most in keeping with an infiltrative cholangiocarcinoma. This measures approximately 3.5 x 4.5 cm in greatest dimension. Extensive associated pathologic hilar and periportal lymphadenopathy results in subsequent obstruction of the mid common duct secondary to extrinsic compression.   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   Pathologic remote retroperitoneal adenopathy within the aortocaval and left periaortic lymph node groups.   Correlation with endoscopic ultrasound would be helpful for further evaluation     Electronically Signed   By: Fidela Salisbury M.D.   On: 01/28/2022 03:29  IMPRESSION: 1. Hypermetabolic left hepatic lobe cholangiocarcinoma with metastatic adenopathy in the chest and abdomen. 2. Hypermetabolic right adrenal nodule worrisome for a metastasis. 3. Small pericardial effusion, minimally increased from 01/29/2022. 4. Left adrenal adenoma.  No  follow-up necessary. 5. Punctate right renal stones. 6. Aortic atherosclerosis (ICD10-I70.0). Coronary artery calcification.     Electronically Signed   By: Lorin Picket M.D.   On: 02/15/2022 09:13  DIAGNOSIS:  A. PERIPANCREATIC MASS; EUS-ASSISTED FNA:  - POSITIVE FOR MALIGNANCY.  - ADENOCARCINOMA IS PRESENT.   There is insufficient material for ancillary molecular testing.   The specimen is adequate for interpretation.   # MAY 16th, 2023- Gem-Cis- Durvalumab.    Cholangiocarcinoma of liver ([place])  01/29/2022 Initial Diagnosis   Cholangiocarcinoma of liver ([place])    02/21/2022 Cancer Staging   Staging form: Intrahepatic Bile Duct, AJCC 8th Edition - Clinical: Stage IV (cT1a, cN1, cM1) - Signed by Cammie Sickle, MD on 02/21/2022 Stage prefix: Initial diagnosis    02/28/2022 -  Chemotherapy   Patient is on Treatment Plan : BLADDER Gemcitabine D1,8 + Cisplatin (split dose) D1,8 q21d x 4 cycles        INTERVAL HISTORY: Ambulating independently.  Accompanied by her daughter.  Marcia Livingston 76 y.o.  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  Patient s/p cycle number 1 day 1 approximately 1 week ago.  Denies any significant nausea vomiting.  Complains of mild fatigue.  Otherwise no fever no chills.  No cough.  Denies any worsening back pain.  Denies any tingling or numbness in the extremities.  Review of Systems  Constitutional:  Positive for malaise/fatigue and weight loss. Negative for chills, diaphoresis and fever.  HENT:  Negative for nosebleeds and sore throat.   Eyes:  Negative for double vision.  Respiratory:  Negative for cough, hemoptysis, sputum production, shortness of breath and wheezing.   Cardiovascular:  Negative for chest pain, palpitations, orthopnea and leg swelling.  Gastrointestinal:  Negative  for abdominal pain, blood  in stool, constipation, diarrhea, heartburn, melena, nausea and vomiting.  Genitourinary:  Negative for dysuria, frequency and urgency.  Musculoskeletal:  Positive for back pain and joint pain.  Skin: Negative.  Negative for itching and rash.  Neurological:  Negative for dizziness, tingling, focal weakness, weakness and headaches.  Endo/Heme/Allergies:  Does not bruise/bleed easily.  Psychiatric/Behavioral:  Negative for depression. The patient is not nervous/anxious and does not have insomnia.      PAST MEDICAL HISTORY :  Past Medical History:  Diagnosis Date   Anxiety    Arthritis    CAD (coronary artery disease)    Cancer (HCC)    SKIN   Colon polyps    COPD (chronic obstructive pulmonary disease) (HCC)    Depression    Depression    Fibrocystic breast disease    GERD (gastroesophageal reflux disease)    Heart murmur    High cholesterol    Hyperlipidemia    Hypertension    IBS (irritable bowel syndrome)    IBS (irritable bowel syndrome)    Neuromuscular disorder (HCC)    Obesity    Osteoporosis    Overactive bladder    Sleep apnea    Sleep apnea     PAST SURGICAL HISTORY :   Past Surgical History:  Procedure Laterality Date   ABDOMINAL HYSTERECTOMY     total   APPENDECTOMY     BREAST EXCISIONAL BIOPSY Right YRS AGO   NEG   CATARACT EXTRACTION W/PHACO Right 02/01/2016   Procedure: CATARACT EXTRACTION PHACO AND INTRAOCULAR LENS PLACEMENT ([place]);  Surgeon: Birder Robson, MD;  Location: [facility] ORS;  Service: Ophthalmology;  Laterality: Right;  [place]    00:37.2 AP%  20.7% CDE  7.69 fluid pack lot # [id number]   CATARACT EXTRACTION W/PHACO Left 02/22/2016   Procedure: CATARACT EXTRACTION PHACO AND INTRAOCULAR LENS PLACEMENT (IOC);  Surgeon: Birder Robson, MD;  Location: [facility] ORS;  Service: Ophthalmology;  Laterality: Left;  [place] 00:40 AP% 19.9 CDE 8.06 fluid pack lot # [id number]   CHOLECYSTECTOMY     COLONOSCOPY WITH PROPOFOL N/A 01/13/2016   Procedure: COLONOSCOPY  WITH PROPOFOL;  Surgeon: Manya Silvas, MD;  Location[facility] ENDOSCOPY;  Service: Endoscopy;  Laterality: N/A;   COLONOSCOPY WITH PROPOFOL N/A 12/14/2021   Procedure: COLONOSCOPY WITH PROPOFOL;  Surgeon: Toledo, Benay Pike, MD;  Location: [facility] ENDOSCOPY;  Service: Gastroenterology;  Laterality: N/A;   ERCP N/A 01/31/2022   Procedure: ENDOSCOPIC RETROGRADE CHOLANGIOPANCREATOGRAPHY (ERCP);  Surgeon: Lucilla Lame, MD;  Location: [facility] ENDOSCOPY;  Service: Endoscopy;  Laterality: N/A;   EUS N/A 02/16/2022   Procedure: UPPER ENDOSCOPIC ULTRASOUND (EUS) LINEAR;  Surgeon: Holly Bodily, MD;  Location: [facility] ENDOSCOPY;  Service: Gastroenterology;  Laterality: N/A;  [facility]   EYE SURGERY     FOOT SURGERY Left    for breakdown of arch   OVARIAN CYST SURGERY     PORTACATH PLACEMENT Left 02/24/2022   Procedure: INSERTION PORT-A-CATH;  Surgeon: Robert Bellow, MD;  Location: [facility] ORS;  Service: General;  Laterality: Left;   SKIN CANCER EXCISION Right 05/16/2017   Right Shin    FAMILY HISTORY :   Family History  Problem Relation Age of Onset   Colon cancer Mother        thought to be mets from breast cancer   Stroke Mother    Hypertension Mother    Heart attack Mother    Breast cancer Mother 69   Emphysema Father    Prostate cancer  Father    Breast cancer Maternal Aunt        70's   Throat cancer Daughter    Rheum arthritis Daughter    Hypertension Daughter    Reye's syndrome Daughter        history of   Hepatitis C Daughter    Ovarian cancer Neg Hx     SOCIAL HISTORY:   Social History   Tobacco Use   Smoking status: Former    Packs/day: 1.00    Years: 30.00    Pack years: 30.00    Types: Cigarettes    Quit date: 10/15/1996    Years since quitting: 25.4   Smokeless tobacco: Never  Vaping Use   Vaping Use: Never used  Substance Use Topics   Alcohol use: Yes    Comment: wine rarely   Drug use: No    ALLERGIES:  is allergic to penicillins, hydromorphone, meperidine  hcl, codeine, and sulfa antibiotics.  MEDICATIONS:  Current Outpatient Medications  Medication Sig Dispense Refill   ALPRAZolam (XANAX) 0.5 MG tablet Take 1 tablet (0.5 mg total) by mouth 2 (two) times daily as needed for anxiety. 15 tablet 0   aspirin EC 81 MG tablet Take 81 mg by mouth daily.     buPROPion (WELLBUTRIN XL) 150 MG 24 hr tablet TAKE 1 TABLET BY MOUTH  DAILY 90 tablet 1   busPIRone (BUSPAR) 15 MG tablet Take 15 mg by mouth 3 (three) times daily.     calcium carbonate (TUMS - DOSED IN MG ELEMENTAL CALCIUM) 500 MG chewable tablet Chew 1 tablet by mouth 3 (three) times daily with meals.     cholecalciferol (VITAMIN D) 1000 units tablet Take 1,000 Units by mouth daily.     colestipol (COLESTID) 1 g tablet TAKE 1 TABLET BY MOUTH  DAILY 90 tablet 0   DULoxetine (CYMBALTA) 60 MG capsule TAKE 1 CAPSULE BY MOUTH  DAILY 90 capsule 1   Fluticasone-Umeclidin-Vilant (TRELEGY ELLIPTA) 100-62.5-25 MCG/INH AEPB Inhale 1 puff into the lungs daily.      gabapentin (NEURONTIN) 300 MG capsule Take 300 mg by mouth 3 (three) times daily.     hydrochlorothiazide (HYDRODIURIL) 25 MG tablet TAKE 1 TABLET BY MOUTH  DAILY 90 tablet 3   lidocaine-prilocaine (EMLA) cream Apply on the port. 30 -45 min  prior to port access. 30 g 3   losartan (COZAAR) 50 MG tablet TAKE 1 TABLET BY MOUTH  DAILY 90 tablet 3   ondansetron (ZOFRAN) 4 MG tablet Take 1 tablet (4 mg total) by mouth every 6 (six) hours as needed for nausea. 20 tablet 0   ondansetron (ZOFRAN) 8 MG tablet One pill every 8 hours as needed for nausea/vomitting. 40 tablet 1   potassium chloride SA (KLOR-CON M) 20 MEQ tablet 1 pill twice a day 60 tablet 1   prochlorperazine (COMPAZINE) 10 MG tablet Take 1 tablet (10 mg total) by mouth every 6 (six) hours as needed for nausea or vomiting. 40 tablet 1   traZODone (DESYREL) 50 MG tablet Take 1-2 tablets at bedtime for sleep. 180 tablet 1   vitamin B-12 (CYANOCOBALAMIN) 500 MCG tablet Take 500 mcg by mouth  daily.     rosuvastatin (CRESTOR) 5 MG tablet Take 5 mg by mouth 3 (three) times a week.     No current facility-administered medications for this visit.    PHYSICAL EXAMINATION: ECOG PERFORMANCE STATUS: 1 - Symptomatic but completely ambulatory  BP (!) 126/97 (BP Location: Left Arm, Patient Position: Sitting, Cuff  Size: Normal)   Pulse 90   Temp 97.9 F (36.6 C) (Tympanic)   Ht '5\' 1"'  (1.549 m)   Wt 193 lb (87.5 kg)   SpO2 97%   BMI 36.47 kg/m   Filed Weights   03/08/22 1336  Weight: 193 lb (87.5 kg)    Physical Exam Vitals and nursing note reviewed.  HENT:     Head: Normocephalic and atraumatic.     Mouth/Throat:     Pharynx: Oropharynx is clear.  Eyes:     Extraocular Movements: Extraocular movements intact.     Pupils: Pupils are equal, round, and reactive to light.  Cardiovascular:     Rate and Rhythm: Normal rate and regular rhythm.  Pulmonary:     Comments: Decreased breath sounds bilaterally.  Abdominal:     Palpations: Abdomen is soft.  Musculoskeletal:        General: Normal range of motion.     Cervical back: Normal range of motion.  Skin:    General: Skin is warm.  Neurological:     General: No focal deficit present.     Mental Status: She is alert and oriented to person, place, and time.  Psychiatric:        Behavior: Behavior normal.        Judgment: Judgment normal.      LABORATORY DATA:  I have reviewed the data as listed    Component Value Date/Time   NA 130 (L) 03/08/2022 1331   NA 141 01/30/2020 1044   K 3.6 03/08/2022 1331   CL 94 (L) 03/08/2022 1331   CO2 25 03/08/2022 1331   GLUCOSE 141 (H) 03/08/2022 1331   BUN 14 03/08/2022 1331   BUN 9 01/30/2020 1044   CREATININE 1.00 03/08/2022 1331   CREATININE 0.86 02/27/2012 0912   CALCIUM 9.0 03/08/2022 1331   PROT 8.3 (H) 03/08/2022 1331   PROT 7.0 01/30/2020 1044   ALBUMIN 3.7 03/08/2022 1331   ALBUMIN 4.3 01/30/2020 1044   AST 22 03/08/2022 1331   ALT 22 03/08/2022 1331    ALKPHOS 179 (H) 03/08/2022 1331   BILITOT 0.6 03/08/2022 1331   BILITOT 0.4 01/30/2020 1044   GFRNONAA 58 (L) 03/08/2022 1331   GFRNONAA >60 02/27/2012 0912   GFRAA 74 01/30/2020 1044   GFRAA >60 02/27/2012 0912    No results found for: SPEP, UPEP  Lab Results  Component Value Date   WBC 9.1 03/08/2022   NEUTROABS 5.7 03/08/2022   HGB 12.7 03/08/2022   HCT 38.3 03/08/2022   MCV 87.4 03/08/2022   PLT 186 03/08/2022      Chemistry      Component Value Date/Time   NA 130 (L) 03/08/2022 1331   NA 141 01/30/2020 1044   K 3.6 03/08/2022 1331   CL 94 (L) 03/08/2022 1331   CO2 25 03/08/2022 1331   BUN 14 03/08/2022 1331   BUN 9 01/30/2020 1044   CREATININE 1.00 03/08/2022 1331   CREATININE 0.86 02/27/2012 0912      Component Value Date/Time   CALCIUM 9.0 03/08/2022 1331   ALKPHOS 179 (H) 03/08/2022 1331   AST 22 03/08/2022 1331   ALT 22 03/08/2022 1331   BILITOT 0.6 03/08/2022 1331   BILITOT 0.4 01/30/2020 1044       RADIOGRAPHIC STUDIES: I have personally reviewed the radiological images as listed and agreed with the findings in the report. No results found.   ASSESSMENT & PLAN:  Cholangiocarcinoma of liver ([place]) #Cholangiocarcinoma stage IV-insufficient tissue  for NGS; possibly MMR testing.  Liquid biopsy guardant testing; Currently on  gemcitabine [873m/m2 day 1-8] cisplatin [ 30 mg per metered square day 1 day 8] with durvalumab every 21 days.   #Proceed with cycle #1; day 8 chemo gemcitabine tomorrow. Labs today reviewed;  acceptable for treatment today. D-3 uEllen Henri  Discussed the joint pains from growth factors.  Recommend claritin once a day x7 days.   #Obstructive jaundice status post ERCP status post stenting; LFTs/bilirubin improved.  Monitor closely. STABLE.   #Hypokalemia potassium 3.6; continue potassium supplementation.   #[place] of [place]: [place]  As per the family patient has an appointment with  oncologist in [place]-cycle #2 week #3.   # IV access: s/p MediPort- functional.    Cycle- 1; gem-800/cis-30; durva  # DISPOSITION: # as planned chemo tomorrow- ; D-2-udenyca #  2 week- MD: labs- cbc/cmp;Ca-19-9; magnesium; gem-cis-Durvalumab;  # in 3 weeks- labs- cbc/cmp; gem-cis; D-2 uEllen Henri- Dr.B   Orders Placed This Encounter  Procedures   CBC with Differential/Platelet    Standing Status:   Standing    Number of Occurrences:   2    Standing Expiration Date:   03/09/2023   Comprehensive metabolic panel    Standing Status:   Standing    Number of Occurrences:   2    Standing Expiration Date:   03/09/2023   Cancer antigen 19-9    Standing Status:   Future    Standing Expiration Date:   03/09/2023   Magnesium    Standing Status:   Future    Standing Expiration Date:   03/09/2023   All questions were answered. The patient knows to call the clinic with any problems, questions or concerns.      GCammie Sickle MD 03/13/2022 4:20 PM

## 2022-03-08 NOTE — Patient Instructions (Signed)
#  Take claritin once a day x7 days-starting 5/26.

## 2022-03-08 NOTE — Telephone Encounter (Signed)
Nutrition Assessment-  Called patient at home phone#   Reason for Assessment: MST screen for weight loss.    ASSESSMENT: Patient is a 76 year old female with stage IV intrahepatic cholangiocarcinoma. She is undergoing gemcitabine and cisplatin treatment and followed by Dr. Rogue Bussing.  PMHx includes: Pre-DM, OSA on CPAP, IBS, COPD, anxiety and depression, colon polyps   She was screened on MST during recent hospitalization. IP RD attempted to see her, but she was sleeping and family provided usual PO which reflects intolerance to creamy ONS, but she was given Boost Breeze in the hospital.  Patient reports she is feeling better and appetite has improved. Usual PO currently one main meal with grazing throughout the day. Daughters cook for her meat, potato green vegetable.  Grazing on anything cold, complaints of dry mouth occasional nausea especially with too much water on empty stomach, and some constipation. Usual fluid intake 64 oz of water.   Anthropometrics:   Height: 61" Weight:  02/28/22  193.6# 01/31/22   201# 12/14/21    205#  UBW: 210-211 BMI: 36.58  INTERVENTION: Reviewed some strategies for nausea and constipation. Encouraged weight maintenance and high protein choices with snacking. Emailed Nutrition tips for nausea and High Protein High Calorie Snacking. Encouraged increasing fluids to goal of 100 oz to hydrate and help with dry mouth and constipation. Encouraged sugar free teas, Gingerale, or flavored waters.   MONITORING, EVALUATION, GOAL: weight trends, PO intake   Next Visit: PRN at patient or provider request.  April Manson, RDN, LDN Registered Dietitian, [place] Part Time Remote (Usual office hours: Tuesday-Thursday) Cell: [phone]

## 2022-03-09 ENCOUNTER — Inpatient Hospital Stay: Payer: Medicare Other

## 2022-03-09 VITALS — BP 131/62 | HR 87 | Temp 97.6°F | Resp 18 | Wt 190.5 lb

## 2022-03-09 DIAGNOSIS — C221 Intrahepatic bile duct carcinoma: Secondary | ICD-10-CM

## 2022-03-09 DIAGNOSIS — Z5112 Encounter for antineoplastic immunotherapy: Secondary | ICD-10-CM | POA: Diagnosis not present

## 2022-03-09 MED ORDER — SODIUM CHLORIDE 0.9% FLUSH
10.0000 mL | INTRAVENOUS | Status: DC | PRN
  Administered 2022-03-09: 10 mL
  Filled 2022-03-09: qty 10

## 2022-03-09 MED ORDER — SODIUM CHLORIDE 0.9 % IV SOLN
Freq: Once | INTRAVENOUS | Status: AC
  Filled 2022-03-09: qty 250

## 2022-03-09 MED ORDER — POTASSIUM CHLORIDE IN NACL 20-0.9 MEQ/L-% IV SOLN
Freq: Once | INTRAVENOUS | Status: AC
  Filled 2022-03-09: qty 1000

## 2022-03-09 MED ORDER — SODIUM CHLORIDE 0.9 % IV SOLN
150.0000 mg | Freq: Once | INTRAVENOUS | Status: AC
  Administered 2022-03-09: 150 mg via INTRAVENOUS
  Filled 2022-03-09: qty 150

## 2022-03-09 MED ORDER — PALONOSETRON HCL INJECTION 0.25 MG/5ML
0.2500 mg | Freq: Once | INTRAVENOUS | Status: AC
  Administered 2022-03-09: 0.25 mg via INTRAVENOUS

## 2022-03-09 MED ORDER — SODIUM CHLORIDE 0.9 % IV SOLN
10.0000 mg | Freq: Once | INTRAVENOUS | Status: AC
  Administered 2022-03-09: 10 mg via INTRAVENOUS
  Filled 2022-03-09: qty 10

## 2022-03-09 MED ORDER — HEPARIN SOD (PORK) LOCK FLUSH 100 UNIT/ML IV SOLN
500.0000 [IU] | Freq: Once | INTRAVENOUS | Status: AC | PRN
  Administered 2022-03-09: 500 [IU]
  Filled 2022-03-09: qty 5

## 2022-03-09 MED ORDER — SODIUM CHLORIDE 0.9 % IV SOLN
30.0000 mg/m2 | Freq: Once | INTRAVENOUS | Status: AC
  Administered 2022-03-09: 59 mg via INTRAVENOUS
  Filled 2022-03-09: qty 59

## 2022-03-09 MED ORDER — SODIUM CHLORIDE 0.9 % IV SOLN
800.0000 mg/m2 | Freq: Once | INTRAVENOUS | Status: AC
  Administered 2022-03-09: 1558 mg via INTRAVENOUS
  Filled 2022-03-09: qty 26.3

## 2022-03-09 NOTE — Progress Notes (Signed)
Patient tolerated Cisplatin/ Gemzar/ Potassium infusion well, no questions/concerns voiced. Patient stable at discharge. AVS given.

## 2022-03-09 NOTE — Patient Instructions (Addendum)
[facility] CANCER CTR AT [place]  Discharge Instructions: Thank you for choosing [facility] to provide your oncology and hematology care.  If you have a lab appointment with the [facility], please go directly to the [facility] and check in at the registration area.  Wear comfortable clothing and clothing appropriate for easy access to any Portacath or PICC line.   We strive to give you quality time with your provider. You may need to reschedule your appointment if you arrive late (15 or more minutes).  Arriving late affects you and other patients whose appointments are after yours.  Also, if you miss three or more appointments without notifying the office, you may be dismissed from the clinic at the provider's discretion.      For prescription refill requests, have your pharmacy contact our office and allow 72 hours for refills to be completed.    Today you received the following chemotherapy and/or immunotherapy agents: CISplatin/ Gemzar/ potassium   To help prevent nausea and vomiting after your treatment, we encourage you to take your nausea medication as directed.  BELOW ARE SYMPTOMS THAT SHOULD BE REPORTED IMMEDIATELY: *FEVER GREATER THAN 100.4 F (38 C) OR HIGHER *CHILLS OR SWEATING *NAUSEA AND VOMITING THAT IS NOT CONTROLLED WITH YOUR NAUSEA MEDICATION *UNUSUAL SHORTNESS OF BREATH *UNUSUAL BRUISING OR BLEEDING *URINARY PROBLEMS (pain or burning when urinating, or frequent urination) *BOWEL PROBLEMS (unusual diarrhea, constipation, pain near the anus) TENDERNESS IN MOUTH AND THROAT WITH OR WITHOUT PRESENCE OF ULCERS (sore throat, sores in mouth, or a toothache) UNUSUAL RASH, SWELLING OR PAIN  UNUSUAL VAGINAL DISCHARGE OR ITCHING   Items with * indicate a potential emergency and should be followed up as soon as possible or go to the Emergency Department if any problems should occur.  Please show the CHEMOTHERAPY ALERT CARD or IMMUNOTHERAPY ALERT CARD  at check-in to the Emergency Department and triage nurse.  Should you have questions after your visit or need to cancel or reschedule your appointment, please contact [facility] CANCER [place] AT [place]  [phone] and follow the prompts.  Office hours are 8:00 a.m. to 4:30 p.m. Monday - Friday. Please note that voicemails left after 4:00 p.m. may not be returned until the following business day.  We are closed weekends and major holidays. You have access to a nurse at all times for urgent questions. Please call the main number to the clinic [phone] and follow the prompts.  For any non-urgent questions, you may also contact your provider using MyChart. We now offer e-Visits for anyone 50 and older to request care online for non-urgent symptoms. For details visit [email or web address].   Also download the MyChart app! Go to the app store, search "MyChart", open the app, select [facility], and log in with your MyChart username and password.  Due to Covid, a mask is required upon entering the hospital/clinic. If you do not have a mask, one will be given to you upon arrival. For doctor visits, patients may have 1 support person aged 34 or older with them. For treatment visits, patients cannot have anyone with them due to current Covid guidelines and our immunocompromised population.

## 2022-03-10 ENCOUNTER — Inpatient Hospital Stay: Payer: Medicare Other

## 2022-03-10 DIAGNOSIS — C221 Intrahepatic bile duct carcinoma: Secondary | ICD-10-CM

## 2022-03-10 DIAGNOSIS — Z5112 Encounter for antineoplastic immunotherapy: Secondary | ICD-10-CM | POA: Diagnosis not present

## 2022-03-10 MED ORDER — PEGFILGRASTIM-CBQV 6 MG/0.6ML ~~LOC~~ SOSY
6.0000 mg | PREFILLED_SYRINGE | Freq: Once | SUBCUTANEOUS | Status: AC
  Administered 2022-03-10: 6 mg via SUBCUTANEOUS
  Filled 2022-03-10: qty 0.6

## 2022-03-13 ENCOUNTER — Encounter: Payer: Self-pay | Admitting: Internal Medicine

## 2022-03-14 ENCOUNTER — Other Ambulatory Visit: Payer: Medicare Other

## 2022-03-14 ENCOUNTER — Encounter: Payer: Self-pay | Admitting: Internal Medicine

## 2022-03-14 DIAGNOSIS — C221 Intrahepatic bile duct carcinoma: Secondary | ICD-10-CM

## 2022-03-14 NOTE — Telephone Encounter (Signed)
Lab orders entered

## 2022-03-15 ENCOUNTER — Inpatient Hospital Stay (HOSPITAL_BASED_OUTPATIENT_CLINIC_OR_DEPARTMENT_OTHER): Payer: Medicare Other | Admitting: Hospice and Palliative Medicine

## 2022-03-15 ENCOUNTER — Inpatient Hospital Stay: Payer: Medicare Other

## 2022-03-15 DIAGNOSIS — C221 Intrahepatic bile duct carcinoma: Secondary | ICD-10-CM

## 2022-03-15 DIAGNOSIS — Z95828 Presence of other vascular implants and grafts: Secondary | ICD-10-CM

## 2022-03-15 DIAGNOSIS — R5383 Other fatigue: Secondary | ICD-10-CM

## 2022-03-15 DIAGNOSIS — Z5112 Encounter for antineoplastic immunotherapy: Secondary | ICD-10-CM | POA: Diagnosis not present

## 2022-03-15 LAB — CBC WITH DIFFERENTIAL/PLATELET
Abs Immature Granulocytes: 0 10*3/uL (ref 0.00–0.07)
Band Neutrophils: 15 %
Basophils Absolute: 0 10*3/uL (ref 0.0–0.1)
Basophils Relative: 0 %
Eosinophils Absolute: 0 10*3/uL (ref 0.0–0.5)
Eosinophils Relative: 0 %
HCT: 36 % (ref 36.0–46.0)
Hemoglobin: 12 g/dL (ref 12.0–15.0)
Lymphocytes Relative: 13 %
Lymphs Abs: 2.7 10*3/uL (ref 0.7–4.0)
MCH: 29.9 pg (ref 26.0–34.0)
MCHC: 33.3 g/dL (ref 30.0–36.0)
MCV: 89.6 fL (ref 80.0–100.0)
Monocytes Absolute: 2.1 10*3/uL — ABNORMAL HIGH (ref 0.1–1.0)
Monocytes Relative: 10 %
Neutro Abs: 15.8 10*3/uL — ABNORMAL HIGH (ref 1.7–7.7)
Neutrophils Relative %: 62 %
Platelets: 176 10*3/uL (ref 150–400)
RBC: 4.02 MIL/uL (ref 3.87–5.11)
RDW: 13.2 % (ref 11.5–15.5)
Smear Review: NORMAL
WBC: 20.5 10*3/uL — ABNORMAL HIGH (ref 4.0–10.5)
nRBC: 0 % (ref 0.0–0.2)

## 2022-03-15 LAB — COMPREHENSIVE METABOLIC PANEL
ALT: 17 U/L (ref 0–44)
AST: 21 U/L (ref 15–41)
Albumin: 3.6 g/dL (ref 3.5–5.0)
Alkaline Phosphatase: 201 U/L — ABNORMAL HIGH (ref 38–126)
Anion gap: 10 (ref 5–15)
BUN: 13 mg/dL (ref 8–23)
CO2: 26 mmol/L (ref 22–32)
Calcium: 8.9 mg/dL (ref 8.9–10.3)
Chloride: 96 mmol/L — ABNORMAL LOW (ref 98–111)
Creatinine, Ser: 1.18 mg/dL — ABNORMAL HIGH (ref 0.44–1.00)
GFR, Estimated: 48 mL/min — ABNORMAL LOW (ref >60)
Glucose, Bld: 100 mg/dL — ABNORMAL HIGH (ref 70–99)
Potassium: 3.7 mmol/L (ref 3.5–5.1)
Sodium: 132 mmol/L — ABNORMAL LOW (ref 135–145)
Total Bilirubin: 0.5 mg/dL (ref 0.3–1.2)
Total Protein: 7.8 g/dL (ref 6.5–8.1)

## 2022-03-15 LAB — MAGNESIUM: Magnesium: 1.5 mg/dL — ABNORMAL LOW (ref 1.7–2.4)

## 2022-03-15 MED ORDER — HEPARIN SOD (PORK) LOCK FLUSH 100 UNIT/ML IV SOLN
500.0000 [IU] | Freq: Once | INTRAVENOUS | Status: AC
  Administered 2022-03-15: 500 [IU] via INTRAVENOUS
  Filled 2022-03-15: qty 5

## 2022-03-15 MED ORDER — DRONABINOL 2.5 MG PO CAPS: 2.5000 mg | ORAL_CAPSULE | Freq: Two times a day (BID) | ORAL | 0 refills | Status: AC

## 2022-03-15 MED ORDER — TRAMADOL HCL 50 MG PO TABS: 50.0000 mg | ORAL_TABLET | Freq: Two times a day (BID) | ORAL | 0 refills | Status: AC | PRN

## 2022-03-15 MED ORDER — SODIUM CHLORIDE 0.9 % IV SOLN
INTRAVENOUS | Status: DC
  Filled 2022-03-15: qty 250

## 2022-03-15 MED ORDER — SODIUM CHLORIDE 0.9% FLUSH
10.0000 mL | Freq: Once | INTRAVENOUS | Status: AC
  Administered 2022-03-15: 10 mL via INTRAVENOUS
  Filled 2022-03-15: qty 10

## 2022-03-15 NOTE — Progress Notes (Signed)
Pt add-on to [facility]. Has concerns of weakness, decreased oral intake, and generalized body aches/pains since treatment and gcsf injection last week. She also reports several episodes of intermittent diarrhea.

## 2022-03-15 NOTE — Progress Notes (Signed)
Symptom Management [place] at [facility] Telephone:([phone]) [phone] [phone]  Patient Care Team: Baxter Hire, MD as PCP - General (Internal Medicine) Birder Robson, MD as Referring Physician (Ophthalmology) Isaias Cowman, MD as Consulting Physician (Cardiology) Ottie Glazier, MD as Consulting Physician (Pulmonary Disease) Cammie Sickle, MD as Consulting Physician (Oncology)   Name of the patient: Marcia Livingston  [id number]  02-Apr-1946   Date of visit: 03/15/22  Reason for Consult: Marcia Livingston is a 76 y.o. 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  She has obstructive jaundice status post ERCP and stenting.  Patient is currently on treatment with cisplatin/gemcitabine/durvalumab, with patient receiving cycle 1 day 8 on 5/24.  She received Udenyca on 03/10/2022.  Patient presents to [facility] today with poor oral intake, pain, and overall not feeling well since her last treatment.  Patient reports that since receiving the Udenyca, she had some chest pain and back pain that appeared to be positional with movement.  She denies shortness of breath.  She has chronic morning cough with sinus drainage but this is unchanged in characteristic or severity.  She developed diarrhea following last chemotherapy and has had some nausea but these are improved.  Oral intake remains poor.  Denies any neurologic complaints. Denies recent fevers or illnesses. Denies any easy bleeding or bruising. Denies urinary complaints. Patient offers no further specific complaints today.  PAST MEDICAL HISTORY: Past Medical History:  Diagnosis Date   Anxiety    Arthritis    CAD (coronary artery disease)    Cancer (HCC)    SKIN   Colon polyps    COPD (chronic obstructive pulmonary disease) (HCC)    Depression    Depression    Fibrocystic breast disease    GERD (gastroesophageal  reflux disease)    Heart murmur    High cholesterol    Hyperlipidemia    Hypertension    IBS (irritable bowel syndrome)    IBS (irritable bowel syndrome)    Neuromuscular disorder (HCC)    Obesity    Osteoporosis    Overactive bladder    Sleep apnea    Sleep apnea     PAST SURGICAL HISTORY:  Past Surgical History:  Procedure Laterality Date   ABDOMINAL HYSTERECTOMY     total   APPENDECTOMY     BREAST EXCISIONAL BIOPSY Right YRS AGO   NEG   CATARACT EXTRACTION W/PHACO Right 02/01/2016   Procedure: CATARACT EXTRACTION PHACO AND INTRAOCULAR LENS PLACEMENT ([place]);  Surgeon: Birder Robson, MD;  Location: [facility] ORS;  Service: Ophthalmology;  Laterality: Right;  [place]    00:37.2 AP%  20.7% CDE  7.69 fluid pack lot # [id number]   CATARACT EXTRACTION W/PHACO Left 02/22/2016   Procedure: CATARACT EXTRACTION PHACO AND INTRAOCULAR LENS PLACEMENT (IOC);  Surgeon: Birder Robson, MD;  Location: [facility] ORS;  Service: Ophthalmology;  Laterality: Left;  [place] 00:40 AP% 19.9 CDE 8.06 fluid pack lot # [id number]   CHOLECYSTECTOMY     COLONOSCOPY WITH PROPOFOL N/A 01/13/2016   Procedure: COLONOSCOPY WITH PROPOFOL;  Surgeon: Manya Silvas, MD;  Location: [facility] ENDOSCOPY;  Service: Endoscopy;  Laterality: N/A;   COLONOSCOPY WITH PROPOFOL N/A 12/14/2021   Procedure: COLONOSCOPY WITH PROPOFOL;  Surgeon: Toledo, Benay Pike, MD;  Location: [facility] ENDOSCOPY;  Service: Gastroenterology;  Laterality: N/A;   ERCP N/A 01/31/2022   Procedure: ENDOSCOPIC RETROGRADE CHOLANGIOPANCREATOGRAPHY (ERCP);  Surgeon: Lucilla Lame, MD;  Location: [facility] ENDOSCOPY;  Service: Endoscopy;  Laterality: N/A;   EUS N/A 02/16/2022   Procedure: UPPER ENDOSCOPIC ULTRASOUND (EUS) LINEAR;  Surgeon: Holly Bodily, MD;  Location: [facility] ENDOSCOPY;  Service: Gastroenterology;  Laterality: N/A;  [employer]   EYE SURGERY     FOOT SURGERY Left    for breakdown of arch   OVARIAN CYST SURGERY     PORTACATH PLACEMENT Left 02/24/2022   Procedure:  INSERTION PORT-A-CATH;  Surgeon: Robert Bellow, MD;  Location: [facility] ORS;  Service: General;  Laterality: Left;   SKIN CANCER EXCISION Right 05/16/2017   Right [place]    HEMATOLOGY/ONCOLOGY HISTORY:  Oncology History Overview Note  IMPRESSION: Technically limited examination due to extensive respiratory motion artifact. Despite this, enhancing, infiltrative mass centered within the central left hepatic lobe amputate the biliary tree of the segments 2 and 3 and abuts the confluence of the hepatic ducts and is most in keeping with an infiltrative cholangiocarcinoma. This measures approximately 3.5 x 4.5 cm in greatest dimension. Extensive associated pathologic hilar and periportal lymphadenopathy results in subsequent obstruction of the mid common duct secondary to extrinsic compression.   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   Pathologic remote retroperitoneal adenopathy within the aortocaval and left periaortic lymph node groups.   Correlation with endoscopic ultrasound would be helpful for further evaluation     Electronically Signed   By: Fidela Salisbury M.D.   On: 01/28/2022 03:29  IMPRESSION: 1. Hypermetabolic left hepatic lobe cholangiocarcinoma with metastatic adenopathy in the chest and abdomen. 2. Hypermetabolic right adrenal nodule worrisome for a metastasis. 3. Small pericardial effusion, minimally increased from 01/29/2022. 4. Left adrenal adenoma.  No follow-up necessary. 5. Punctate right renal stones. 6. Aortic atherosclerosis (ICD10-I70.0). Coronary artery calcification.     Electronically Signed   By: Lorin Picket M.D.   On: 02/15/2022 09:13  DIAGNOSIS:  A. PERIPANCREATIC MASS; EUS-ASSISTED FNA:  - POSITIVE FOR MALIGNANCY.  - ADENOCARCINOMA IS PRESENT.   There is insufficient material for ancillary molecular testing.   The specimen is  adequate for interpretation.   # MAY 16th, 2023- Gem-Cis- Durvalumab.    Cholangiocarcinoma of liver ([place])  01/29/2022 Initial Diagnosis   Cholangiocarcinoma of liver ([place])    02/21/2022 Cancer Staging   Staging form: Intrahepatic Bile Duct, AJCC 8th Edition - Clinical: Stage IV (cT1a, cN1, cM1) - Signed by Cammie Sickle, MD on 02/21/2022 Stage prefix: Initial diagnosis    02/28/2022 -  Chemotherapy   Patient is on Treatment Plan : BLADDER Gemcitabine D1,8 + Cisplatin (split dose) D1,8 q21d x 4 cycles        ALLERGIES:  is allergic to penicillins, hydromorphone, meperidine hcl, codeine, and sulfa antibiotics.  MEDICATIONS:  Current Outpatient Medications  Medication Sig Dispense Refill   ALPRAZolam (XANAX) 0.5 MG tablet Take 1 tablet (0.5 mg total) by mouth 2 (two) times daily as needed for anxiety. 15 tablet 0   aspirin EC 81 MG tablet Take 81 mg by mouth daily.     buPROPion (WELLBUTRIN XL) 150 MG 24 hr tablet TAKE 1 TABLET BY MOUTH  DAILY 90 tablet 1   busPIRone (BUSPAR) 15 MG tablet Take 15 mg by mouth 3 (three) times daily.     calcium carbonate (TUMS - DOSED IN MG ELEMENTAL CALCIUM) 500 MG chewable tablet Chew 1 tablet by mouth 3 (three) times daily with meals.     cholecalciferol (VITAMIN D) 1000 units tablet Take 1,000  Units by mouth daily.     colestipol (COLESTID) 1 g tablet TAKE 1 TABLET BY MOUTH  DAILY 90 tablet 0   DULoxetine (CYMBALTA) 60 MG capsule TAKE 1 CAPSULE BY MOUTH  DAILY 90 capsule 1   Fluticasone-Umeclidin-Vilant (TRELEGY ELLIPTA) 100-62.5-25 MCG/INH AEPB Inhale 1 puff into the lungs daily.      gabapentin (NEURONTIN) 300 MG capsule Take 300 mg by mouth 3 (three) times daily.     hydrochlorothiazide (HYDRODIURIL) 25 MG tablet TAKE 1 TABLET BY MOUTH  DAILY 90 tablet 3   lidocaine-prilocaine (EMLA) cream Apply on the port. 30 -45 min  prior to port access. 30 g 3   losartan (COZAAR) 50 MG tablet TAKE 1 TABLET BY MOUTH  DAILY 90 tablet 3   ondansetron  (ZOFRAN) 4 MG tablet Take 1 tablet (4 mg total) by mouth every 6 (six) hours as needed for nausea. 20 tablet 0   ondansetron (ZOFRAN) 8 MG tablet One pill every 8 hours as needed for nausea/vomitting. 40 tablet 1   potassium chloride SA (KLOR-CON M) 20 MEQ tablet 1 pill twice a day 60 tablet 1   prochlorperazine (COMPAZINE) 10 MG tablet Take 1 tablet (10 mg total) by mouth every 6 (six) hours as needed for nausea or vomiting. 40 tablet 1   rosuvastatin (CRESTOR) 5 MG tablet Take 5 mg by mouth 3 (three) times a week.     traZODone (DESYREL) 50 MG tablet Take 1-2 tablets at bedtime for sleep. 180 tablet 1   vitamin B-12 (CYANOCOBALAMIN) 500 MCG tablet Take 500 mcg by mouth daily.     No current facility-administered medications for this visit.    VITAL SIGNS: There were no vitals taken for this visit. There were no vitals filed for this visit.  Estimated body mass index is 35.99 kg/m as calculated from the following:   Height as of 03/08/22: '5\' 1"'$  (1.549 m).   Weight as of 03/09/22: 190 lb 7.6 oz (86.4 kg).  LABS: CBC:    Component Value Date/Time   WBC 9.1 03/08/2022 1331   HGB 12.7 03/08/2022 1331   HGB 13.8 01/30/2020 1044   HCT 38.3 03/08/2022 1331   HCT 42.2 01/30/2020 1044   PLT 186 03/08/2022 1331   PLT 267 01/30/2020 1044   MCV 87.4 03/08/2022 1331   MCV 89 01/30/2020 1044   NEUTROABS 5.7 03/08/2022 1331   NEUTROABS 3.9 01/30/2020 1044   LYMPHSABS 2.2 03/08/2022 1331   LYMPHSABS 2.7 01/30/2020 1044   MONOABS 1.0 03/08/2022 1331   EOSABS 0.2 03/08/2022 1331   EOSABS 0.3 01/30/2020 1044   BASOSABS 0.1 03/08/2022 1331   BASOSABS 0.1 01/30/2020 1044   Comprehensive Metabolic Panel:    Component Value Date/Time   NA 130 (L) 03/08/2022 1331   NA 141 01/30/2020 1044   K 3.6 03/08/2022 1331   CL 94 (L) 03/08/2022 1331   CO2 25 03/08/2022 1331   BUN 14 03/08/2022 1331   BUN 9 01/30/2020 1044   CREATININE 1.00 03/08/2022 1331   CREATININE 0.86 02/27/2012 0912   GLUCOSE  141 (H) 03/08/2022 1331   CALCIUM 9.0 03/08/2022 1331   AST 22 03/08/2022 1331   ALT 22 03/08/2022 1331   ALKPHOS 179 (H) 03/08/2022 1331   BILITOT 0.6 03/08/2022 1331   BILITOT 0.4 01/30/2020 1044   PROT 8.3 (H) 03/08/2022 1331   PROT 7.0 01/30/2020 1044   ALBUMIN 3.7 03/08/2022 1331   ALBUMIN 4.3 01/30/2020 1044    RADIOGRAPHIC STUDIES: NM PET  Image Initial (PI) Skull Base To Thigh (F-18 FDG)  Result Date: 02/15/2022 CLINICAL DATA:  Initial treatment strategy for cholangiocarcinoma. EXAM: NUCLEAR MEDICINE PET SKULL BASE TO THIGH TECHNIQUE: 11.0 mCi F-18 FDG was injected intravenously. Full-ring PET imaging was performed from the skull base to thigh after the radiotracer. CT data was obtained and used for attenuation correction and anatomic localization. Fasting blood glucose: 93 mg/dl COMPARISON:  CT chest 01/29/2022, MR abdomen 01/28/2022, CT abdomen pelvis 01/27/2022. FINDINGS: Mediastinal blood pool activity: SUV max 2.0 Liver activity: SUV max NA NECK: No abnormal hypermetabolism. Incidental CT findings: None. CHEST: Hypermetabolic prevascular/AP window adenopathy. Index lymph node measures 1.4 cm (2/76), SUV max 6.3. No hypermetabolic hilar or axillary lymph nodes. No hypermetabolic pulmonary nodules. Incidental CT findings: Atherosclerotic calcification of aorta, aortic valve and coronary arteries. Heart is enlarged. Small pericardial effusion, minimally increased from 01/29/2022. No pleural fluid. ABDOMEN/PELVIS: Ill-defined areas of hypermetabolism in the left hepatic lobe, SUV max, 7.9, corresponding to an infiltrative mass on comparison exams. Associated hypermetabolic periportal and abdominal retroperitoneal adenopathy. Index portacaval lymph node measures 1.8 cm (2/126), SUV max 5.8. Aortocaval lymph node measures 12 mm (2/145), SUV max 5.1. Hypermetabolic right adrenal nodule measures 2.0 cm, SUV max 3.7. No abnormal hypermetabolism in the left adrenal gland, spleen or pancreas.  Incidental CT findings: Infiltrative left hepatic lobe mass, better seen on MR abdomen 01/28/2022. Pneumobilia with a common bile duct wall stent terminating in the duodenum. 13 mm left adrenal nodule measures 12 Hounsfield units. Low-attenuation lesions in the kidneys measure up to 1.8 cm on the left. No follow-up necessary. Tiny stones in the right kidney. Spleen, pancreas, stomach and bowel are otherwise grossly unremarkable. Atherosclerotic calcification of the aorta. SKELETON: No abnormal hypermetabolism. Incidental CT findings: Degenerative changes in the spine. IMPRESSION: 1. Hypermetabolic left hepatic lobe cholangiocarcinoma with metastatic adenopathy in the chest and abdomen. 2. Hypermetabolic right adrenal nodule worrisome for a metastasis. 3. Small pericardial effusion, minimally increased from 01/29/2022. 4. Left adrenal adenoma.  No follow-up necessary. 5. Punctate right renal stones. 6. Aortic atherosclerosis (ICD10-I70.0). Coronary artery calcification. Electronically Signed   By: Lorin Picket M.D.   On: 02/15/2022 09:13   DG Chest Port 1 View  Result Date: 02/24/2022 CLINICAL DATA:  Left-sided Port-A-Cath placement. EXAM: PORTABLE CHEST 1 VIEW COMPARISON:  Chest x-ray February 02, 2022. FINDINGS: No consolidation. No visible pleural effusions or pneumothorax. Cardiomediastinal silhouette is similar to prior. No displaced fracture. IMPRESSION: Left subclavian approach Port-A-Cath with the tip projecting at the superior cavoatrial junction. No visible pneumothorax. Electronically Signed   By: Margaretha Sheffield M.D.   On: 02/24/2022 11:21   DG C-Arm 1-60 Min-No Report  Result Date: 02/24/2022 Fluoroscopy was utilized by the requesting physician.  No radiographic interpretation.    PERFORMANCE STATUS (ECOG) : 1 - Symptomatic but completely ambulatory  Review of Systems Unless otherwise noted, a complete review of systems is negative.  Physical Exam General: NAD Cardiovascular: regular  rate and rhythm Pulmonary: clear anterior/posterior fields Abdomen: soft, nontender, + bowel sounds GU: no suprapubic tenderness Extremities: no edema, no joint deformities Skin: no rashes Neurological: Weakness but otherwise nonfocal  Assessment and Plan- Patient is a 76 y.o. 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  She has obstructive jaundice status post ERCP and stenting.  Patient is currently on treatment with cisplatin/gemcitabine/durvalumab, with patient receiving cycle 1 day 8 on 5/24.  She received Udenyca on 03/10/2022.  Dehydration/poor oral intake-Labs consistent  with dehydration.  We will proceed with IV fluids today.  Discussed importance of increasing oral/fluid intake.  Recommend oral nutritional supplements daily.  Referral to nutritionist.  Granddaughter would like to trial an appetite stimulant.  Patient is on several antidepressants so would not use mirtazapine in this case.  Cannot use steroids given immunotherapy.  Would avoid Megace if possible given risk for DVTs.  We will trial Marinol as this might also help with nausea.   Muscle/bony aches -it sounds like these are positional so doubt cardiac/pulm etiology.  More likely musculoskeletal and possibly secondary to [facility].  Patient is on daily loratadine.   Unable to use steroids due to immunotherapy. Will give Rx for tramadol.   Nausea/diarrhea-antiemetics and antidiarrheals as needed.   RTC next week as previously scheduled.  ER precautions reviewed.  Patient can return sooner if needed for supportive care.   Patient expressed understanding and was in agreement with this plan. She also understands that She can call clinic at any time with any questions, concerns, or complaints.   Thank you for allowing me to participate in the care of this very pleasant patient.   Time Total: 20 minutes  Visit consisted of counseling and education  dealing with the complex and emotionally intense issues of symptom management in the setting of serious illness.Greater than 50%  of this time was spent counseling and coordinating care related to the above assessment and plan.  Signed by: Altha Harm, PhD, NP-C

## 2022-03-22 ENCOUNTER — Inpatient Hospital Stay (HOSPITAL_BASED_OUTPATIENT_CLINIC_OR_DEPARTMENT_OTHER): Payer: Medicare Other | Admitting: Internal Medicine

## 2022-03-22 ENCOUNTER — Encounter: Payer: Self-pay | Admitting: Internal Medicine

## 2022-03-22 ENCOUNTER — Inpatient Hospital Stay: Payer: Medicare Other | Attending: Internal Medicine

## 2022-03-22 DIAGNOSIS — Z5112 Encounter for antineoplastic immunotherapy: Secondary | ICD-10-CM | POA: Insufficient documentation

## 2022-03-22 DIAGNOSIS — Z79899 Other long term (current) drug therapy: Secondary | ICD-10-CM | POA: Diagnosis not present

## 2022-03-22 DIAGNOSIS — Z5111 Encounter for antineoplastic chemotherapy: Secondary | ICD-10-CM | POA: Insufficient documentation

## 2022-03-22 DIAGNOSIS — C221 Intrahepatic bile duct carcinoma: Secondary | ICD-10-CM | POA: Diagnosis present

## 2022-03-22 DIAGNOSIS — Z87891 Personal history of nicotine dependence: Secondary | ICD-10-CM | POA: Diagnosis not present

## 2022-03-22 DIAGNOSIS — E876 Hypokalemia: Secondary | ICD-10-CM | POA: Insufficient documentation

## 2022-03-22 DIAGNOSIS — Z7982 Long term (current) use of aspirin: Secondary | ICD-10-CM | POA: Diagnosis not present

## 2022-03-22 LAB — CBC WITH DIFFERENTIAL/PLATELET
Abs Immature Granulocytes: 1.79 10*3/uL — ABNORMAL HIGH (ref 0.00–0.07)
Basophils Absolute: 0.1 10*3/uL (ref 0.0–0.1)
Basophils Relative: 0 %
Eosinophils Absolute: 0.1 10*3/uL (ref 0.0–0.5)
Eosinophils Relative: 1 %
HCT: 33.3 % — ABNORMAL LOW (ref 36.0–46.0)
Hemoglobin: 10.9 g/dL — ABNORMAL LOW (ref 12.0–15.0)
Immature Granulocytes: 10 %
Lymphocytes Relative: 15 %
Lymphs Abs: 2.8 10*3/uL (ref 0.7–4.0)
MCH: 29.9 pg (ref 26.0–34.0)
MCHC: 32.7 g/dL (ref 30.0–36.0)
MCV: 91.5 fL (ref 80.0–100.0)
Monocytes Absolute: 1.7 10*3/uL — ABNORMAL HIGH (ref 0.1–1.0)
Monocytes Relative: 9 %
Neutro Abs: 12.3 10*3/uL — ABNORMAL HIGH (ref 1.7–7.7)
Neutrophils Relative %: 65 %
Platelets: 248 10*3/uL (ref 150–400)
RBC: 3.64 MIL/uL — ABNORMAL LOW (ref 3.87–5.11)
RDW: 14.1 % (ref 11.5–15.5)
Smear Review: NORMAL
WBC: 18.8 10*3/uL — ABNORMAL HIGH (ref 4.0–10.5)
nRBC: 0.1 % (ref 0.0–0.2)

## 2022-03-22 LAB — COMPREHENSIVE METABOLIC PANEL
ALT: 11 U/L (ref 0–44)
AST: 18 U/L (ref 15–41)
Albumin: 3.2 g/dL — ABNORMAL LOW (ref 3.5–5.0)
Alkaline Phosphatase: 159 U/L — ABNORMAL HIGH (ref 38–126)
Anion gap: 9 (ref 5–15)
BUN: 11 mg/dL (ref 8–23)
CO2: 28 mmol/L (ref 22–32)
Calcium: 8.4 mg/dL — ABNORMAL LOW (ref 8.9–10.3)
Chloride: 95 mmol/L — ABNORMAL LOW (ref 98–111)
Creatinine, Ser: 1.13 mg/dL — ABNORMAL HIGH (ref 0.44–1.00)
GFR, Estimated: 50 mL/min — ABNORMAL LOW (ref >60)
Glucose, Bld: 107 mg/dL — ABNORMAL HIGH (ref 70–99)
Potassium: 4.1 mmol/L (ref 3.5–5.1)
Sodium: 132 mmol/L — ABNORMAL LOW (ref 135–145)
Total Bilirubin: 0.5 mg/dL (ref 0.3–1.2)
Total Protein: 7.3 g/dL (ref 6.5–8.1)

## 2022-03-22 LAB — MAGNESIUM: Magnesium: 1.6 mg/dL — ABNORMAL LOW (ref 1.7–2.4)

## 2022-03-22 MED FILL — Dexamethasone Sodium Phosphate Inj 100 MG/10ML: INTRAMUSCULAR | Qty: 1 | Status: AC

## 2022-03-22 MED FILL — Fosaprepitant Dimeglumine For IV Infusion 150 MG (Base Eq): INTRAVENOUS | Qty: 5 | Status: AC

## 2022-03-22 NOTE — Progress Notes (Signed)
[facility] OFFICE PROGRESS NOTE  Patient Care Team: Baxter Hire, MD as PCP - General (Internal Medicine) Birder Robson, MD as Referring Physician (Ophthalmology) Isaias Cowman, MD as Consulting Physician (Cardiology) Ottie Glazier, MD as Consulting Physician (Pulmonary Disease) Cammie Sickle, MD as Consulting Physician (Oncology)   Cancer Staging  Cholangiocarcinoma of liver [facility]) Staging form: Intrahepatic Bile Duct, AJCC 8th Edition - Clinical: Stage IV (cT1a, cN1, cM1) - Signed by Cammie Sickle, MD on 02/21/2022 Stage prefix: Initial diagnosis    Oncology History Overview Note  IMPRESSION: Technically limited examination due to extensive respiratory motion artifact. Despite this, enhancing, infiltrative mass centered within the central left hepatic lobe amputate the biliary tree of the segments 2 and 3 and abuts the confluence of the hepatic ducts and is most in keeping with an infiltrative cholangiocarcinoma. This measures approximately 3.5 x 4.5 cm in greatest dimension. Extensive associated pathologic hilar and periportal lymphadenopathy results in subsequent obstruction of the mid common duct secondary to extrinsic compression.   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   Pathologic remote retroperitoneal adenopathy within the aortocaval and left periaortic lymph node groups.   Correlation with endoscopic ultrasound would be helpful for further evaluation     Electronically Signed   By: Fidela Salisbury M.D.   On: 01/28/2022 03:29  IMPRESSION: 1. Hypermetabolic left hepatic lobe cholangiocarcinoma with metastatic adenopathy in the chest and abdomen. 2. Hypermetabolic right adrenal nodule worrisome for a metastasis. 3. Small pericardial effusion, minimally increased from 01/29/2022. 4. Left adrenal adenoma.  No  follow-up necessary. 5. Punctate right renal stones. 6. Aortic atherosclerosis (ICD10-I70.0). Coronary artery calcification.     Electronically Signed   By: Lorin Picket M.D.   On: 02/15/2022 09:13  DIAGNOSIS:  A. PERIPANCREATIC MASS; EUS-ASSISTED FNA:  - POSITIVE FOR MALIGNANCY.  - ADENOCARCINOMA IS PRESENT.   There is insufficient material for ancillary molecular testing.   The specimen is adequate for interpretation.   # MAY 16th, 2023- Gem-Cis- Durvalumab.    Cholangiocarcinoma of liver ([place])  01/29/2022 Initial Diagnosis   Cholangiocarcinoma of liver ([place])    02/21/2022 Cancer Staging   Staging form: Intrahepatic Bile Duct, AJCC 8th Edition - Clinical: Stage IV (cT1a, cN1, cM1) - Signed by Cammie Sickle, MD on 02/21/2022 Stage prefix: Initial diagnosis    02/28/2022 -  Chemotherapy   Patient is on Treatment Plan : BLADDER Gemcitabine D1,8 + Cisplatin (split dose) D1,8 q21d x 4 cycles        INTERVAL HISTORY: Ambulating with a cane.  Accompanied by her daughter.  Marcia Livingston 76 y.o.  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  Patient s/p cycle number 1 day 15 approximately 2 weeks ago.  Complains of joint pain back pain from growth factor postchemotherapy.  Denies any significant nausea vomiting.  Patient taking antiemetics as needed.  Complains of mild fatigue.  Otherwise no fever no chills.  Complains of mild cough.  No shortness of breath.  No fevers.  Denies any worsening back pain.  Denies any tingling or numbness in the extremities.  Review of Systems  Constitutional:  Positive for malaise/fatigue and weight loss. Negative for chills, diaphoresis and fever.  HENT:  Negative for nosebleeds and sore throat.   Eyes:  Negative for double vision.  Respiratory:  Negative for cough, hemoptysis, sputum production, shortness of  breath and wheezing.   Cardiovascular:  Negative for chest pain, palpitations, orthopnea and leg swelling.  Gastrointestinal:  Negative for abdominal pain, blood in stool, constipation, diarrhea, heartburn, melena, nausea and vomiting.  Genitourinary:  Negative for dysuria, frequency and urgency.  Musculoskeletal:  Positive for back pain and joint pain.  Skin: Negative.  Negative for itching and rash.  Neurological:  Negative for dizziness, tingling, focal weakness, weakness and headaches.  Endo/Heme/Allergies:  Does not bruise/bleed easily.  Psychiatric/Behavioral:  Negative for depression. The patient is not nervous/anxious and does not have insomnia.      PAST MEDICAL HISTORY :  Past Medical History:  Diagnosis Date  . Anxiety   . Arthritis   . CAD (coronary artery disease)   . Cancer ([place])    SKIN  . Colon polyps   . COPD (chronic obstructive pulmonary disease) ([place])   . Depression   . Depression   . Fibrocystic breast disease   . GERD (gastroesophageal reflux disease)   . Heart murmur   . High cholesterol   . Hyperlipidemia   . Hypertension   . IBS (irritable bowel syndrome)   . IBS (irritable bowel syndrome)   . Neuromuscular disorder ([place])   . Obesity   . Osteoporosis   . Overactive bladder   . Sleep apnea   . Sleep apnea     PAST SURGICAL HISTORY :   Past Surgical History:  Procedure Laterality Date  . ABDOMINAL HYSTERECTOMY     total  . APPENDECTOMY    . BREAST EXCISIONAL BIOPSY Right YRS AGO   NEG  . CATARACT EXTRACTION W/PHACO Right 02/01/2016   Procedure: CATARACT EXTRACTION PHACO AND INTRAOCULAR LENS PLACEMENT (IOC);  Surgeon: Birder Robson, MD;  Location: [facility] ORS;  Service: Ophthalmology;  Laterality: Right;  [place]    00:37.2 AP%  20.7% CDE  7.69 fluid pack lot # [id number]  . CATARACT EXTRACTION W/PHACO Left 02/22/2016   Procedure: CATARACT EXTRACTION PHACO AND INTRAOCULAR LENS PLACEMENT (IOC);  Surgeon: Birder Robson, MD;  Location: [facility] ORS;   Service: Ophthalmology;  Laterality: Left;  [place] 00:40 AP% 19.9 CDE 8.06 fluid pack lot # [id number]  . CHOLECYSTECTOMY    . COLONOSCOPY WITH PROPOFOL N/A 01/13/2016   Procedure: COLONOSCOPY WITH PROPOFOL;  Surgeon: Manya Silvas, MD;  Location: [facility] ENDOSCOPY;  Service: Endoscopy;  Laterality: N/A;  . COLONOSCOPY WITH PROPOFOL N/A 12/14/2021   Procedure: COLONOSCOPY WITH PROPOFOL;  Surgeon: Toledo, Benay Pike, MD;  Location: [facility] ENDOSCOPY;  Service: Gastroenterology;  Laterality: N/A;  . ERCP N/A 01/31/2022   Procedure: ENDOSCOPIC RETROGRADE CHOLANGIOPANCREATOGRAPHY (ERCP);  Surgeon: Lucilla Lame, MD;  Location: [facility] ENDOSCOPY;  Service: Endoscopy;  Laterality: N/A;  . EUS N/A 02/16/2022   Procedure: UPPER ENDOSCOPIC ULTRASOUND (EUS) LINEAR;  Surgeon: Holly Bodily, MD;  Location: [facility] ENDOSCOPY;  Service: Gastroenterology;  Laterality: N/A;  [facility]  . EYE SURGERY    . FOOT SURGERY Left    for breakdown of arch  . OVARIAN CYST SURGERY    . PORTACATH PLACEMENT Left 02/24/2022   Procedure: INSERTION PORT-A-CATH;  Surgeon: Robert Bellow, MD;  Location: [facility] ORS;  Service: General;  Laterality: Left;  . SKIN CANCER EXCISION Right 05/16/2017   Right Shin    FAMILY HISTORY :   Family History  Problem Relation Age of Onset  . Colon cancer Mother        thought to be mets from breast cancer  . Stroke  Mother   . Hypertension Mother   . Heart attack Mother   . Breast cancer Mother 47  . Emphysema Father   . Prostate cancer Father   . Breast cancer Maternal Aunt        70's  . Throat cancer Daughter   . Rheum arthritis Daughter   . Hypertension Daughter   . Reye's syndrome Daughter        history of  . Hepatitis C Daughter   . Ovarian cancer Neg Hx     SOCIAL HISTORY:   Social History   Tobacco Use  . Smoking status: Former    Packs/day: 1.00    Years: 30.00    Pack years: 30.00    Types: Cigarettes    Quit date: 10/15/1996    Years since quitting: 25.4  .  Smokeless tobacco: Never  Vaping Use  . Vaping Use: Never used  Substance Use Topics  . Alcohol use: Yes    Comment: wine rarely  . Drug use: No    ALLERGIES:  is allergic to penicillins, hydromorphone, meperidine hcl, codeine, and sulfa antibiotics.  MEDICATIONS:  Current Outpatient Medications  Medication Sig Dispense Refill  . aspirin EC 81 MG tablet Take 81 mg by mouth daily.    Marland Kitchen buPROPion (WELLBUTRIN XL) 150 MG 24 hr tablet TAKE 1 TABLET BY MOUTH  DAILY 90 tablet 1  . busPIRone (BUSPAR) 15 MG tablet Take 15 mg by mouth 3 (three) times daily.    . calcium carbonate (TUMS - DOSED IN MG ELEMENTAL CALCIUM) 500 MG chewable tablet Chew 1 tablet by mouth 3 (three) times daily with meals.    . cholecalciferol (VITAMIN D) 1000 units tablet Take 1,000 Units by mouth daily.    Marland Kitchen dronabinol (MARINOL) 2.5 MG capsule Take 1 capsule (2.5 mg total) by mouth 2 (two) times daily before a meal. 60 capsule 0  . DULoxetine (CYMBALTA) 60 MG capsule TAKE 1 CAPSULE BY MOUTH  DAILY 90 capsule 1  . gabapentin (NEURONTIN) 300 MG capsule Take 300 mg by mouth 3 (three) times daily.    . hydrochlorothiazide (HYDRODIURIL) 25 MG tablet TAKE 1 TABLET BY MOUTH  DAILY 90 tablet 3  . lidocaine-prilocaine (EMLA) cream Apply on the port. 30 -45 min  prior to port access. 30 g 3  . losartan (COZAAR) 50 MG tablet TAKE 1 TABLET BY MOUTH  DAILY 90 tablet 3  . ondansetron (ZOFRAN) 4 MG tablet Take 1 tablet (4 mg total) by mouth every 6 (six) hours as needed for nausea. 20 tablet 0  . ondansetron (ZOFRAN) 8 MG tablet One pill every 8 hours as needed for nausea/vomitting. 40 tablet 1  . potassium chloride SA (KLOR-CON M) 20 MEQ tablet 1 pill twice a day 60 tablet 1  . prochlorperazine (COMPAZINE) 10 MG tablet Take 1 tablet (10 mg total) by mouth every 6 (six) hours as needed for nausea or vomiting. 40 tablet 1  . rosuvastatin (CRESTOR) 5 MG tablet Take 5 mg by mouth 3 (three) times a week.    . traMADol (ULTRAM) 50 MG tablet  Take 1 tablet (50 mg total) by mouth every 12 (twelve) hours as needed. 30 tablet 0  . traZODone (DESYREL) 50 MG tablet Take 1-2 tablets at bedtime for sleep. 180 tablet 1  . vitamin B-12 (CYANOCOBALAMIN) 500 MCG tablet Take 500 mcg by mouth daily.    Marland Kitchen ALPRAZolam (XANAX) 0.5 MG tablet Take 1 tablet (0.5 mg total) by mouth 2 (two) times daily  as needed for anxiety. (Patient not taking: Reported on 03/22/2022) 15 tablet 0  . colestipol (COLESTID) 1 g tablet TAKE 1 TABLET BY MOUTH  DAILY (Patient not taking: Reported on 03/22/2022) 90 tablet 0  . Fluticasone-Umeclidin-Vilant (TRELEGY ELLIPTA) 100-62.5-25 MCG/INH AEPB Inhale 1 puff into the lungs daily.  (Patient not taking: Reported on 03/22/2022)     No current facility-administered medications for this visit.    PHYSICAL EXAMINATION: ECOG PERFORMANCE STATUS: 1 - Symptomatic but completely ambulatory  BP (!) 137/58 (BP Location: Left Arm, Patient Position: Sitting)   Pulse 80   Temp 98 F (36.7 C) (Tympanic)   Ht '5\' 1"'  (1.549 m)   Wt 189 lb 6.4 oz (85.9 kg)   SpO2 98%   BMI 35.79 kg/m   Filed Weights   03/22/22 1526  Weight: 189 lb 6.4 oz (85.9 kg)     Physical Exam Vitals and nursing note reviewed.  HENT:     Head: Normocephalic and atraumatic.     Mouth/Throat:     Pharynx: Oropharynx is clear.  Eyes:     Extraocular Movements: Extraocular movements intact.     Pupils: Pupils are equal, round, and reactive to light.  Cardiovascular:     Rate and Rhythm: Normal rate and regular rhythm.  Pulmonary:     Comments: Decreased breath sounds bilaterally.  Abdominal:     Palpations: Abdomen is soft.  Musculoskeletal:        General: Normal range of motion.     Cervical back: Normal range of motion.  Skin:    General: Skin is warm.  Neurological:     General: No focal deficit present.     Mental Status: She is alert and oriented to person, place, and time.  Psychiatric:        Behavior: Behavior normal.        Judgment:  Judgment normal.      LABORATORY DATA:  I have reviewed the data as listed    Component Value Date/Time   NA 132 (L) 03/22/2022 1451   NA 141 01/30/2020 1044   K 4.1 03/22/2022 1451   CL 95 (L) 03/22/2022 1451   CO2 28 03/22/2022 1451   GLUCOSE 107 (H) 03/22/2022 1451   BUN 11 03/22/2022 1451   BUN 9 01/30/2020 1044   CREATININE 1.13 (H) 03/22/2022 1451   CREATININE 0.86 02/27/2012 0912   CALCIUM 8.4 (L) 03/22/2022 1451   PROT 7.3 03/22/2022 1451   PROT 7.0 01/30/2020 1044   ALBUMIN 3.2 (L) 03/22/2022 1451   ALBUMIN 4.3 01/30/2020 1044   AST 18 03/22/2022 1451   ALT 11 03/22/2022 1451   ALKPHOS 159 (H) 03/22/2022 1451   BILITOT 0.5 03/22/2022 1451   BILITOT 0.4 01/30/2020 1044   GFRNONAA 50 (L) 03/22/2022 1451   GFRNONAA >60 02/27/2012 0912   GFRAA 74 01/30/2020 1044   GFRAA >60 02/27/2012 0912    No results found for: SPEP, UPEP  Lab Results  Component Value Date   WBC 18.8 (H) 03/22/2022   NEUTROABS 12.3 (H) 03/22/2022   HGB 10.9 (L) 03/22/2022   HCT 33.3 (L) 03/22/2022   MCV 91.5 03/22/2022   PLT 248 03/22/2022      Chemistry      Component Value Date/Time   NA 132 (L) 03/22/2022 1451   NA 141 01/30/2020 1044   K 4.1 03/22/2022 1451   CL 95 (L) 03/22/2022 1451   CO2 28 03/22/2022 1451   BUN 11 03/22/2022 1451  BUN 9 01/30/2020 1044   CREATININE 1.13 (H) 03/22/2022 1451   CREATININE 0.86 02/27/2012 0912      Component Value Date/Time   CALCIUM 8.4 (L) 03/22/2022 1451   ALKPHOS 159 (H) 03/22/2022 1451   AST 18 03/22/2022 1451   ALT 11 03/22/2022 1451   BILITOT 0.5 03/22/2022 1451   BILITOT 0.4 01/30/2020 1044       RADIOGRAPHIC STUDIES: I have personally reviewed the radiological images as listed and agreed with the findings in the report. No results found.   ASSESSMENT & PLAN:  Cholangiocarcinoma of liver ([place]) #Cholangiocarcinoma stage IV-insufficient tissue for NGS; possibly MMR testing.  Liquid biopsy guardant testing; Currently on   gemcitabine [827m/m2 day 1-8] cisplatin [ 30 mg per metered square day 1 day 8] with durvalumab every 21 days.   #Proceed with cycle #2; day 1 cis- gemcitabine-durva  tomorrow. Labs today reviewed;  acceptable for treatment today. GFR- 50; monitor labs closely on cisplatin.  # Nausea- G-1- on Zofran prn every other day.   # Myalgias/back pain- sec to Udenyca s/p Claritin x7 days [not helped]; okay to use tramadol as needed.  # Electrolyte abnoralities: Hypocalcemia: Calcium 8.5-add calcium vitamin D: Hypomagnesemia-mag 1.6; continue 2 g of mag premedications; hypokalemia-potassium 4.1 continue.  #Obstructive jaundice status post ERCP status post stenting; LFTs/bilirubin improved.  Monitor closely. STABLE.   #Transition of care: [place]  As per the family patient has an appointment with oncologist in [place]-cycle #2 week #3.   # IV access: s/p MediPort- functional.    Cycle- 1; gem-800/cis-30; durva  # DISPOSITION: # chemo as planned tomorrow- gem-cis-durvalumab.  # 1 week- MD: labs- cbc/cmp;Ca-19-9; magnesium;chemo- gem-cis; 2gm of Magnesium;  D-2 udenyca   Orders Placed This Encounter  Procedures  . Cancer antigen 19-9    Standing Status:   Future    Standing Expiration Date:   03/23/2023  . Magnesium    Standing Status:   Future    Standing Expiration Date:   03/23/2023   All questions were answered. The patient knows to call the clinic with any problems, questions or concerns.      GCammie Sickle MD 03/22/2022 4:19 PM

## 2022-03-22 NOTE — Assessment & Plan Note (Addendum)
#  Cholangiocarcinoma stage IV-insufficient tissue for NGS; possibly MMR testing.  Liquid biopsy guardant testing; Currently on  gemcitabine [876m/m2 day 1-8] cisplatin [ 30 mg per metered square day 1 day 8] with durvalumab every 21 days.   #Proceed with cycle #2; day 1 cis- gemcitabine-durva  tomorrow. Labs today reviewed;  acceptable for treatment today. GFR- 50; monitor labs closely on cisplatin.  # Nausea- G-1- on Zofran prn every other day.   # Myalgias/back pain- sec to Udenyca s/p Claritin x7 days [not helped]; okay to use tramadol as needed.  # Electrolyte abnoralities: Hypocalcemia: Calcium 8.5-add calcium vitamin D: Hypomagnesemia-mag 1.6; continue 2 g of mag premedications; hypokalemia-potassium 4.1 continue.  #Obstructive jaundice status post ERCP status post stenting; LFTs/bilirubin improved.  Monitor closely. STABLE.   #Transition of care: [place]  As per the family patient has an appointment with oncologist in [place]-cycle #2 week #3.   # IV access: s/p MediPort- functional.    Cycle- 1; gem-800/cis-30; durva  # DISPOSITION: # chemo as planned tomorrow- gem-cis-durvalumab.  # 1 week- MD: labs- cbc/cmp;Ca-19-9; magnesium;chemo- gem-cis; 2gm of Magnesium;  D-2 uEllen Henri

## 2022-03-22 NOTE — Progress Notes (Signed)
Pt states she is doing pretty well right now. No complaints today.

## 2022-03-23 ENCOUNTER — Inpatient Hospital Stay: Payer: Medicare Other

## 2022-03-23 ENCOUNTER — Other Ambulatory Visit: Payer: Self-pay | Admitting: Internal Medicine

## 2022-03-23 VITALS — BP 136/64 | HR 82 | Temp 97.0°F | Resp 17 | Wt 189.0 lb

## 2022-03-23 DIAGNOSIS — C221 Intrahepatic bile duct carcinoma: Secondary | ICD-10-CM

## 2022-03-23 DIAGNOSIS — Z5112 Encounter for antineoplastic immunotherapy: Secondary | ICD-10-CM | POA: Diagnosis not present

## 2022-03-23 LAB — CANCER ANTIGEN 19-9: CA 19-9: 3446 U/mL — ABNORMAL HIGH (ref 0–35)

## 2022-03-23 MED ORDER — POTASSIUM CHLORIDE IN NACL 20-0.9 MEQ/L-% IV SOLN
Freq: Once | INTRAVENOUS | Status: AC
  Filled 2022-03-23: qty 1000

## 2022-03-23 MED ORDER — SODIUM CHLORIDE 0.9 % IV SOLN
150.0000 mg | Freq: Once | INTRAVENOUS | Status: AC
  Administered 2022-03-23: 150 mg via INTRAVENOUS
  Filled 2022-03-23: qty 150

## 2022-03-23 MED ORDER — SODIUM CHLORIDE 0.9% FLUSH
10.0000 mL | INTRAVENOUS | Status: DC | PRN
  Administered 2022-03-23: 10 mL
  Filled 2022-03-23: qty 10

## 2022-03-23 MED ORDER — MAGNESIUM SULFATE 2 GM/50ML IV SOLN
2.0000 g | Freq: Once | INTRAVENOUS | Status: AC
  Administered 2022-03-23: 2 g via INTRAVENOUS
  Filled 2022-03-23: qty 50

## 2022-03-23 MED ORDER — SODIUM CHLORIDE 0.9 % IV SOLN: 35.0000 mg/m2 | Freq: Once | INTRAVENOUS | Status: DC

## 2022-03-23 MED ORDER — PALONOSETRON HCL INJECTION 0.25 MG/5ML
0.2500 mg | Freq: Once | INTRAVENOUS | Status: AC
  Administered 2022-03-23: 0.25 mg via INTRAVENOUS
  Filled 2022-03-23: qty 5

## 2022-03-23 MED ORDER — SODIUM CHLORIDE 0.9 % IV SOLN
1500.0000 mg | Freq: Once | INTRAVENOUS | Status: AC
  Administered 2022-03-23: 1500 mg via INTRAVENOUS
  Filled 2022-03-23: qty 30

## 2022-03-23 MED ORDER — SODIUM CHLORIDE 0.9 % IV SOLN
Freq: Once | INTRAVENOUS | Status: AC
  Filled 2022-03-23: qty 250

## 2022-03-23 MED ORDER — HEPARIN SOD (PORK) LOCK FLUSH 100 UNIT/ML IV SOLN
500.0000 [IU] | Freq: Once | INTRAVENOUS | Status: AC | PRN
  Administered 2022-03-23: 500 [IU]
  Filled 2022-03-23: qty 5

## 2022-03-23 MED ORDER — SODIUM CHLORIDE 0.9 % IV SOLN
1000.0000 mg/m2 | Freq: Once | INTRAVENOUS | Status: AC
  Administered 2022-03-23: 1938 mg via INTRAVENOUS
  Filled 2022-03-23: qty 50.97

## 2022-03-23 MED ORDER — SODIUM CHLORIDE 0.9 % IV SOLN
25.0000 mg/m2 | Freq: Once | INTRAVENOUS | Status: AC
  Administered 2022-03-23: 49 mg via INTRAVENOUS
  Filled 2022-03-23: qty 49

## 2022-03-23 MED ORDER — SODIUM CHLORIDE 0.9 % IV SOLN
10.0000 mg | Freq: Once | INTRAVENOUS | Status: AC
  Administered 2022-03-23: 10 mg via INTRAVENOUS
  Filled 2022-03-23: qty 10

## 2022-03-23 NOTE — Progress Notes (Signed)
Nutrition Follow-up:  Referral placed by NP for poor appetite and weight loss.   Patient with stage IV intrahepatic cholangiocarcinoma.  Receiving chemotherapy.    Met with patient during infusion.  Patient reports that appetite is better after starting marinol (5/31).  Says that she is taking nausea medication mostly every other day.  Breakfast is usually egg, breakfast meat, gravy, toast.  Dinner is meat and vegetables (last night roast with vegetables and cornbread and applesauce).  Snacks some during the day (mixed nuts, cheese-its, yogurt).    Patient says that she is planning to move to [facility] on 6/20 and will continue care in [place].    Medications: reviewed  Labs: reviewed  Anthropometrics:   Weight 189 lb   193 lb 5/16 201 lb on 4/18 205 lb on 3/1  8% weight loss in the last 3 months, significant   NUTRITION DIAGNOSIS: Inadequate oral intake related to cancer related treatment side effects as evidenced by     INTERVENTION:  Encouraged high calorie, high protein foods/snacks as tolerated Encouraged taking nausea medication to help relieve symptom. Contact information provided to give to grand-daughter to call RD with questions.     NEXT VISIT: no follow-up with moving to [facility] RD available as needed  Evee Liska B. Zenia Resides, [place], [place] Registered Dietitian [phone]

## 2022-03-23 NOTE — Progress Notes (Signed)
Reduce cisplatin to '25mg'$ /m2 for rest of cycles.  Keep on Gemzar '1000mg'$ /m2 - per MD

## 2022-03-23 NOTE — Patient Instructions (Addendum)
[facility] CANCER CTR AT [place]  Discharge Instructions: Thank you for choosing [facility] to provide your oncology and hematology care.  If you have a lab appointment with the [facility], please go directly to the [facility] and check in at the registration area.  Wear comfortable clothing and clothing appropriate for easy access to any Portacath or PICC line.   We strive to give you quality time with your provider. You may need to reschedule your appointment if you arrive late (15 or more minutes).  Arriving late affects you and other patients whose appointments are after yours.  Also, if you miss three or more appointments without notifying the office, you may be dismissed from the clinic at the provider's discretion.      For prescription refill requests, have your pharmacy contact our office and allow 72 hours for refills to be completed.    Today you received the following chemotherapy and/or immunotherapy agents: potassium, Magnesium, CISplatin, gemcitabine, & durvalumab   To help prevent nausea and vomiting after your treatment, we encourage you to take your nausea medication as directed.  BELOW ARE SYMPTOMS THAT SHOULD BE REPORTED IMMEDIATELY: *FEVER GREATER THAN 100.4 F (38 C) OR HIGHER *CHILLS OR SWEATING *NAUSEA AND VOMITING THAT IS NOT CONTROLLED WITH YOUR NAUSEA MEDICATION *UNUSUAL SHORTNESS OF BREATH *UNUSUAL BRUISING OR BLEEDING *URINARY PROBLEMS (pain or burning when urinating, or frequent urination) *BOWEL PROBLEMS (unusual diarrhea, constipation, pain near the anus) TENDERNESS IN MOUTH AND THROAT WITH OR WITHOUT PRESENCE OF ULCERS (sore throat, sores in mouth, or a toothache) UNUSUAL RASH, SWELLING OR PAIN  UNUSUAL VAGINAL DISCHARGE OR ITCHING   Items with * indicate a potential emergency and should be followed up as soon as possible or go to the Emergency Department if any problems should occur.  Please show the CHEMOTHERAPY ALERT  CARD or IMMUNOTHERAPY ALERT CARD at check-in to the Emergency Department and triage nurse.  Should you have questions after your visit or need to cancel or reschedule your appointment, please contact [facility] [place] AT [place]  [phone] and follow the prompts.  Office hours are 8:00 a.m. to 4:30 p.m. Monday - Friday. Please note that voicemails left after 4:00 p.m. may not be returned until the following business day.  We are closed weekends and major holidays. You have access to a nurse at all times for urgent questions. Please call the main number to the clinic [phone] and follow the prompts.  For any non-urgent questions, you may also contact your provider using MyChart. We now offer e-Visits for anyone 77 and older to request care online for non-urgent symptoms. For details visit [email or web address].   Also download the MyChart app! Go to the app store, search "MyChart", open the app, select [facility], and log in with your MyChart username and password.  Due to Covid, a mask is required upon entering the hospital/clinic. If you do not have a mask, one will be given to you upon arrival. For doctor visits, patients may have 1 support person aged 77 or older with them. For treatment visits, patients cannot have anyone with them due to current Covid guidelines and our immunocompromised population.

## 2022-03-28 ENCOUNTER — Inpatient Hospital Stay: Payer: Medicare Other

## 2022-03-28 ENCOUNTER — Other Ambulatory Visit: Payer: Self-pay | Admitting: Internal Medicine

## 2022-03-28 ENCOUNTER — Other Ambulatory Visit: Payer: Self-pay

## 2022-03-28 ENCOUNTER — Encounter: Payer: Self-pay | Admitting: Internal Medicine

## 2022-03-28 ENCOUNTER — Inpatient Hospital Stay (HOSPITAL_BASED_OUTPATIENT_CLINIC_OR_DEPARTMENT_OTHER): Payer: Medicare Other | Admitting: Internal Medicine

## 2022-03-28 DIAGNOSIS — R5383 Other fatigue: Secondary | ICD-10-CM

## 2022-03-28 DIAGNOSIS — Z5112 Encounter for antineoplastic immunotherapy: Secondary | ICD-10-CM | POA: Diagnosis not present

## 2022-03-28 DIAGNOSIS — C221 Intrahepatic bile duct carcinoma: Secondary | ICD-10-CM | POA: Diagnosis not present

## 2022-03-28 LAB — CBC WITH DIFFERENTIAL/PLATELET
Abs Immature Granulocytes: 0.06 10*3/uL (ref 0.00–0.07)
Basophils Absolute: 0.1 10*3/uL (ref 0.0–0.1)
Basophils Relative: 1 %
Eosinophils Absolute: 0 10*3/uL (ref 0.0–0.5)
Eosinophils Relative: 0 %
HCT: 33 % — ABNORMAL LOW (ref 36.0–46.0)
Hemoglobin: 10.7 g/dL — ABNORMAL LOW (ref 12.0–15.0)
Immature Granulocytes: 1 %
Lymphocytes Relative: 22 %
Lymphs Abs: 1.7 10*3/uL (ref 0.7–4.0)
MCH: 29.6 pg (ref 26.0–34.0)
MCHC: 32.4 g/dL (ref 30.0–36.0)
MCV: 91.4 fL (ref 80.0–100.0)
Monocytes Absolute: 0.1 10*3/uL (ref 0.1–1.0)
Monocytes Relative: 1 %
Neutro Abs: 5.9 10*3/uL (ref 1.7–7.7)
Neutrophils Relative %: 75 %
Platelets: 308 10*3/uL (ref 150–400)
RBC: 3.61 MIL/uL — ABNORMAL LOW (ref 3.87–5.11)
RDW: 14 % (ref 11.5–15.5)
WBC: 7.9 10*3/uL (ref 4.0–10.5)
nRBC: 0 % (ref 0.0–0.2)

## 2022-03-28 LAB — COMPREHENSIVE METABOLIC PANEL
ALT: 21 U/L (ref 0–44)
AST: 23 U/L (ref 15–41)
Albumin: 3.4 g/dL — ABNORMAL LOW (ref 3.5–5.0)
Alkaline Phosphatase: 143 U/L — ABNORMAL HIGH (ref 38–126)
Anion gap: 12 (ref 5–15)
BUN: 17 mg/dL (ref 8–23)
CO2: 26 mmol/L (ref 22–32)
Calcium: 9 mg/dL (ref 8.9–10.3)
Chloride: 95 mmol/L — ABNORMAL LOW (ref 98–111)
Creatinine, Ser: 1.08 mg/dL — ABNORMAL HIGH (ref 0.44–1.00)
GFR, Estimated: 53 mL/min — ABNORMAL LOW (ref >60)
Glucose, Bld: 132 mg/dL — ABNORMAL HIGH (ref 70–99)
Potassium: 4.2 mmol/L (ref 3.5–5.1)
Sodium: 133 mmol/L — ABNORMAL LOW (ref 135–145)
Total Bilirubin: 0.5 mg/dL (ref 0.3–1.2)
Total Protein: 7.7 g/dL (ref 6.5–8.1)

## 2022-03-28 LAB — MAGNESIUM: Magnesium: 1.3 mg/dL — ABNORMAL LOW (ref 1.7–2.4)

## 2022-03-28 NOTE — Progress Notes (Unsigned)
C/o weakness, nausea, and feet swelling.

## 2022-03-28 NOTE — Assessment & Plan Note (Addendum)
#  Cholangiocarcinoma stage IV-insufficient tissue for NGS; possibly MMR testing.  Liquid biopsy guardant testing; Currently on  gemcitabine [$RemoveBefor'800mg'wVUDnlcHupzY$ /m2 day 1-8] cisplatin [ 30 mg per metered square day 1 day 8] with durvalumab every 21 days.   #Proceed with cycle #2; day 1 cis- gemcitabine-durva  tomorrow. Labs today reviewed;  acceptable for treatment today. GFR- 50; monitor labs closely on cisplatin.  # Nausea- G-1- on Zofran prn every other day.   # Myalgias/back pain- sec to Udenyca s/p Claritin x7 days [not helped]; okay to use tramadol as needed.  # Electrolyte abnoralities: Hypocalcemia: Calcium 8.5-add calcium vitamin D: Hypomagnesemia-mag 1.6; continue 2 g of mag premedications; hypokalemia-potassium 4.1 continue.  #Obstructive jaundice status post ERCP status post stenting; LFTs/bilirubin improved.  Monitor closely. STABLE.   #Transition of care: Patient plan to move to [place] in middle of June.  As per the family patient has an appointment with oncologist in [place]-cycle #2 week #3.   # IV access: s/p MediPort- functional.    Cycle- 1; gem-800/cis-30; durva  # DISPOSITION: # on 6/15- Gem- ONLY; NO cisplatin; 2 gm of mag; CANCEL-  6/16 udenyca- Dr.B

## 2022-03-28 NOTE — Progress Notes (Unsigned)
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 Despite this, enhancing, infiltrative mass centered within the central left hepatic lobe amputate the biliary tree of the segments 2 and 3 and abuts the confluence of the hepatic ducts and is most in keeping with an infiltrative cholangiocarcinoma. This measures approximately 3.5 x 4.5 cm in greatest dimension. Extensive associated pathologic hilar and periportal lymphadenopathy results in subsequent obstruction of the mid common duct secondary to extrinsic compression.   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   Pathologic remote retroperitoneal adenopathy within the aortocaval and left periaortic lymph node groups.   Correlation with endoscopic ultrasound would be helpful for further evaluation     Electronically Signed   By: Fidela Salisbury M.D.   On: 01/28/2022 03:29  IMPRESSION: 1. Hypermetabolic left hepatic lobe cholangiocarcinoma with metastatic adenopathy in the chest and abdomen. 2. Hypermetabolic right adrenal nodule worrisome for a metastasis. 3. Small pericardial effusion, minimally increased from 01/29/2022. 4. Left adrenal adenoma.  No  follow-up necessary. 5. Punctate right renal stones. 6. Aortic atherosclerosis (ICD10-I70.0). Coronary artery calcification.     Electronically Signed   By: Lorin Picket M.D.   On: 02/15/2022 09:13  DIAGNOSIS:  A. PERIPANCREATIC MASS; EUS-ASSISTED FNA:  - POSITIVE FOR MALIGNANCY.  - ADENOCARCINOMA IS PRESENT.   There is insufficient material for ancillary molecular testing.   The specimen is adequate for interpretation.   # MAY 16th, 2023- Gem-Cis- Durvalumab.    Cholangiocarcinoma of liver ([place])  01/29/2022 Initial Diagnosis   Cholangiocarcinoma of liver ([place])   02/21/2022 Cancer Staging   Staging form: Intrahepatic Bile Duct, AJCC 8th Edition - Clinical: Stage IV (cT1a, cN1, cM1) - Signed by Cammie Sickle, MD on 02/21/2022 Stage prefix: Initial diagnosis   02/28/2022 -  Chemotherapy   Patient is on Treatment Plan : BLADDER Gemcitabine D1,8 + Cisplatin (split dose) D1,8 q21d x 4 cycles       INTERVAL HISTORY: Ambulating with a cane.  Accompanied by her daughter.  Marcia Livingston 76 y.o.  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  Patient s/p cycle number 2 day #1  approximately 1 weeks ago.    Positive for nausea/ vomiting. Feeling poorly.   Denies any significant nausea vomiting.  Patient taking antiemetics as needed.  Complains of mild fatigue.  Otherwise no fever no chills.  Complains of mild cough.  No shortness of breath.  No fevers.  Denies any worsening back pain.  Denies any tingling or numbness in the extremities.  Review of Systems  Constitutional:  Positive for malaise/fatigue and weight loss. Negative for chills, diaphoresis and fever.  HENT:  Negative for nosebleeds and sore throat.   Eyes:  Negative for double vision.  Respiratory:  Negative for  cough, hemoptysis, sputum production, shortness of breath and wheezing.    Cardiovascular:  Negative for chest pain, palpitations, orthopnea and leg swelling.  Gastrointestinal:  Negative for abdominal pain, blood in stool, constipation, diarrhea, heartburn, melena, nausea and vomiting.  Genitourinary:  Negative for dysuria, frequency and urgency.  Musculoskeletal:  Positive for back pain and joint pain.  Skin: Negative.  Negative for itching and rash.  Neurological:  Negative for dizziness, tingling, focal weakness, weakness and headaches.  Endo/Heme/Allergies:  Does not bruise/bleed easily.  Psychiatric/Behavioral:  Negative for depression. The patient is not nervous/anxious and does not have insomnia.       PAST MEDICAL HISTORY :  Past Medical History:  Diagnosis Date   Anxiety    Arthritis    CAD (coronary artery disease)    Cancer (HCC)    SKIN   Colon polyps    COPD (chronic obstructive pulmonary disease) (HCC)    Depression    Depression    Fibrocystic breast disease    GERD (gastroesophageal reflux disease)    Heart murmur    High cholesterol    Hyperlipidemia    Hypertension    IBS (irritable bowel syndrome)    IBS (irritable bowel syndrome)    Neuromuscular disorder (HCC)    Obesity    Osteoporosis    Overactive bladder    Sleep apnea    Sleep apnea     PAST SURGICAL HISTORY :   Past Surgical History:  Procedure Laterality Date   ABDOMINAL HYSTERECTOMY     total   APPENDECTOMY     BREAST EXCISIONAL BIOPSY Right YRS AGO   NEG   CATARACT EXTRACTION W/PHACO Right 02/01/2016   Procedure: CATARACT EXTRACTION PHACO AND INTRAOCULAR LENS PLACEMENT ([place]);  Surgeon: Birder Robson, MD;  Location: [facility] ORS;  Service: Ophthalmology;  Laterality: Right;  [place]    00:37.2 AP%  20.7% CDE  7.69 fluid pack lot # [id number]   CATARACT EXTRACTION W/PHACO Left 02/22/2016   Procedure: CATARACT EXTRACTION PHACO AND INTRAOCULAR LENS PLACEMENT (IOC);  Surgeon: Birder Robson, MD;  Location: [facility] ORS;  Service: Ophthalmology;  Laterality: Left;  [place]  00:40 AP% 19.9 CDE 8.06 fluid pack lot # [id number]   CHOLECYSTECTOMY     COLONOSCOPY WITH PROPOFOL N/A 01/13/2016   Procedure: COLONOSCOPY WITH PROPOFOL;  Surgeon: Manya Silvas, MD;  Location: [facility] ENDOSCOPY;  Service: Endoscopy;  Laterality: N/A;   COLONOSCOPY WITH PROPOFOL N/A 12/14/2021   Procedure: COLONOSCOPY WITH PROPOFOL;  Surgeon: Toledo, Benay Pike, MD;  Location: [facility] ENDOSCOPY;  Service: Gastroenterology;  Laterality: N/A;   ERCP N/A 01/31/2022   Procedure: ENDOSCOPIC RETROGRADE CHOLANGIOPANCREATOGRAPHY (ERCP);  Surgeon: Lucilla Lame, MD;  Location: [facility] ENDOSCOPY;  Service: Endoscopy;  Laterality: N/A;   EUS N/A 02/16/2022   Procedure: UPPER ENDOSCOPIC ULTRASOUND (EUS) LINEAR;  Surgeon: Holly Bodily, MD;  Location: [facility] ENDOSCOPY;  Service: Gastroenterology;  Laterality: N/A;  [facility]   EYE SURGERY     FOOT SURGERY Left    for breakdown of arch   OVARIAN CYST SURGERY     PORTACATH PLACEMENT Left 02/24/2022   Procedure: INSERTION PORT-A-CATH;  Surgeon: Robert Bellow, MD;  Location: [facility] ORS;  Service: General;  Laterality: Left;   SKIN CANCER EXCISION Right 05/16/2017   Right Shin    FAMILY HISTORY :   Family History  Problem Relation Age of Onset   Colon cancer Mother        thought to be mets from breast cancer   Stroke Mother  Hypertension Mother    Heart attack Mother    Breast cancer Mother 51   Emphysema Father    Prostate cancer Father    Breast cancer Maternal Aunt        52's   Throat cancer Daughter    Rheum arthritis Daughter    Hypertension Daughter    Reye's syndrome Daughter        history of   Hepatitis C Daughter    Ovarian cancer Neg Hx     SOCIAL HISTORY:   Social History   Tobacco Use   Smoking status: Former    Packs/day: 1.00    Years: 30.00    Total pack years: 30.00    Types: Cigarettes    Quit date: 10/15/1996    Years since quitting: 25.4   Smokeless tobacco: Never  Vaping Use   Vaping Use: Never used   Substance Use Topics   Alcohol use: Yes    Comment: wine rarely   Drug use: No    ALLERGIES:  is allergic to penicillins, hydromorphone, meperidine hcl, codeine, and sulfa antibiotics.  MEDICATIONS:  Current Outpatient Medications  Medication Sig Dispense Refill   aspirin EC 81 MG tablet Take 81 mg by mouth daily.     buPROPion (WELLBUTRIN XL) 150 MG 24 hr tablet TAKE 1 TABLET BY MOUTH  DAILY 90 tablet 1   busPIRone (BUSPAR) 15 MG tablet Take 15 mg by mouth 3 (three) times daily.     calcium carbonate (TUMS - DOSED IN MG ELEMENTAL CALCIUM) 500 MG chewable tablet Chew 1 tablet by mouth 3 (three) times daily with meals.     cholecalciferol (VITAMIN D) 1000 units tablet Take 1,000 Units by mouth daily.     DULoxetine (CYMBALTA) 60 MG capsule TAKE 1 CAPSULE BY MOUTH  DAILY 90 capsule 1   gabapentin (NEURONTIN) 300 MG capsule Take 300 mg by mouth 3 (three) times daily.     hydrochlorothiazide (HYDRODIURIL) 25 MG tablet TAKE 1 TABLET BY MOUTH  DAILY 90 tablet 3   lidocaine-prilocaine (EMLA) cream Apply on the port. 30 -45 min  prior to port access. 30 g 3   losartan (COZAAR) 50 MG tablet TAKE 1 TABLET BY MOUTH  DAILY 90 tablet 3   ondansetron (ZOFRAN) 4 MG tablet Take 1 tablet (4 mg total) by mouth every 6 (six) hours as needed for nausea. 20 tablet 0   ondansetron (ZOFRAN) 8 MG tablet One pill every 8 hours as needed for nausea/vomitting. 40 tablet 1   potassium chloride SA (KLOR-CON M) 20 MEQ tablet 1 pill twice a day 60 tablet 1   prochlorperazine (COMPAZINE) 10 MG tablet Take 1 tablet (10 mg total) by mouth every 6 (six) hours as needed for nausea or vomiting. 40 tablet 1   traMADol (ULTRAM) 50 MG tablet Take 1 tablet (50 mg total) by mouth every 12 (twelve) hours as needed. 30 tablet 0   traZODone (DESYREL) 50 MG tablet Take 1-2 tablets at bedtime for sleep. 180 tablet 1   vitamin B-12 (CYANOCOBALAMIN) 500 MCG tablet Take 500 mcg by mouth daily.     ALPRAZolam (XANAX) 0.5 MG tablet Take  1 tablet (0.5 mg total) by mouth 2 (two) times daily as needed for anxiety. (Patient not taking: Reported on 03/22/2022) 15 tablet 0   colestipol (COLESTID) 1 g tablet TAKE 1 TABLET BY MOUTH  DAILY (Patient not taking: Reported on 03/22/2022) 90 tablet 0   dronabinol (MARINOL) 2.5 MG capsule Take 1 capsule (2.5  mg total) by mouth 2 (two) times daily before a meal. (Patient not taking: Reported on 03/28/2022) 60 capsule 0   Fluticasone-Umeclidin-Vilant (TRELEGY ELLIPTA) 100-62.5-25 MCG/INH AEPB Inhale 1 puff into the lungs daily.  (Patient not taking: Reported on 03/22/2022)     rosuvastatin (CRESTOR) 5 MG tablet Take 5 mg by mouth 3 (three) times a week.     No current facility-administered medications for this visit.    PHYSICAL EXAMINATION: ECOG PERFORMANCE STATUS: 1 - Symptomatic but completely ambulatory  BP (!) 146/48 (BP Location: Left Arm, Patient Position: Sitting, Cuff Size: Large)   Pulse 88   Temp 98.7 F (37.1 C) (Tympanic)   Ht '5\' 1"'  (1.549 m)   Wt 188 lb 9.6 oz (85.5 kg)   SpO2 97%   BMI 35.64 kg/m   Filed Weights   03/28/22 1431  Weight: 188 lb 9.6 oz (85.5 kg)     Physical Exam Vitals and nursing note reviewed.  HENT:     Head: Normocephalic and atraumatic.     Mouth/Throat:     Pharynx: Oropharynx is clear.  Eyes:     Extraocular Movements: Extraocular movements intact.     Pupils: Pupils are equal, round, and reactive to light.  Cardiovascular:     Rate and Rhythm: Normal rate and regular rhythm.  Pulmonary:     Comments: Decreased breath sounds bilaterally.  Abdominal:     Palpations: Abdomen is soft.  Musculoskeletal:        General: Normal range of motion.     Cervical back: Normal range of motion.  Skin:    General: Skin is warm.  Neurological:     General: No focal deficit present.     Mental Status: She is alert and oriented to person, place, and time.  Psychiatric:        Behavior: Behavior normal.        Judgment: Judgment normal.        LABORATORY DATA:  I have reviewed the data as listed    Component Value Date/Time   NA 132 (L) 03/22/2022 1451   NA 141 01/30/2020 1044   K 4.1 03/22/2022 1451   CL 95 (L) 03/22/2022 1451   CO2 28 03/22/2022 1451   GLUCOSE 107 (H) 03/22/2022 1451   BUN 11 03/22/2022 1451   BUN 9 01/30/2020 1044   CREATININE 1.13 (H) 03/22/2022 1451   CREATININE 0.86 02/27/2012 0912   CALCIUM 8.4 (L) 03/22/2022 1451   PROT 7.3 03/22/2022 1451   PROT 7.0 01/30/2020 1044   ALBUMIN 3.2 (L) 03/22/2022 1451   ALBUMIN 4.3 01/30/2020 1044   AST 18 03/22/2022 1451   ALT 11 03/22/2022 1451   ALKPHOS 159 (H) 03/22/2022 1451   BILITOT 0.5 03/22/2022 1451   BILITOT 0.4 01/30/2020 1044   GFRNONAA 50 (L) 03/22/2022 1451   GFRNONAA >60 02/27/2012 0912   GFRAA 74 01/30/2020 1044   GFRAA >60 02/27/2012 0912    No results found for: "SPEP", "UPEP"  Lab Results  Component Value Date   WBC 7.9 03/28/2022   NEUTROABS 5.9 03/28/2022   HGB 10.7 (L) 03/28/2022   HCT 33.0 (L) 03/28/2022   MCV 91.4 03/28/2022   PLT 308 03/28/2022      Chemistry      Component Value Date/Time   NA 132 (L) 03/22/2022 1451   NA 141 01/30/2020 1044   K 4.1 03/22/2022 1451   CL 95 (L) 03/22/2022 1451   CO2 28 03/22/2022 1451   BUN  11 03/22/2022 1451   BUN 9 01/30/2020 1044   CREATININE 1.13 (H) 03/22/2022 1451   CREATININE 0.86 02/27/2012 0912      Component Value Date/Time   CALCIUM 8.4 (L) 03/22/2022 1451   ALKPHOS 159 (H) 03/22/2022 1451   AST 18 03/22/2022 1451   ALT 11 03/22/2022 1451   BILITOT 0.5 03/22/2022 1451   BILITOT 0.4 01/30/2020 1044       RADIOGRAPHIC STUDIES: I have personally reviewed the radiological images as listed and agreed with the findings in the report. No results found.   ASSESSMENT & PLAN:  Cholangiocarcinoma of liver ([place]) #Cholangiocarcinoma stage IV-insufficient tissue for NGS; possibly MMR testing.  Liquid biopsy guardant testing; Currently on  gemcitabine [862m/m2  day 1-8] cisplatin [ 30 mg per metered square day 1 day 8] with durvalumab every 21 days.   #Proceed with cycle #2; day 1 cis- gemcitabine-durva  tomorrow. Labs today reviewed;  acceptable for treatment today. GFR- 50; monitor labs closely on cisplatin.  # Nausea- G-1- on Zofran prn every other day.   # Myalgias/back pain- sec to Udenyca s/p Claritin x7 days [not helped]; okay to use tramadol as needed.  # Electrolyte abnoralities: Hypocalcemia: Calcium 8.5-add calcium vitamin D: Hypomagnesemia-mag 1.6; continue 2 g of mag premedications; hypokalemia-potassium 4.1 continue.  #Obstructive jaundice status post ERCP status post stenting; LFTs/bilirubin improved.  Monitor closely. STABLE.   #Transition of care: [place]  As per the family patient has an appointment with oncologist in [place]-cycle #2 week #3.   # IV access: s/p MediPort- functional.    Cycle- 1; gem-800/cis-30; durva  # DISPOSITION: # chemo as planned tomorrow- gem-cis-durvalumab.  # 1 week- MD: labs- cbc/cmp;Ca-19-9; magnesium;chemo- gem-cis; 2gm of Magnesium;  D-2 udenyca   No orders of the defined types were placed in this encounter.  All questions were answered. The patient knows to call the clinic with any problems, questions or concerns.      GCammie Sickle MD 03/28/2022 3:16 PM

## 2022-03-29 ENCOUNTER — Encounter: Payer: Self-pay | Admitting: Internal Medicine

## 2022-03-29 ENCOUNTER — Ambulatory Visit: Payer: Medicare Other

## 2022-03-29 ENCOUNTER — Other Ambulatory Visit: Payer: Medicare Other

## 2022-03-29 ENCOUNTER — Ambulatory Visit: Payer: Medicare Other | Admitting: Internal Medicine

## 2022-03-29 LAB — CANCER ANTIGEN 19-9: CA 19-9: 2619 U/mL — ABNORMAL HIGH (ref 0–35)

## 2022-03-29 MED ORDER — SLOW MAGNESIUM/CALCIUM 70-117 MG PO TBEC: 1.0000 | DELAYED_RELEASE_TABLET | Freq: Two times a day (BID) | ORAL | 6 refills | Status: AC

## 2022-03-30 ENCOUNTER — Telehealth: Payer: Self-pay | Admitting: *Deleted

## 2022-03-30 ENCOUNTER — Inpatient Hospital Stay: Payer: Medicare Other

## 2022-03-30 ENCOUNTER — Ambulatory Visit: Payer: Medicare Other

## 2022-03-30 ENCOUNTER — Other Ambulatory Visit: Payer: Medicare Other

## 2022-03-30 VITALS — BP 129/75 | HR 97 | Temp 99.8°F | Resp 18

## 2022-03-30 DIAGNOSIS — Z5112 Encounter for antineoplastic immunotherapy: Secondary | ICD-10-CM | POA: Diagnosis not present

## 2022-03-30 DIAGNOSIS — C221 Intrahepatic bile duct carcinoma: Secondary | ICD-10-CM

## 2022-03-30 MED ORDER — SODIUM CHLORIDE 0.9 % IV SOLN
150.0000 mg | Freq: Once | INTRAVENOUS | Status: AC
  Administered 2022-03-30: 150 mg via INTRAVENOUS
  Filled 2022-03-30: qty 150

## 2022-03-30 MED ORDER — HEPARIN SOD (PORK) LOCK FLUSH 100 UNIT/ML IV SOLN
INTRAVENOUS | Status: AC
  Filled 2022-03-30: qty 5

## 2022-03-30 MED ORDER — SODIUM CHLORIDE 0.9 % IV SOLN
10.0000 mg | Freq: Once | INTRAVENOUS | Status: AC
  Administered 2022-03-30: 10 mg via INTRAVENOUS
  Filled 2022-03-30: qty 10

## 2022-03-30 MED ORDER — PALONOSETRON HCL INJECTION 0.25 MG/5ML
0.2500 mg | Freq: Once | INTRAVENOUS | Status: AC
  Administered 2022-03-30: 0.25 mg via INTRAVENOUS
  Filled 2022-03-30: qty 5

## 2022-03-30 MED ORDER — SODIUM CHLORIDE 0.9 % IV SOLN
Freq: Once | INTRAVENOUS | Status: AC
  Filled 2022-03-30: qty 250

## 2022-03-30 MED ORDER — HEPARIN SOD (PORK) LOCK FLUSH 100 UNIT/ML IV SOLN
500.0000 [IU] | Freq: Once | INTRAVENOUS | Status: AC | PRN
  Administered 2022-03-30: 500 [IU]
  Filled 2022-03-30: qty 5

## 2022-03-30 MED ORDER — SODIUM CHLORIDE 0.9 % IV SOLN
1000.0000 mg/m2 | Freq: Once | INTRAVENOUS | Status: AC
  Administered 2022-03-30: 1938 mg via INTRAVENOUS
  Filled 2022-03-30: qty 50.97

## 2022-03-30 MED ORDER — MAGNESIUM SULFATE 2 GM/50ML IV SOLN
2.0000 g | Freq: Once | INTRAVENOUS | Status: AC
  Administered 2022-03-30: 2 g via INTRAVENOUS
  Filled 2022-03-30: qty 50

## 2022-03-30 NOTE — Telephone Encounter (Signed)
Also received prior auth request for Dronabinol 2.5 mg.  Will hold on submitting PA on 2.5 mg dose pending possible dose change.

## 2022-03-30 NOTE — Telephone Encounter (Signed)
[facility] called stating that Dronabinol 2.5 mg is on short term back order and that the only strength they have is 10 mg tabs Asking if you want to order that and have them quarter it or change to something else. Please advise

## 2022-03-30 NOTE — Progress Notes (Signed)
0830- Patient reports diarrhea (6-7 times) during the day yesterday, 03/29/2022. Patient reports she feels alright today and has not had any diarrhea overnight or today so far. Patient denies taking Imodium. Vital signs stable. MD, Dr. Jacinto Reap, notified.   0836- Per MD, Dr. Jacinto Reap, order: Okay to proceed with Gemzar and Magnesium Sulfate treatment as scheduled today. Also, per MD order: Patient educated for any future instances of diarrhea to take Imodium 1 tablet after each bowel movement up to 4 a day and if this is not helping to notify clinic; also educated to continue to hydrate with water/Gatorade. Patient verbalized understanding.

## 2022-03-30 NOTE — Telephone Encounter (Signed)
[facility] stated they have 2.'5mg'$  in stock if you can send signed order there

## 2022-03-30 NOTE — Telephone Encounter (Signed)
PA request submitted via Cover My Meds ([employer] [place] - PA Case ID: [id number])

## 2022-03-30 NOTE — Patient Instructions (Addendum)
Take one Imodium tablet after every bowel movement up to four times a day for diarrhea. Be sure to drink plenty of fluids with electrolytes like Gatorade. If diarrhea continues be sure to make MD aware.     [facility] [place]   Discharge Instructions: Thank you for choosing [facility] to provide your oncology and hematology care.  If you have a lab appointment with the [facility], please go directly to the [facility] and check in at the registration area.  Wear comfortable clothing and clothing appropriate for easy access to any Portacath or PICC line.   We strive to give you quality time with your provider. You may need to reschedule your appointment if you arrive late (15 or more minutes).  Arriving late affects you and other patients whose appointments are after yours.  Also, if you miss three or more appointments without notifying the office, you may be dismissed from the clinic at the provider's discretion.      For prescription refill requests, have your pharmacy contact our office and allow 72 hours for refills to be completed.    Today you received the following chemotherapy and/or immunotherapy agents Gemzar      To help prevent nausea and vomiting after your treatment, we encourage you to take your nausea medication as directed.  BELOW ARE SYMPTOMS THAT SHOULD BE REPORTED IMMEDIATELY: *FEVER GREATER THAN 100.4 F (38 C) OR HIGHER *CHILLS OR SWEATING *NAUSEA AND VOMITING THAT IS NOT CONTROLLED WITH YOUR NAUSEA MEDICATION *UNUSUAL SHORTNESS OF BREATH *UNUSUAL BRUISING OR BLEEDING *URINARY PROBLEMS (pain or burning when urinating, or frequent urination) *BOWEL PROBLEMS (unusual diarrhea, constipation, pain near the anus) TENDERNESS IN MOUTH AND THROAT WITH OR WITHOUT PRESENCE OF ULCERS (sore throat, sores in mouth, or a toothache) UNUSUAL RASH, SWELLING OR PAIN  UNUSUAL VAGINAL DISCHARGE OR ITCHING   Items with * indicate a  potential emergency and should be followed up as soon as possible or go to the Emergency Department if any problems should occur.  Please show the CHEMOTHERAPY ALERT CARD or IMMUNOTHERAPY ALERT CARD at check-in to the Emergency Department and triage nurse.  Should you have questions after your visit or need to cancel or reschedule your appointment, please contact [facility] [place] [facility] [place]  [phone] and follow the prompts.  Office hours are 8:00 a.m. to 4:30 p.m. Monday - Friday. Please note that voicemails left after 4:00 p.m. may not be returned until the following business day.  We are closed weekends and major holidays. You have access to a nurse at all times for urgent questions. Please call the main number to the clinic [phone] and follow the prompts.  For any non-urgent questions, you may also contact your provider using MyChart. We now offer e-Visits for anyone 9 and older to request care online for non-urgent symptoms. For details visit [email or web address].   Also download the MyChart app! Go to the app store, search "MyChart", open the app, select [facility], and log in with your MyChart username and password.  Masks are optional in the cancer centers. If you would like for your care team to wear a mask while they are taking care of you, please let them know. For doctor visits, patients may have with them one support person who is at least 77 years old. At this time, visitors are not allowed in the infusion area.

## 2022-03-31 ENCOUNTER — Encounter: Payer: Self-pay | Admitting: Internal Medicine

## 2022-03-31 ENCOUNTER — Inpatient Hospital Stay: Payer: Medicare Other

## 2022-03-31 NOTE — Telephone Encounter (Signed)
Josh would like for patient to be informed that med PA was denied, no appeal request

## 2022-03-31 NOTE — Telephone Encounter (Signed)
Called informed patient of denial, she states nausea has pretty much been well controlled but she will rotate between Zofran and Compazine. Inform her to call the office if anything else is needed.

## 2022-03-31 NOTE — Telephone Encounter (Addendum)
PA was denied.  Would you like to file an appeal?

## 2022-04-14 ENCOUNTER — Encounter: Payer: Self-pay | Admitting: Internal Medicine

## 2022-04-27 ENCOUNTER — Encounter: Payer: Self-pay | Admitting: Internal Medicine

## 2022-04-27 ENCOUNTER — Telehealth: Payer: Self-pay | Admitting: Internal Medicine

## 2022-04-27 NOTE — Telephone Encounter (Signed)
Spoke to pts daughter re: results of the PET scan.  Discussed re: continuation of current therapy  vs. Repeat Bx; vs. Local therapies- with IR.  Continue follow up [place] in [place]

## 2022-05-11 ENCOUNTER — Other Ambulatory Visit: Payer: Self-pay

## 2022-06-29 ENCOUNTER — Other Ambulatory Visit: Payer: Self-pay

## 2022-07-27 ENCOUNTER — Other Ambulatory Visit: Payer: Self-pay

## 2022-08-29 ENCOUNTER — Other Ambulatory Visit: Payer: Self-pay

## 2023-01-31 ENCOUNTER — Telehealth: Payer: Self-pay

## 2023-02-01 NOTE — Telephone Encounter (Signed)
I do not have access to parachute. This is not our patient. Ok to decline order if able to take a verbal order. Not sure what happens if we just ignore the request if unable to decline.

## 2023-02-07 ENCOUNTER — Other Ambulatory Visit: Payer: Self-pay

## 2023-04-05 ENCOUNTER — Other Ambulatory Visit: Payer: Self-pay

## 2023-10-17 DEATH — deceased
